# Patient Record
Sex: Female | Born: 1940 | Race: Black or African American | Hispanic: No | Marital: Single | Smoking: Former smoker
Health system: Southern US, Community
[De-identification: ages and names within clinical notes are randomized; demographics above are authoritative.]

## PROBLEM LIST (undated history)

## (undated) DIAGNOSIS — C719 Malignant neoplasm of brain, unspecified: Secondary | ICD-10-CM

## (undated) DIAGNOSIS — S8990XA Unspecified injury of unspecified lower leg, initial encounter: Secondary | ICD-10-CM

## (undated) DIAGNOSIS — E785 Hyperlipidemia, unspecified: Secondary | ICD-10-CM

## (undated) DIAGNOSIS — R569 Unspecified convulsions: Secondary | ICD-10-CM

## (undated) DIAGNOSIS — I10 Essential (primary) hypertension: Secondary | ICD-10-CM

## (undated) DIAGNOSIS — C3490 Malignant neoplasm of unspecified part of unspecified bronchus or lung: Secondary | ICD-10-CM

## (undated) HISTORY — DX: Malignant neoplasm of unspecified part of unspecified bronchus or lung: C34.90

## (undated) HISTORY — DX: Malignant neoplasm of brain, unspecified: C71.9

---

## 1981-05-24 DIAGNOSIS — S8990XA Unspecified injury of unspecified lower leg, initial encounter: Secondary | ICD-10-CM

## 1981-05-24 HISTORY — PX: ABDOMINAL HYSTERECTOMY: SHX81

## 1981-05-24 HISTORY — DX: Unspecified injury of unspecified lower leg, initial encounter: S89.90XA

## 2014-04-18 ENCOUNTER — Emergency Department (HOSPITAL_COMMUNITY): Payer: Medicare Other

## 2014-04-18 ENCOUNTER — Inpatient Hospital Stay (HOSPITAL_COMMUNITY)
Admission: EM | Admit: 2014-04-18 | Discharge: 2014-04-20 | DRG: 989 | Disposition: A | Discharge status: Home / Self Care | Payer: Medicare Other | Attending: Family Medicine | Admitting: Family Medicine

## 2014-04-18 ENCOUNTER — Encounter (HOSPITAL_COMMUNITY): Payer: Self-pay

## 2014-04-18 DIAGNOSIS — Z79899 Other long term (current) drug therapy: Secondary | ICD-10-CM | POA: Diagnosis not present

## 2014-04-18 DIAGNOSIS — R569 Unspecified convulsions: Secondary | ICD-10-CM

## 2014-04-18 DIAGNOSIS — R200 Anesthesia of skin: Secondary | ICD-10-CM

## 2014-04-18 DIAGNOSIS — I10 Essential (primary) hypertension: Secondary | ICD-10-CM | POA: Diagnosis not present

## 2014-04-18 DIAGNOSIS — Z87891 Personal history of nicotine dependence: Secondary | ICD-10-CM | POA: Diagnosis not present

## 2014-04-18 DIAGNOSIS — C801 Malignant (primary) neoplasm, unspecified: Secondary | ICD-10-CM | POA: Diagnosis not present

## 2014-04-18 DIAGNOSIS — E785 Hyperlipidemia, unspecified: Secondary | ICD-10-CM | POA: Diagnosis not present

## 2014-04-18 DIAGNOSIS — Z7982 Long term (current) use of aspirin: Secondary | ICD-10-CM | POA: Diagnosis not present

## 2014-04-18 DIAGNOSIS — C7931 Secondary malignant neoplasm of brain: Principal | ICD-10-CM | POA: Diagnosis present

## 2014-04-18 DIAGNOSIS — C799 Secondary malignant neoplasm of unspecified site: Secondary | ICD-10-CM

## 2014-04-18 DIAGNOSIS — Z9889 Other specified postprocedural states: Secondary | ICD-10-CM

## 2014-04-18 DIAGNOSIS — J984 Other disorders of lung: Secondary | ICD-10-CM

## 2014-04-18 HISTORY — DX: Essential (primary) hypertension: I10

## 2014-04-18 HISTORY — DX: Hyperlipidemia, unspecified: E78.5

## 2014-04-18 LAB — CBC WITH DIFFERENTIAL/PLATELET
BASOS PCT: 0 % (ref 0–1)
Basophils Absolute: 0 10*3/uL (ref 0.0–0.1)
EOS PCT: 4 % (ref 0–5)
Eosinophils Absolute: 0.4 10*3/uL (ref 0.0–0.7)
HEMATOCRIT: 36.1 % (ref 36.0–46.0)
HEMOGLOBIN: 12.5 g/dL (ref 12.0–15.0)
LYMPHS ABS: 2 10*3/uL (ref 0.7–4.0)
Lymphocytes Relative: 22 % (ref 12–46)
MCH: 25.4 pg — ABNORMAL LOW (ref 26.0–34.0)
MCHC: 34.6 g/dL (ref 30.0–36.0)
MCV: 73.4 fL — AB (ref 78.0–100.0)
MONOS PCT: 9 % (ref 3–12)
Monocytes Absolute: 0.8 10*3/uL (ref 0.1–1.0)
NEUTROS ABS: 6.1 10*3/uL (ref 1.7–7.7)
Neutrophils Relative %: 65 % (ref 43–77)
Platelets: 281 10*3/uL (ref 150–400)
RBC: 4.92 MIL/uL (ref 3.87–5.11)
RDW: 13.9 % (ref 11.5–15.5)
WBC: 9.3 10*3/uL (ref 4.0–10.5)

## 2014-04-18 LAB — COMPREHENSIVE METABOLIC PANEL
ALT: 11 U/L (ref 0–35)
AST: 14 U/L (ref 0–37)
Albumin: 3.1 g/dL — ABNORMAL LOW (ref 3.5–5.2)
Alkaline Phosphatase: 73 U/L (ref 39–117)
Anion gap: 13 (ref 5–15)
BILIRUBIN TOTAL: 0.3 mg/dL (ref 0.3–1.2)
BUN: 10 mg/dL (ref 6–23)
CHLORIDE: 100 meq/L (ref 96–112)
CO2: 27 meq/L (ref 19–32)
CREATININE: 0.77 mg/dL (ref 0.50–1.10)
Calcium: 8.5 mg/dL (ref 8.4–10.5)
GFR calc Af Amer: >90 mL/min (ref >90)
GFR, EST NON AFRICAN AMERICAN: 81 mL/min — AB (ref >90)
Glucose, Bld: 110 mg/dL — ABNORMAL HIGH (ref 70–99)
Potassium: 3.2 mEq/L — ABNORMAL LOW (ref 3.7–5.3)
Sodium: 140 mEq/L (ref 137–147)
Total Protein: 7.1 g/dL (ref 6.0–8.3)

## 2014-04-18 LAB — URINALYSIS, ROUTINE W REFLEX MICROSCOPIC
BILIRUBIN URINE: NEGATIVE
GLUCOSE, UA: NEGATIVE mg/dL
Hgb urine dipstick: NEGATIVE
KETONES UR: NEGATIVE mg/dL
Nitrite: NEGATIVE
Protein, ur: NEGATIVE mg/dL
Specific Gravity, Urine: 1.01 (ref 1.005–1.030)
Urobilinogen, UA: 0.2 mg/dL (ref 0.0–1.0)
pH: 6 (ref 5.0–8.0)

## 2014-04-18 LAB — URINE MICROSCOPIC-ADD ON

## 2014-04-18 LAB — TROPONIN I

## 2014-04-18 MED ORDER — LEVETIRACETAM IN NACL 1000 MG/100ML IV SOLN
1000.0000 mg | Freq: Once | INTRAVENOUS | Status: AC
  Administered 2014-04-18: 1000 mg via INTRAVENOUS
  Filled 2014-04-18: qty 100

## 2014-04-18 MED ORDER — ATORVASTATIN CALCIUM 10 MG PO TABS
10.0000 mg | ORAL_TABLET | Freq: Every day | ORAL | Status: DC
  Filled 2014-04-18: qty 1

## 2014-04-18 MED ORDER — HYDROCHLOROTHIAZIDE 25 MG PO TABS
25.0000 mg | ORAL_TABLET | Freq: Every day | ORAL | Status: DC
  Administered 2014-04-19 – 2014-04-20 (×2): 25 mg via ORAL
  Filled 2014-04-18 (×2): qty 1

## 2014-04-18 MED ORDER — LEVETIRACETAM 500 MG PO TABS
1000.0000 mg | ORAL_TABLET | Freq: Two times a day (BID) | ORAL | Status: DC
  Administered 2014-04-19 – 2014-04-20 (×4): 1000 mg via ORAL
  Filled 2014-04-18 (×5): qty 2

## 2014-04-18 MED ORDER — DEXAMETHASONE SODIUM PHOSPHATE 10 MG/ML IJ SOLN
10.0000 mg | Freq: Once | INTRAMUSCULAR | Status: AC
  Administered 2014-04-18: 10 mg via INTRAVENOUS
  Filled 2014-04-18: qty 1

## 2014-04-18 MED ORDER — SENNOSIDES-DOCUSATE SODIUM 8.6-50 MG PO TABS: 1.0000 | ORAL_TABLET | Freq: Every evening | ORAL | Status: DC | PRN

## 2014-04-18 MED ORDER — POTASSIUM CHLORIDE CRYS ER 20 MEQ PO TBCR
20.0000 meq | EXTENDED_RELEASE_TABLET | Freq: Once | ORAL | Status: AC
  Administered 2014-04-19: 20 meq via ORAL
  Filled 2014-04-18: qty 1

## 2014-04-18 MED ORDER — STROKE: EARLY STAGES OF RECOVERY BOOK
Freq: Once | Status: AC
Start: 2014-04-19 — End: 2014-04-19
  Administered 2014-04-19: 01:00:00
  Filled 2014-04-18: qty 1

## 2014-04-18 MED ORDER — VITAMIN D 1000 UNITS PO TABS
1000.0000 [IU] | ORAL_TABLET | Freq: Every day | ORAL | Status: DC
  Administered 2014-04-19 – 2014-04-20 (×2): 1000 [IU] via ORAL
  Filled 2014-04-18 (×2): qty 1

## 2014-04-18 NOTE — ED Notes (Signed)
Pt called for RN. Pt complaining of "feeling like she's about to start shaking." RN to bedside. Pt began to shake on left side. HR tachy in 140's. Pt responsive to commands, maintaining airway. MD to bedside. Once shaking subsided, pt alert/oriented x4. Able to recall event in detail. Pt denies any pain.

## 2014-04-18 NOTE — ED Provider Notes (Signed)
CSN: [id number]     Arrival date & time 04/18/14  1941 History   First MD Initiated Contact with Patient 04/18/14 1945     Chief Complaint  Patient presents with  . Numbness     (Consider location/radiation/quality/duration/timing/severity/associated sxs/prior Treatment) Patient is a 73 y.o. female presenting with neurologic complaint.  Neurologic Problem This is a new problem. The current episode started more than 1 month ago. The problem occurs constantly. The problem has been gradually worsening. Pertinent negatives include no abdominal pain, chest pain, congestion, coughing, fever, headaches or joint swelling.    Past Medical History  Diagnosis Date  . Hypertension   . Hyperlipidemia    History reviewed. No pertinent past surgical history. History reviewed. No pertinent family history. History  Substance Use Topics  . Smoking status: Former Research scientist (life sciences)  . Smokeless tobacco: Current User  . Alcohol Use: Yes     Comment: 2 oz/week   OB History    No data available     Review of Systems  Constitutional: Negative for fever and activity change.  HENT: Negative for congestion and facial swelling.   Eyes: Negative for discharge and redness.  Respiratory: Negative for cough and shortness of breath.   Cardiovascular: Negative for chest pain and palpitations.  Gastrointestinal: Negative for abdominal pain and abdominal distention.  Endocrine: Negative for polydipsia and polyuria.  Genitourinary: Negative for dysuria and menstrual problem.  Musculoskeletal: Negative for back pain and joint swelling.  Skin: Negative for color change and wound.  Neurological: Negative for dizziness, light-headedness and headaches.  All other systems reviewed and are negative.     Allergies  Review of patient's allergies indicates no known allergies.  Home Medications   Prior to Admission medications   Medication Sig Start Date End Date Taking? Authorizing Provider  aspirin 81 MG tablet  Take 81 mg by mouth daily.   Yes Historical Provider, MD  atorvastatin (LIPITOR) 10 MG tablet Take 10 mg by mouth daily. 02/18/14  Yes Historical Provider, MD  cholecalciferol (VITAMIN D) 1000 UNITS tablet Take 1,000 Units by mouth daily.   Yes Historical Provider, MD  hydrochlorothiazide (HYDRODIURIL) 25 MG tablet Take 25 mg by mouth daily.   Yes Historical Provider, MD   BP 105/88 mmHg  Pulse 81  Temp(Src) 98.2 F (36.8 C) (Oral)  Resp 17  SpO2 98% Physical Exam  Constitutional: She is oriented to person, place, and time. She appears well-developed and well-nourished.  HENT:  Head: Normocephalic and atraumatic.  Eyes: Conjunctivae and EOM are normal. Right eye exhibits no discharge. Left eye exhibits no discharge.  Cardiovascular: Normal rate and regular rhythm.   Pulmonary/Chest: Effort normal and breath sounds normal. No respiratory distress.  Abdominal: Soft. She exhibits no distension. There is no tenderness. There is no rebound.  Musculoskeletal: Normal range of motion. She exhibits no edema or tenderness.  Neurological: She is alert and oriented to person, place, and time.  No altered mental status, able to give full seemingly accurate history.  Face is symmetric, EOM's intact, pupils equal and reactive, vision intact, tongue and uvula midline without deviation Upper and Lower extremity motor 5/5, intact pain perception in distal extremities, 2+ reflexes in biceps, patella and achilles tendons. Abnormal sensation to left side of torso front and back. Finger to nose normal, heel to shin normal. Walks without assistance or evident ataxia.   Skin: Skin is warm and dry.  Nursing note and vitals reviewed.   ED Course  Procedures (including critical care time) Labs  Review Labs Reviewed  CBC WITH DIFFERENTIAL - Abnormal; Notable for the following:    MCV 73.4 (*)    MCH 25.4 (*)    All other components within normal limits  COMPREHENSIVE METABOLIC PANEL - Abnormal; Notable for the  following:    Potassium 3.2 (*)    Glucose, Bld 110 (*)    Albumin 3.1 (*)    GFR calc non Af Amer 81 (*)    All other components within normal limits  URINALYSIS, ROUTINE W REFLEX MICROSCOPIC - Abnormal; Notable for the following:    Leukocytes, UA TRACE (*)    All other components within normal limits  TROPONIN I  URINE MICROSCOPIC-ADD ON  HEMOGLOBIN A1C  LIPID PANEL    Imaging Review Dg Chest 2 View  04/18/2014   CLINICAL DATA:  Difficulty breathing  EXAM: CHEST  2 VIEW  COMPARISON:  April 10, 2014  FINDINGS: There is persistent left lower lobe consolidation as well as left base atelectasis. Right lung is clear. The heart size is normal. There is fullness in the hilar regions bilaterally, somewhat more on the left than on the right. There is atherosclerotic change in aorta. No bone lesions.  IMPRESSION: Persistent left lower lobe consolidation. Findings concerning for potential adenopathy. Given the persistence of the opacity in the left lower lobe and concern for potential adenopathy, contrast enhanced chest CT advised to assess for adenopathy as well as a possible endobronchial lesion on the left. Overall, there is no new opacity compared to prior study.   Electronically Signed   By: Lowella Grip M.D.   On: 04/18/2014 21:22   Ct Head Wo Contrast  04/18/2014   CLINICAL DATA:  Left sided tremors for past 5 hr.  EXAM: CT HEAD WITHOUT CONTRAST  TECHNIQUE: Contiguous axial images were obtained from the base of the skull through the vertex without intravenous contrast.  COMPARISON:  None.  FINDINGS: The ventricles are normal in size and configuration. 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 No other hemorrhage is seen. There is no mass, extra-axial fluid, or midline shift. There is a questionable second recent and possibly acute infarct in the posterior right Crohn radiata. There is small vessel  disease in the periventricular white matter more inferiorly, particularly involving both internal capsules in the anterior limb of the right external capsule. Bony calvarium appears intact. The mastoid air cells are clear.  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 Question second acute infarct in the posterior right corona radiata. No hemorrhage is seen in this area. There is patchy small vessel disease in the basal ganglia regions and inferior periventricular white matter bilaterally.  Critical Value/emergent results were called by telephone at the time of interpretation on 04/18/2014 at 9:32 pm to Dr. Merrily Pew , who verbally acknowledged these results.   Electronically Signed   By: Lowella Grip M.D.   On: 04/18/2014 21:33     EKG Interpretation   Date/Time:  Thursday April 18 2014 19:49:06 EST Ventricular Rate:  96 PR Interval:  198 QRS Duration: 92 QT Interval:  369 QTC Calculation: 466 R Axis:   67 Text Interpretation:  Sinus rhythm Atrial premature complex Consider left  atrial enlargement Borderline T abnormalities, lateral leads Baseline  wander in lead(s) I III aVL V2 Confirmed by Hazle Coca [id number]) on  04/18/2014 9:11:04 PM  MDM   Final diagnoses:  Numbness    73 yo F w/ htn and hld here with a couple months of left body change in sensation and two episodes of shaking of left body as well, both resolved with ASA, most recent today. No HA, pain, fevers, CP or other symptoms. Exam void of objective findings, as above. Labs and imaging ordered to r/o brain bleed/stroke/mass, also possibly atypical chest pain.  CT found to have left sided ring enhancing lesion read as acute infarct with hemorrhage, neurology consulted and felt it to be more like mass with surrounding edema. Patient had an episode of what appeared to be focal seizure activity in ED  resolved on it's own and no change in responsiveness. Neurology recommended keppra, decadron and medicine admission, those administered and patient admitted to medicine.      Merrily Pew, MD 04/19/14 [id number]  Quintella Reichert, MD 04/19/14 [phone]

## 2014-04-18 NOTE — ED Notes (Signed)
Per EMS: Beginning Nov 5, pt sudden onset numbness in left arm only, refused care. Today, tremors in L arm. No weakness, pain or tingling.

## 2014-04-18 NOTE — H&P (Signed)
[facility]                                                                                    Patient Demographics  Michelle Cobb, is a 73 y.o. female  CSN: [id number]  MRN: [id number]  DOB - [birth year]  Admit Date - 04/18/2014  Outpatient Primary MD for the patient is Woody Seller, MD   With History of -  Past Medical History  Diagnosis Date  . Hypertension   . Hyperlipidemia       History reviewed. No pertinent past surgical history.  in for   Chief Complaint  Patient presents with  . Numbness     HPI  Michelle Cobb  is a 73 y.o. female, past medical history significant for hypertension and hyperlipidemia presenting with left sided numbness and left arm shaking without any mental status changes, blurring of vision, chest pains or palpitations. No nausea or vomiting. No recent history of headaches or dizziness. CT of the brain showed lesions with vasogenic edema suggestive of brain metastasis, and discussed with neurology also suggested metastatic workup for a primary lesion. Chest x-ray showed left lower lobe abnormality . The episode lasted for around 5 minutes and was witnessed in the emergency room again. The patient recalled a similar episode in November 5 that went away and never recurred until today.    Review of Systems    In addition to the HPI above,  No Fever-chills, No Headache, No changes with Vision or hearing, No problems swallowing food or Liquids, No Chest pain, Cough or Shortness of Breath, No Abdominal pain, No Nausea or Vommitting, Bowel movements are regular, No Blood in stool or Urine, No dysuria, No new skin rashes or bruises, No new joints pains-aches,  No recent weight gain or loss, No polyuria, polydypsia or polyphagia, No significant Mental Stressors.  A full 10 point Review of Systems was done, except as stated above, all other Review of Systems were negative.   Social History History  Substance Use  Topics  . Smoking status: Former Research scientist (life sciences)  . Smokeless tobacco: Current User  . Alcohol Use: Yes     Comment: 2 oz/week     Family History Significant for coronary artery disease and alcoholism in her father  Prior to Admission medications   Medication Sig Start Date End Date Taking? Authorizing Provider  aspirin 81 MG tablet Take 81 mg by mouth daily.   Yes Historical Provider, MD  atorvastatin (LIPITOR) 10 MG tablet Take 10 mg by mouth daily. 02/18/14  Yes Historical Provider, MD  cholecalciferol (VITAMIN D) 1000 UNITS tablet Take 1,000 Units by mouth daily.   Yes Historical Provider, MD  hydrochlorothiazide (HYDRODIURIL) 25 MG tablet Take 25 mg by mouth daily.   Yes Historical Provider, MD    No Known Allergies  Physical Exam  Vitals  Blood pressure 126/74, pulse 74, temperature 98.3 F (36.8 C), temperature source Oral, resp. rate 19, SpO2 94 %.   1. General a very pleasant well developed well nourished female in no acute distress  2. Normal affect and insight, Not Suicidal or Homicidal, Awake Alert, Oriented X 3.  3. No F.N deficits, ALL  C.Nerves Intact, Strength 5/5 all 4 extremities, numbness still present in the left shoulder and arm  4. Ears and Eyes appear Normal, Conjunctivae clear, PERRLA. Moist Oral Mucosa.  5. Supple Neck, No JVD, No cervical lymphadenopathy appriciated, No Carotid Bruits.  6. Symmetrical Chest wall movement, Good air movement bilaterally, CTAB.  7. RRR, No Gallops, Rubs or Murmurs, No Parasternal Heave.  8. Positive Bowel Sounds, Abdomen Soft, Non tender, No organomegaly appriciated,No rebound -guarding or rigidity.  9.  No Cyanosis, Normal Skin Turgor, No Skin Rash or Bruise.  10. Good muscle tone,  joints appear normal , no effusions, Normal ROM.  11. No Palpable Lymph Nodes in Neck or Axillae    Data Review  CBC  Recent Labs Lab 04/18/14 2023  WBC 9.3  HGB 12.5  HCT 36.1  PLT 281  MCV 73.4*  MCH 25.4*  MCHC 34.6  RDW  13.9  LYMPHSABS 2.0  MONOABS 0.8  EOSABS 0.4  BASOSABS 0.0   ------------------------------------------------------------------------------------------------------------------  Chemistries   Recent Labs Lab 04/18/14 2023  NA 140  K 3.2*  CL 100  CO2 27  GLUCOSE 110*  BUN 10  CREATININE 0.77  CALCIUM 8.5  AST 14  ALT 11  ALKPHOS 73  BILITOT 0.3   ------------------------------------------------------------------------------------------------------------------ CrCl cannot be calculated (Unknown ideal weight.). ------------------------------------------------------------------------------------------------------------------ No results for input(s): TSH, T4TOTAL, T3FREE, THYROIDAB in the last 72 hours.  Invalid input(s): FREET3   Coagulation profile No results for input(s): INR, PROTIME in the last 168 hours. ------------------------------------------------------------------------------------------------------------------- No results for input(s): DDIMER in the last 72 hours. -------------------------------------------------------------------------------------------------------------------  Cardiac Enzymes  Recent Labs Lab 04/18/14 2023  TROPONINI <0.30   ------------------------------------------------------------------------------------------------------------------ Invalid input(s): POCBNP   ---------------------------------------------------------------------------------------------------------------  Urinalysis No results found for: COLORURINE, APPEARANCEUR, LABSPEC, PHURINE, GLUCOSEU, HGBUR, BILIRUBINUR, KETONESUR, PROTEINUR, UROBILINOGEN, NITRITE, LEUKOCYTESUR  ----------------------------------------------------------------------------------------------------------------     Imaging results:   Dg Chest 2 View  04/18/2014   CLINICAL DATA:  Difficulty breathing  EXAM: CHEST  2 VIEW  COMPARISON:  April 10, 2014  FINDINGS: There is persistent left  lower lobe consolidation as well as left base atelectasis. Right lung is clear. The heart size is normal. There is fullness in the hilar regions bilaterally, somewhat more on the left than on the right. There is atherosclerotic change in aorta. No bone lesions.  IMPRESSION: Persistent left lower lobe consolidation. Findings concerning for potential adenopathy. Given the persistence of the opacity in the left lower lobe and concern for potential adenopathy, contrast enhanced chest CT advised to assess for adenopathy as well as a possible endobronchial lesion on the left. Overall, there is no new opacity compared to prior study.   Electronically Signed   By: Lowella Grip M.D.   On: 04/18/2014 21:22   Ct Head Wo Contrast  04/18/2014   CLINICAL DATA:  Left sided tremors for past 5 hr.  EXAM: CT HEAD WITHOUT CONTRAST  TECHNIQUE: Contiguous axial images were obtained from the base of the skull through the vertex without intravenous contrast.  COMPARISON:  None.  FINDINGS: The ventricles are normal in size and configuration. 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 No other hemorrhage is seen. There is no mass, extra-axial fluid, or midline shift. There is a questionable second recent and possibly acute infarct in the posterior right Crohn radiata. There is small vessel disease in the periventricular white matter more inferiorly, particularly involving both internal capsules in the anterior limb of  the right external capsule. Bony calvarium appears intact. The mastoid air cells are clear.  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 Question second acute infarct in the posterior right corona radiata. No hemorrhage is seen in this area. There is patchy small vessel disease in the basal ganglia regions and  inferior periventricular white matter bilaterally.  Critical Value/emergent results were called by telephone at the time of interpretation on 04/18/2014 at 9:32 pm to Dr. Merrily Pew , who verbally acknowledged these results.   Electronically Signed   By: Lowella Grip M.D.   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 Left lower lobe lung lesion 3. History of hypertension  Plan  Place in telemetry Discussed with neurology Check MRI of brain, CT of the chest and abdomen and pelvis for primary evaluation Start Keppra 1000 mg by mouth twice a day Neurochecks every 4 hours Consult oncology in a.m.   DVT Prophylaxis SCDs  AM Labs Ordered, also please review Full Orders  Code Status full  Disposition Plan: Home  Time spent in minutes : 36 minutes  Condition GUARDED   @SIGNATURE @

## 2014-04-18 NOTE — Consult Note (Signed)
Reason for Consult: Numbness and left upper extremity twitching, and abnormal brain scan.  HPI:                                                                                                                                          Michelle Cobb is an 73 y.o. female with a history of hypertension and hyperlipidemia presenting with complaint of numbness involving left side of her face and left upper extremity for 4-6 weeks and recurrent episodes of involuntary twitching of her left upper extremity. She said no visual changes and no focal weakness. No mental status changes and been noted. She's also had no change in speech nor change in gait. CT scan of the head showed what appeared to be a mass lesion with surrounding vasogenic edema involving the left parietal temporal region, as well as a second area of probable vasogenic edema involving the right corona radiata. 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 She was also started on Decadron and milligrams IV as genic edema.  Past Medical History  Diagnosis Date  . Hypertension   . Hyperlipidemia     Surgical history: Hysterectomy in 1981.  Family history: Positive for myocardial infarction involving her father as well as malignancy of unknown type involving her mother. Family history is otherwise unremarkable.  Social History:  reports that she has quit smoking. She uses smokeless tobacco. She reports that she drinks alcohol. She reports that she does not use illicit drugs.  No Known Allergies  MEDICATIONS:                                                                                                                     I have reviewed the patient's current medications.   ROS:  History obtained from the patient  General ROS: negative for - chills, fatigue, fever,  night sweats, weight gain or weight loss Psychological ROS: negative for - behavioral disorder, hallucinations, memory difficulties, mood swings or suicidal ideation Ophthalmic ROS: negative for - blurry vision, double vision, eye pain or loss of vision ENT ROS: negative for - epistaxis, nasal discharge, oral lesions, sore throat, tinnitus or vertigo Allergy and Immunology ROS: negative for - hives or itchy/watery eyes Hematological and Lymphatic ROS: negative for - bleeding problems, bruising or swollen lymph nodes Endocrine ROS: negative for - galactorrhea, hair pattern changes, polydipsia/polyuria or temperature intolerance Respiratory ROS: negative for - cough, hemoptysis, shortness of breath or wheezing Cardiovascular ROS: negative for - chest pain, dyspnea on exertion, edema or irregular heartbeat Gastrointestinal ROS: negative for - abdominal pain, diarrhea, hematemesis, nausea/vomiting or stool incontinence Genito-Urinary ROS: negative for - dysuria, hematuria, incontinence or urinary frequency/urgency Musculoskeletal ROS: negative for - joint swelling or muscular weakness Neurological ROS: as noted in HPI Dermatological ROS: negative for rash and skin lesion changes   Blood pressure 126/74, pulse 74, temperature 98.3 F (36.8 C), temperature source Oral, resp. rate 19, SpO2 94 %. Appearance was that of a moderately obese elderly lady who was alert and in no acute distress. HEENT: Normal Neck was supple with full range of motion. Heart: Normal rate and rhythm; no murmur.  Neurologic Examination:                                                                                                      Mental Status: Alert, oriented, thought content appropriate.  Speech fluent without evidence of aphasia. Able to follow commands without difficulty. Cranial Nerves: II-Visual fields were normal. III/IV/VI-Pupils were equal and reacted. Extraocular movements were full and conjugate.     V/VII-no facial numbness and no facial weakness. VIII-normal. X-normal speech and symmetrical palatal movement. Motor: 5/5 bilaterally with normal tone and bulk Sensory: Normal throughout. Deep Tendon Reflexes: 1+ and symmetric. Plantars: Mute bilaterally Cerebellar: Normal finger-to-nose testing.  No results found for: CHOL  Results for orders placed or performed during the hospital encounter of 04/18/14 (from the past 48 hour(s))  CBC with Differential     Status: Abnormal   Collection Time: 04/18/14  8:23 PM  Result Value Ref Range   WBC 9.3 4.0 - 10.5 K/uL   RBC 4.92 3.87 - 5.11 MIL/uL   Hemoglobin 12.5 12.0 - 15.0 g/dL   HCT 36.1 36.0 - 46.0 %   MCV 73.4 (L) 78.0 - 100.0 fL   MCH 25.4 (L) 26.0 - 34.0 pg   MCHC 34.6 30.0 - 36.0 g/dL   RDW 13.9 11.5 - 15.5 %   Platelets 281 150 - 400 K/uL   Neutrophils Relative % 65 43 - 77 %   Lymphocytes Relative 22 12 - 46 %   Monocytes Relative 9 3 - 12 %   Eosinophils Relative 4 0 - 5 %   Basophils Relative 0 0 - 1 %   Neutro Abs 6.1 1.7 - 7.7 K/uL   Lymphs Abs 2.0 0.7 -  4.0 K/uL   Monocytes Absolute 0.8 0.1 - 1.0 K/uL   Eosinophils Absolute 0.4 0.0 - 0.7 K/uL   Basophils Absolute 0.0 0.0 - 0.1 K/uL   RBC Morphology TARGET CELLS   Comprehensive metabolic panel     Status: Abnormal   Collection Time: 04/18/14  8:23 PM  Result Value Ref Range   Sodium 140 137 - 147 mEq/L   Potassium 3.2 (L) 3.7 - 5.3 mEq/L   Chloride 100 96 - 112 mEq/L   CO2 27 19 - 32 mEq/L   Glucose, Bld 110 (H) 70 - 99 mg/dL   BUN 10 6 - 23 mg/dL   Creatinine, Ser 0.77 0.50 - 1.10 mg/dL   Calcium 8.5 8.4 - 10.5 mg/dL   Total Protein 7.1 6.0 - 8.3 g/dL   Albumin 3.1 (L) 3.5 - 5.2 g/dL   AST 14 0 - 37 U/L   ALT 11 0 - 35 U/L   Alkaline Phosphatase 73 39 - 117 U/L   Total Bilirubin 0.3 0.3 - 1.2 mg/dL   GFR calc non Af Amer 81 (L) >90 mL/min   GFR calc Af Amer >90 >90 mL/min    Comment: (NOTE) The eGFR has been calculated using the CKD EPI  equation. This calculation has not been validated in all clinical situations. eGFR's persistently <90 mL/min signify possible Chronic Kidney Disease.    Anion gap 13 5 - 15  Troponin I     Status: None   Collection Time: 04/18/14  8:23 PM  Result Value Ref Range   Troponin I <0.30 <0.30 ng/mL    Comment:        Due to the release kinetics of cTnI, a negative result within the first hours of the onset of symptoms does not rule out myocardial infarction with certainty. If myocardial infarction is still suspected, repeat the test at appropriate intervals.     Dg Chest 2 View  04/18/2014   CLINICAL DATA:  Difficulty breathing  EXAM: CHEST  2 VIEW  COMPARISON:  April 10, 2014  FINDINGS: There is persistent left lower lobe consolidation as well as left base atelectasis. Right lung is clear. The heart size is normal. There is fullness in the hilar regions bilaterally, somewhat more on the left than on the right. There is atherosclerotic change in aorta. No bone lesions.  IMPRESSION: Persistent left lower lobe consolidation. Findings concerning for potential adenopathy. Given the persistence of the opacity in the left lower lobe and concern for potential adenopathy, contrast enhanced chest CT advised to assess for adenopathy as well as a possible endobronchial lesion on the left. Overall, there is no new opacity compared to prior study.   Electronically Signed   By: Lowella Grip M.D.   On: 04/18/2014 21:22   Ct Head Wo Contrast  04/18/2014   CLINICAL DATA:  Left sided tremors for past 5 hr.  EXAM: CT HEAD WITHOUT CONTRAST  TECHNIQUE: Contiguous axial images were obtained from the base of the skull through the vertex without intravenous contrast.  COMPARISON:  None.  FINDINGS: The ventricles are normal in size and configuration. 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 No other hemorrhage is seen.  There is no mass, extra-axial fluid, or midline shift. There is a questionable second recent and possibly acute infarct in the posterior right Crohn radiata. There is small vessel disease in the periventricular white matter more inferiorly, particularly involving both  internal capsules in the anterior limb of the right external capsule. Bony calvarium appears intact. The mastoid air cells are clear.  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 Question second acute infarct in the posterior right corona radiata. No hemorrhage is seen in this area. There is patchy small vessel disease in the basal ganglia regions and inferior periventricular white matter bilaterally.  Critical Value/emergent results were called by telephone at the time of interpretation on 04/18/2014 at 9:32 pm to Dr. Merrily Pew , who verbally acknowledged these results.   Electronically Signed   By: Lowella Grip M.D.   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 Primary malignancy is undetermined at this point. Chest x-ray shows findings concerning for adenopathy. Patient smoked cigarettes for 55 years. CT scan of chest abdomen and pelvis with contrast is recommended, in addition to continuing Keppra 500 mg twice a day and 4 mg every 6 hours.  We will continue to follow this patient with you.  C.R. Nicole Kindred, MD [facility] [phone]  04/18/2014, 11:15 PM

## 2014-04-18 NOTE — ED Notes (Signed)
Pt transported to radiology.

## 2014-04-18 NOTE — ED Notes (Signed)
Pt is aware she needs a urine sample, but she just went to the bathroom

## 2014-04-19 ENCOUNTER — Inpatient Hospital Stay (HOSPITAL_COMMUNITY): Payer: Medicare Other

## 2014-04-19 ENCOUNTER — Encounter (HOSPITAL_COMMUNITY)
Admission: EM | Disposition: A | Discharge status: Home / Self Care | Payer: Self-pay | Source: Home / Self Care | Attending: Family Medicine

## 2014-04-19 DIAGNOSIS — I10 Essential (primary) hypertension: Secondary | ICD-10-CM

## 2014-04-19 DIAGNOSIS — C799 Secondary malignant neoplasm of unspecified site: Secondary | ICD-10-CM

## 2014-04-19 DIAGNOSIS — G939 Disorder of brain, unspecified: Secondary | ICD-10-CM

## 2014-04-19 DIAGNOSIS — R918 Other nonspecific abnormal finding of lung field: Secondary | ICD-10-CM

## 2014-04-19 DIAGNOSIS — R591 Generalized enlarged lymph nodes: Secondary | ICD-10-CM

## 2014-04-19 DIAGNOSIS — E785 Hyperlipidemia, unspecified: Secondary | ICD-10-CM

## 2014-04-19 HISTORY — PX: VIDEO BRONCHOSCOPY: SHX5072

## 2014-04-19 LAB — LIPID PANEL
Cholesterol: 134 mg/dL (ref 0–200)
HDL: 37 mg/dL — ABNORMAL LOW (ref >39)
LDL CALC: 89 mg/dL (ref 0–99)
Total CHOL/HDL Ratio: 3.6 RATIO
Triglycerides: 41 mg/dL (ref <150)
VLDL: 8 mg/dL (ref 0–40)

## 2014-04-19 LAB — HEMOGLOBIN A1C
Hgb A1c MFr Bld: 6.2 % — ABNORMAL HIGH (ref <5.7)
Mean Plasma Glucose: 131 mg/dL — ABNORMAL HIGH (ref <117)

## 2014-04-19 SURGERY — VIDEO BRONCHOSCOPY WITHOUT FLUORO
Anesthesia: Moderate Sedation | Laterality: Bilateral

## 2014-04-19 MED ORDER — FENTANYL CITRATE 0.05 MG/ML IJ SOLN
INTRAMUSCULAR | Status: DC | PRN
  Administered 2014-04-19 (×4): 25 ug via INTRAVENOUS

## 2014-04-19 MED ORDER — BUTAMBEN-TETRACAINE-BENZOCAINE 2-2-14 % EX AERO
1.0000 | INHALATION_SPRAY | Freq: Once | CUTANEOUS | Status: DC
  Filled 2014-04-19: qty 56

## 2014-04-19 MED ORDER — ATORVASTATIN CALCIUM 10 MG PO TABS
10.0000 mg | ORAL_TABLET | Freq: Every day | ORAL | Status: DC
  Administered 2014-04-19: 10 mg via ORAL
  Filled 2014-04-19: qty 1

## 2014-04-19 MED ORDER — PHENYLEPHRINE HCL 0.25 % NA SOLN
NASAL | Status: DC | PRN
  Administered 2014-04-19: 2 via NASAL

## 2014-04-19 MED ORDER — MIDAZOLAM HCL 5 MG/ML IJ SOLN
INTRAMUSCULAR | Status: AC
  Filled 2014-04-19: qty 2

## 2014-04-19 MED ORDER — LIDOCAINE HCL 2 % EX GEL
1.0000 "application " | Freq: Once | CUTANEOUS | Status: DC
  Filled 2014-04-19 (×2): qty 5

## 2014-04-19 MED ORDER — GADOBENATE DIMEGLUMINE 529 MG/ML IV SOLN
20.0000 mL | Freq: Once | INTRAVENOUS | Status: AC | PRN
  Administered 2014-04-19: 20 mL via INTRAVENOUS

## 2014-04-19 MED ORDER — IOHEXOL 300 MG/ML  SOLN: 50.0000 mL | Freq: Once | INTRAMUSCULAR | Status: AC | PRN

## 2014-04-19 MED ORDER — LIDOCAINE HCL (PF) 1 % IJ SOLN
INTRAMUSCULAR | Status: DC | PRN
  Administered 2014-04-19: 6 mL

## 2014-04-19 MED ORDER — PHENYLEPHRINE HCL 0.25 % NA SOLN
1.0000 | Freq: Four times a day (QID) | NASAL | Status: DC | PRN
  Filled 2014-04-19: qty 15

## 2014-04-19 MED ORDER — MIDAZOLAM HCL 10 MG/2ML IJ SOLN
INTRAMUSCULAR | Status: DC | PRN
  Administered 2014-04-19 (×3): 1 mg via INTRAVENOUS

## 2014-04-19 MED ORDER — PNEUMOCOCCAL VAC POLYVALENT 25 MCG/0.5ML IJ INJ
0.5000 mL | INJECTION | INTRAMUSCULAR | Status: AC
  Administered 2014-04-20: 0.5 mL via INTRAMUSCULAR
  Filled 2014-04-19: qty 0.5

## 2014-04-19 MED ORDER — LIDOCAINE HCL 2 % EX GEL
CUTANEOUS | Status: DC | PRN
  Administered 2014-04-19: 1

## 2014-04-19 MED ORDER — FENTANYL CITRATE 0.05 MG/ML IJ SOLN
INTRAMUSCULAR | Status: AC
  Filled 2014-04-19: qty 4

## 2014-04-19 MED ORDER — IOHEXOL 300 MG/ML  SOLN
100.0000 mL | Freq: Once | INTRAMUSCULAR | Status: AC | PRN
  Administered 2014-04-19: 100 mL via INTRAVENOUS

## 2014-04-19 MED ORDER — DEXAMETHASONE SODIUM PHOSPHATE 10 MG/ML IJ SOLN
10.0000 mg | Freq: Four times a day (QID) | INTRAMUSCULAR | Status: DC
  Administered 2014-04-19 – 2014-04-20 (×4): 10 mg via INTRAVENOUS
  Filled 2014-04-19 (×4): qty 1

## 2014-04-19 MED ORDER — SODIUM CHLORIDE 0.9 % IV SOLN
INTRAVENOUS | Status: DC
  Administered 2014-04-19: 16:00:00 via INTRAVENOUS

## 2014-04-19 NOTE — Progress Notes (Signed)
CRITICAL VALUE ALERT  Critical value received:  Left lower lobe mass concerning for primary lung malignancy. There is an additional adjacent satellite nodule within the left lower lobe as well as peripheral ground-glass and consolidative opacities. The opacities may represent postobstructive pneumonitis or additional sites of disease.  Date of notification:  04/19/14  Time of notification:  0922  Critical value read back:Yes.    Nurse who received alert:  Teresa Coombs, RN  MD notified (1st page):  Dr. Verlon Au  Time of first page:  0925  MD notified (2nd page):  Time of second page:  Responding MD:    Time MD responded:

## 2014-04-19 NOTE — Progress Notes (Signed)
Patient arrived to 4N09 from ED. Patient walked to bed from stretcher and reported no dizziness or weakness. Patient assessed and oriented to room and unit. Telemetry placed. Call bell in reach. Will continue to monitor patient. [name], RN

## 2014-04-19 NOTE — Progress Notes (Signed)
   Michelle Cobb YHC:[id number] DOB: 1940/12/10 DOA: 04/18/2014 PCP: Woody Seller, MD  Brief narrative: 73 y/o ?, H/o Htn/Hld admitted 11/26 with L sided numbness and L arm fasciculations w/o other Neuro sympotms.  Ct head suspicious for mass, CXR abnormal Ct chest confirmed lung mass  Past medical history-As per Problem list Chart reviewed as below- reviewed  Consultants:  Pulmonary  Oncology  Neurosurgery  Procedures:  Ct head  Ct abd/chest  MRI head  Antibiotics:  none   Subjective  Shocked at hearing news No c/o pain n/v/ No unilateral wekaness No sob     Objective    Interim History:   Telemetry:    Objective: Filed Vitals:   04/19/14 0004 04/19/14 0232 04/19/14 0431 04/19/14 1009  BP: 105/88 112/62 114/73 126/54  Pulse: 81 71 86 92  Temp: 98.2 F (36.8 C) 97.7 F (36.5 C) 98 F (36.7 C) 97.7 F (36.5 C)  TempSrc: Oral Oral Oral Oral  Resp: 17 16 18 18   SpO2: 98% 96% 97% 99%   No intake or output data in the 24 hours ending 04/19/14 1031  Exam:  General: eomi, ncat Cardiovascular:  s1 s 2no m/r/g Respiratory: clear Abdomen: soft Skin no le edema Neuro intact.  5/5 power Sensory intact Reflexes not tested   Data Reviewed: Basic Metabolic Panel:  Recent Labs Lab 04/18/14 2023  NA 140  K 3.2*  CL 100  CO2 27  GLUCOSE 110*  BUN 10  CREATININE 0.77  CALCIUM 8.5   Liver Function Tests:  Recent Labs Lab 04/18/14 2023  AST 14  ALT 11  ALKPHOS 73  BILITOT 0.3  PROT 7.1  ALBUMIN 3.1*   No results for input(s): LIPASE, AMYLASE in the last 168 hours. No results for input(s): AMMONIA in the last 168 hours. CBC:  Recent Labs Lab 04/18/14 2023  WBC 9.3  NEUTROABS 6.1  HGB 12.5  HCT 36.1  MCV 73.4*  PLT 281   Cardiac Enzymes:  Recent Labs Lab 04/18/14 2023  TROPONINI <0.30   BNP: Invalid input(s): POCBNP CBG: No results for input(s): GLUCAP in the last 168 hours.  No results found for this or  any previous visit (from the past 240 hour(s)).   Studies:              All Imaging reviewed and is as per above notation   Scheduled Meds: . [START ON 04/20/2014] atorvastatin  10 mg Oral q1800  . cholecalciferol  1,000 Units Oral Daily  . hydrochlorothiazide  25 mg Oral Daily  . levETIRAcetam  1,000 mg Oral BID  . [START ON 04/20/2014] pneumococcal 23 valent vaccine  0.5 mL Intramuscular Tomorrow-1000   Continuous Infusions:    Assessment/Plan: 1. Metastatic Ca-Have consulted Pulm for the Biospy, Onco. logy also consulted and await cb from  neurosurgery regarding plan of care and if any further workup of hemorrhagic mass needs to be done. At this stage as she is stable and has no specific deficit, we'll continue Decadron IV 10 mg 4 times a day, Keppra 1 thousand twice a day. Appreciate consultant input 2. Hypertension, continue HCTZ 25 daily 3. Hyperlipidemia continue Lipitor 10 daily   Code Full Family Communiction none at bedside  Dispositiinpatient   Verneita Griffes, MD  [facility] Pager [phone] 04/19/2014, 10:31 AM    LOS: 1 day

## 2014-04-19 NOTE — Consult Note (Signed)
Name: Michelle Cobb MRN: [id number] DOB: 22-Jul-1940    ADMISSION DATE:  04/18/2014 CONSULTATION DATE:  04/19/14  REFERRING MD :  Shellee Milo PCP- Kathryne Eriksson  CHIEF COMPLAINT:  Lung mass noted on CT  HISTORY OF PRESENT ILLNESS:  73 year old ex-smoker presented with sudden onset left-sided numbness and shaking of her left arm without loss of mental status. 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 MRI confirmed hemorrhagic mass lesion in the high right parietal lobe and another enhancing lesion in the left frontal lobe with significant vasogenic edema Chest x-ray suggested left lower lobe consolidation CT chest confirmed left lower lobe mass with lymphadenopathy, scattered pulmonary nodules, lesions in the liver and bone Of note she reports similar episode of shaking of her left arm on 11/5-she was seen by her PCP, and imaging of chest and left arm was reportedly negative She smoked about a pack per day for 50 years before she quit in October 2014 using an electronic cigarette.  STUDIES:  MRI 11/27 Hemorrhagic mass lesion in the high right parietal lobe measures 19 x 14 x 17 mm. Enhancing mass lesion in the posterior left frontal lobe white matter measures 15 x 16 x 13 mm.There is significant surrounding vasogenic edema  11/26 CT chest and abdomen- 6.0 x 5.3 x 4.7 cm left lower lobe mass, additional left lower lobe nodule 1.1 cm, additionalscattered bilateral pulmonary nodules   Left hilar and AP window adenopathy, Multiple hyperdense lesions within the liver  , Lucencies within the right and left ilium as well as the T2 vertebral body, 2 cm subcutaneous mass within the soft tissues overlying the inferior aspect of the sternum    PAST MEDICAL HISTORY :   has a past medical history of Hypertension and Hyperlipidemia.  has no past surgical history on file. Prior to Admission medications   Medication Sig Start Date End Date Taking?  Authorizing Provider  aspirin 81 MG tablet Take 81 mg by mouth daily.   Yes Historical Provider, MD  atorvastatin (LIPITOR) 10 MG tablet Take 10 mg by mouth daily. 02/18/14  Yes Historical Provider, MD  cholecalciferol (VITAMIN D) 1000 UNITS tablet Take 1,000 Units by mouth daily.   Yes Historical Provider, MD  hydrochlorothiazide (HYDRODIURIL) 25 MG tablet Take 25 mg by mouth daily.   Yes Historical Provider, MD   No Known Allergies  FAMILY HISTORY:  family history is not on file. SOCIAL HISTORY:  reports that she has quit smoking. She uses smokeless tobacco. She reports that she drinks alcohol. She reports that she does not use illicit drugs.  REVIEW OF SYSTEMS:   Constitutional: Negative for fever, chills, weight loss, malaise/fatigue and diaphoresis.  HENT: Negative for hearing loss, ear pain, nosebleeds, congestion, sore throat, neck pain, tinnitus and ear discharge.   Eyes: Negative for blurred vision, double vision, photophobia, pain, discharge and redness.  Respiratory: Negative for cough, hemoptysis, sputum production, shortness of breath, wheezing and stridor.   Cardiovascular: Negative for chest pain, palpitations, orthopnea, claudication, leg swelling and PND.  Gastrointestinal: Negative for heartburn, nausea, vomiting, abdominal pain, diarrhea, constipation, blood in stool and melena.  Genitourinary: Negative for dysuria, urgency, frequency, hematuria and flank pain.  Musculoskeletal: Negative for myalgias, back pain, joint pain and falls.  Skin: Negative for itching and rash.  Neurological: Negative for dizziness, tingling, tremors, sensory change, speech change, focal weakness, seizures, loss of consciousness, weakness and headaches.  Endo/Heme/Allergies: Negative for environmental allergies and polydipsia.  Does not bruise/bleed easily.  SUBJECTIVE:   VITAL SIGNS: Temp:  [97.7 F (36.5 C)-98.3 F (36.8 C)] 98.3 F (36.8 C) (11/27 1322) Pulse Rate:  [71-97] 87 (11/27  1322) Resp:  [13-19] 18 (11/27 1322) BP: (101-151)/(54-88) 101/56 mmHg (11/27 1322) SpO2:  [94 %-99 %] 96 % (11/27 1322)  PHYSICAL EXAMINATION: Gen. Pleasant, well-nourished, in no distress, normal affect ENT - no lesions, no post nasal drip Neck: No JVD, no thyromegaly, no carotid bruits Lungs: no use of accessory muscles, no dullness to percussion, clear without rales or rhonchi  Cardiovascular: Rhythm regular, heart sounds  normal, no murmurs, no peripheral edema Abdomen: soft and non-tender, no hepatosplenomegaly, BS normal. Musculoskeletal: No deformities, no cyanosis or clubbing Neuro:  alert, non focal Skin:  Warm, no lesions/ rash    Recent Labs Lab 04/18/14 2023  NA 140  K 3.2*  CL 100  CO2 27  BUN 10  CREATININE 0.77  GLUCOSE 110*    Recent Labs Lab 04/18/14 2023  HGB 12.5  HCT 36.1  WBC 9.3  PLT 281   Dg Chest 2 View  04/18/2014   CLINICAL DATA:  Difficulty breathing  EXAM: CHEST  2 VIEW  COMPARISON:  April 10, 2014  FINDINGS: There is persistent left lower lobe consolidation as well as left base atelectasis. Right lung is clear. The heart size is normal. There is fullness in the hilar regions bilaterally, somewhat more on the left than on the right. There is atherosclerotic change in aorta. No bone lesions.  IMPRESSION: Persistent left lower lobe consolidation. Findings concerning for potential adenopathy. Given the persistence of the opacity in the left lower lobe and concern for potential adenopathy, contrast enhanced chest CT advised to assess for adenopathy as well as a possible endobronchial lesion on the left. Overall, there is no new opacity compared to prior study.   Electronically Signed   By: Lowella Grip M.D.   On: 04/18/2014 21:22   Ct Head Wo Contrast  04/18/2014   CLINICAL DATA:  Left sided tremors for past 5 hr.  EXAM: CT HEAD WITHOUT CONTRAST  TECHNIQUE: Contiguous axial images were obtained from the base of the skull through the vertex  without intravenous contrast.  COMPARISON:  None.  FINDINGS: The ventricles are normal in size and configuration. 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 No other hemorrhage is seen. There is no mass, extra-axial fluid, or midline shift. There is a questionable second recent and possibly acute infarct in the posterior right Crohn radiata. There is small vessel disease in the periventricular white matter more inferiorly, particularly involving both internal capsules in the anterior limb of the right external capsule. Bony calvarium appears intact. The mastoid air cells are clear.  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 Question second acute infarct in the posterior right corona radiata. No hemorrhage is seen in this area. There is patchy small vessel disease in the basal ganglia regions and inferior periventricular white matter bilaterally.  Critical Value/emergent results were called by telephone at the time of interpretation on 04/18/2014 at 9:32 pm to Dr. Merrily Pew , who verbally acknowledged these results.   Electronically Signed   By: Lowella Grip M.D.   On: 04/18/2014 21:33   Ct Chest W Contrast  04/19/2014   CLINICAL DATA:  Patient with new brain lesion, suspected  metastatic disease. Evaluate for primary malignancy. Patient initially presented for partial seizures.  EXAM: CT CHEST, ABDOMEN, AND PELVIS WITH CONTRAST  TECHNIQUE: Multidetector CT imaging of the chest, abdomen and pelvis was performed following the standard protocol during bolus administration of intravenous contrast.  CONTRAST:  132m OMNIPAQUE IOHEXOL 300 MG/ML  SOLN  COMPARISON:  None.  FINDINGS: CT CHEST FINDINGS  Visualized thyroid is unremarkable. No enlarged axillary lymphadenopathy. There is a 1.5 cm AP window lymph  node (image 26; series 2). Left hilar lymphadenopathy measuring up to 1.6 cm (image 34; series 2).  There is a 6.0 x 5.3 x 4.7 cm left lower lobe mass which has broad-based abutment to the left aspect of the mediastinum. This mass causes occlusion of the distal aspect of the left lower lobe bronchi. There is patchy ground-glass opacity within the peripheral aspect of the left lower lobe. There is an additional left lower lobe nodule measuring 1.1 cm (image 44; series 2).  Additional scattered bilateral pulmonary nodules are detailed as follows including in 8 mm right upper lobe nodule (image 27; series 3); a 5 mm right middle lobe nodule (image 49; series 3) ; a 1.1 cm ground-glass nodule within the right lower lobe (image 49; series 3) ; 4 mm left upper lobe pulmonary nodule (image 22; series 3); and a 5 mm right lower lobe nodule (image 41; series 3). Two adjacent subpleural opacities representing either focal nodules or atelectasis within the left lower lobe (image 27; series 2) and (image 32; series 2). Scattered centrilobular emphysematous change. Left lower hemi thorax pleural thickening versus fluid.  CT ABDOMEN AND PELVIS FINDINGS  Hepatobiliary: There is a 1 cm hyperenhancing lesion within the hepatic dome (image 54; series 2 and image 37; series 5). Additionally there is suggestion of a 1.5 cm lesion within the right hepatic lobe (image 70; series 2). Suggestion of an additional hyperdense lesion within the hepatic dome measuring 1.3 cm (image 60; series 2). Multiple gallstones. No gallbladder wall thickening.  Pancreas: Unremarkable  Spleen: Unremarkable  Adrenals/Urinary Tract: 6 cm cyst off the interpolar region of the left kidney. 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 2 mm nonobstructing stone within the interpolar region of the right kidney (image 48; series 5). No ureterolithiasis. No hydronephrosis.   Stomach/Bowel: No abnormal bowel wall thickening or evidence for bowel obstruction. The appendix is normal. No free fluid or free intraperitoneal air.  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 Scattered calcified atherosclerotic plaque involving the abdominal aorta. Focal dilatation of the right common iliac artery measuring 2 cm.  Reproductive: Status post hysterectomy.  Other: There is a 2 cm subcutaneous mass within the soft tissues overlying the inferior aspect of the sternum (image 36; series 2). Fat containing periumbilical hernia.  Musculoskeletal: Small patchy lucencies within the left and right ilium measuring up to 1.1 cm within the left ilium (image 95; series 2). Additionally there is a 10 mm patchy lucent lesion within T2 vertebral body (image 9; series 2).  IMPRESSION: 1. Left lower lobe mass concerning for primary lung malignancy. There is an additional adjacent satellite nodule within the left lower lobe as well as peripheral ground-glass and consolidative opacities. The opacities may represent postobstructive pneumonitis or additional sites of disease. Small left-sided pleural effusion, malignant effusion is not excluded. 2. Left hilar and AP window adenopathy concerning for metastatic disease. 3. Multiple additional bilateral pulmonary nodules as detailed above. In  the setting of primary lung malignancy, metastatic disease is of concern. 4. Multiple hyperdense lesions within the liver as detailed above which may potentially represent hemangiomas or perfusional anomalies however given primary pulmonary malignancy, metastatic disease is not excluded. Attention on followup or dedicated evaluation with MRI in the nonacute setting is recommended. 5. Lucencies within the right and left ilium as well as the T2 vertebral body, nonspecific, however raising the possibility of osseous metastatic disease. 6. Cholelithiasis 7. Nonobstructing stone interpolar region right kidney.  8. Infrarenal abdominal aortic ectasia and focal dilatation of the right common iliac artery. 9. There is a 2 cm subcutaneous mass within the soft tissues overlying the inferior aspect of the sternum (image 36; series 2). This is favored to represent a sebaceous cyst however clinical correlation is recommended. These results will be called to the ordering clinician or representative by the Radiologist Assistant, and communication documented in the PACS or zVision Dashboard.   Electronically Signed   By: Lovey Newcomer M.D.   On: 04/19/2014 09:13   Mr Jeri Cos BJ Contrast  04/19/2014   CLINICAL DATA:  Numbness left-sided face and left upper extremity. Involuntary movements in the left upper extremity. Abnormal CT scan.  EXAM: MRI HEAD WITHOUT AND WITH CONTRAST  TECHNIQUE: Multiplanar, multiecho pulse sequences of the brain and surrounding structures were obtained without and with intravenous contrast.  CONTRAST:  90m MULTIHANCE GADOBENATE DIMEGLUMINE 529 MG/ML IV SOLN  COMPARISON:  CT head without contrast 04/18/2014.  FINDINGS: Enhancing mass lesion is present within the high right parietal lobe, likely involving the cortex. The lesion measures 19 x 14 x 17 mm. There is significant surrounding vasogenic edema. There is some hemorrhage associated with this lesion is well. Diffusion signal is likely related to susceptibility in the hemorrhage.  A lesion in the posterior left frontal lobe white matter measures 15 x 16 x 13 mm with significant surrounding vasogenic edema. There is no hemorrhage associated with this lesion.  Moderate generalized atrophy and periventricular white matter changes are evident bilaterally. No significant midline shift is present. The ventricles are of normal size. No significant extraaxial fluid collection is present.  Flow is present in the major intracranial arteries. The globes and orbits are intact. The paranasal sinuses and mastoid air cells are clear.  IMPRESSION: 1. Hemorrhagic mass  lesion in the high right parietal lobe measures 19 x 14 x 17 mm. 2. Enhancing mass lesion in the posterior left frontal lobe white matter measures 15 x 16 x 13 mm. 3. There is significant surrounding vasogenic edema with both lesions. These are most compatible with metastases. 4. Moderate generalized atrophy and diffuse white matter disease. This likely reflects the sequelae of chronic microvascular ischemia.   Electronically Signed   By: CLawrence SantiagoM.D.   On: 04/19/2014 10:01   Ct Abdomen Pelvis W Contrast  04/19/2014   CLINICAL DATA:  Patient with new brain lesion, suspected metastatic disease. Evaluate for primary malignancy. Patient initially presented for partial seizures.  EXAM: CT CHEST, ABDOMEN, AND PELVIS WITH CONTRAST  TECHNIQUE: Multidetector CT imaging of the chest, abdomen and pelvis was performed following the standard protocol during bolus administration of intravenous contrast.  CONTRAST:  1070mOMNIPAQUE IOHEXOL 300 MG/ML  SOLN  COMPARISON:  None.  FINDINGS: CT CHEST FINDINGS  Visualized thyroid is unremarkable. No enlarged axillary lymphadenopathy. There is a 1.5 cm AP window lymph node (image 26; series 2). Left hilar lymphadenopathy measuring up to 1.6 cm (image 34; series 2).  There  is a 6.0 x 5.3 x 4.7 cm left lower lobe mass which has broad-based abutment to the left aspect of the mediastinum. This mass causes occlusion of the distal aspect of the left lower lobe bronchi. There is patchy ground-glass opacity within the peripheral aspect of the left lower lobe. There is an additional left lower lobe nodule measuring 1.1 cm (image 44; series 2).  Additional scattered bilateral pulmonary nodules are detailed as follows including in 8 mm right upper lobe nodule (image 27; series 3); a 5 mm right middle lobe nodule (image 49; series 3) ; a 1.1 cm ground-glass nodule within the right lower lobe (image 49; series 3) ; 4 mm left upper lobe pulmonary nodule (image 22; series 3); and a 5 mm  right lower lobe nodule (image 41; series 3). Two adjacent subpleural opacities representing either focal nodules or atelectasis within the left lower lobe (image 27; series 2) and (image 32; series 2). Scattered centrilobular emphysematous change. Left lower hemi thorax pleural thickening versus fluid.  CT ABDOMEN AND PELVIS FINDINGS  Hepatobiliary: There is a 1 cm hyperenhancing lesion within the hepatic dome (image 54; series 2 and image 37; series 5). Additionally there is suggestion of a 1.5 cm lesion within the right hepatic lobe (image 70; series 2). Suggestion of an additional hyperdense lesion within the hepatic dome measuring 1.3 cm (image 60; series 2). Multiple gallstones. No gallbladder wall thickening.  Pancreas: Unremarkable  Spleen: Unremarkable  Adrenals/Urinary Tract: 6 cm cyst off the interpolar region of the left kidney. 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 2 mm nonobstructing stone within the interpolar region of the right kidney (image 48; series 5). No ureterolithiasis. No hydronephrosis.  Stomach/Bowel: No abnormal bowel wall thickening or evidence for bowel obstruction. The appendix is normal. No free fluid or free intraperitoneal air.  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 Scattered calcified atherosclerotic plaque involving the abdominal aorta. Focal dilatation of the right common iliac artery measuring 2 cm.  Reproductive: Status post hysterectomy.  Other: There is a 2 cm subcutaneous mass within the soft tissues overlying the inferior aspect of the sternum (image 36; series 2). Fat containing periumbilical hernia.  Musculoskeletal: Small patchy lucencies within the left and right ilium measuring up to 1.1 cm within the left ilium (image 95; series 2). Additionally there is a 10 mm patchy lucent lesion within T2 vertebral body (image 9; series 2).  IMPRESSION: 1.  Left lower lobe mass concerning for primary lung malignancy. There is an additional adjacent satellite nodule within the left lower lobe as well as peripheral ground-glass and consolidative opacities. The opacities may represent postobstructive pneumonitis or additional sites of disease. Small left-sided pleural effusion, malignant effusion is not excluded. 2. Left hilar and AP window adenopathy concerning for metastatic disease. 3. Multiple additional bilateral pulmonary nodules as detailed above. In the setting of primary lung malignancy, metastatic disease is of concern. 4. Multiple hyperdense lesions within the liver as detailed above which may potentially represent hemangiomas or perfusional anomalies however given primary pulmonary malignancy, metastatic disease is not excluded. Attention on followup or dedicated evaluation with MRI in the nonacute setting is recommended. 5. Lucencies within the right and left ilium as well as the T2 vertebral body, nonspecific, however raising the possibility of osseous metastatic disease. 6. Cholelithiasis 7. Nonobstructing stone interpolar region right kidney. 8. Infrarenal abdominal aortic ectasia and focal dilatation of the right common iliac  artery. 9. There is a 2 cm subcutaneous mass within the soft tissues overlying the inferior aspect of the sternum (image 36; series 2). This is favored to represent a sebaceous cyst however clinical correlation is recommended. These results will be called to the ordering clinician or representative by the Radiologist Assistant, and communication documented in the PACS or zVision Dashboard.   Electronically Signed   By: Lovey Newcomer M.D.   On: 04/19/2014 09:13    ASSESSMENT / PLAN:  Left lower lobe lung mass , but likely endobronchial component . Mediastinal and left hilar lymphadenopathy . 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  The various options of biopsy including bronchoscopy, CT guided needle aspiration and surgical biopsy were discussed.The risks of each procedure including coughing, bleeding and the  chances of lung puncture requiring chest tube were discussed in great detail. The benefits & alternatives including serial follow up were also discussed.  I have also asked radiation oncology to see the patient while awaiting biopsy results   Kara Mead MD. [facility]. [facility] Pager [phone] If no response call [phone]    04/19/2014, 1:47 PM

## 2014-04-19 NOTE — Progress Notes (Signed)
Video bronchoscopy performed Intervention bronchial washings Intervention bronchial brushings Intervention bronchial biopsy Pt tolerated well  Wyvonne Carda David RRT  

## 2014-04-19 NOTE — Consult Note (Signed)
[facility] CONSULT NOTE  Referral from: Dr.  Arnette Norris    Patient Care Team: Christain Sacramento, MD as PCP - General (Family Medicine)  CHIEF COMPLAINTS/PURPOSE OF CONSULTATION:  Lung mass and brain lesions   HISTORY OF PRESENTING ILLNESS:  [name] is a 73 year old African-American female with past medical history of hypertension, dyslipidemia, heavy smoking, presented to with left side numbness for over a month, and a possible seizure yesterday, and was admitted yesterday. I was asked by Dr. Dewaine Conger to see her for probable metastatic lung cancer.  She noticed a left arm numbness for the past several weeks, no significant weakness. No significant cough, chest pain, hemoptysis, or dyspnea. Her energy level and appetite has not been changed lately. She reports an episode of shaking of her left arm and shoulder for a few minutes as on 03/28/2014 and again yesterday. No loss of consciousness. She presented to [facility] emergency room yesterday. MRI of brain reviewed to brain lesions at the right parietal and left frontal lobe with significant surrounding edema. CT of the chest reviewed large left lower lobe mass, hilar and AP window adenopathy. She underwent a bronchoscopy by Dr. Mickeal Needy this afternoon, no endobronchial lesion was found, transbronchial needle biopsy of the left lower lobe mass was obtained. Past is still pending at this point.  She was seen by neurology service and started on Keppra. She was also put on dexamethasone. She states her left arm numbness is stable, no more episodes of seizure since her admission. She was seen by radiation oncologist Dr. [name] today.  MEDICAL HISTORY:  Past Medical History  Diagnosis Date  . Hypertension   . Hyperlipidemia     SURGICAL HISTORY: History reviewed. No pertinent past surgical history.  SOCIAL HISTORY: History   Social History  . Marital Status: Single    Spouse Name: N/A    Number of Children: N/A  . Years of  Education: N/A   She has one son who lives in [place] area. She has multiple local friends    Occupational History  . Not on file.   Social History Main Topics  . Smoking status: Former Smokerwith 1 pack daily for 50 years. She quit 1 year ago.   . Smokeless tobacco: Current User  . Alcohol Use: Yes     Comment: 2 oz/week  . Drug Use: No  . Sexual Activity: Not on file   Other Topics Concern  . Not on file   Social History Narrative  . No narrative on file    FAMILY HISTORY: History reviewed. No pertinent family history.  ALLERGIES:  has No Known Allergies.  MEDICATIONS:  Current Facility-Administered Medications  Medication Dose Route Frequency Provider Last Rate Last Dose  . 0.9 %  sodium chloride infusion   Intravenous Continuous Rigoberto Noel, MD 10 mL/hr at 04/19/14 1541    . [START ON 04/20/2014] atorvastatin (LIPITOR) tablet 10 mg  10 mg Oral q1800 Nita Sells, MD      . butamben-tetracaine-benzocaine (CETACAINE) spray 1 spray  1 spray Topical Once Kara Mead V, MD      . cholecalciferol (VITAMIN D) tablet 1,000 Units  1,000 Units Oral Daily Merton Border, MD   1,000 Units at 04/19/14 [phone]  . dexamethasone (DECADRON) injection 10 mg  10 mg Intravenous 4 times per day Nita Sells, MD   10 mg at 04/19/14 1314  . hydrochlorothiazide (HYDRODIURIL) tablet 25 mg  25 mg Oral Daily Merton Border, MD  25 mg at 04/19/14 0943  . iohexol (OMNIPAQUE) 300 MG/ML solution 50 mL  50 mL Oral Once PRN Medication Radiologist, MD      . levETIRAcetam (KEPPRA) tablet 1,000 mg  1,000 mg Oral BID Merton Border, MD   1,000 mg at 04/19/14 0943  . lidocaine (XYLOCAINE) 2 % jelly 1 application  1 application Topical Once Kara Mead V, MD      . phenylephrine (NEO-SYNEPHRINE) 0.25 % nasal spray 1 spray  1 spray Each Nare Q6H PRN Rigoberto Noel, MD      . Derrill Memo ON 04/20/2014] pneumococcal 23 valent vaccine (PNU-IMMUNE) injection 0.5 mL  0.5 mL Intramuscular Tomorrow-1000 Merton Border,  MD      . senna-docusate (Senokot-S) tablet 1 tablet  1 tablet Oral QHS PRN Merton Border, MD        REVIEW OF SYSTEMS:   Constitutional: Denies fevers, chills or abnormal night sweats no weight loss  Eyes: Denies blurriness of vision, double vision or watery eyes Ears, nose, mouth, throat, and face: Denies mucositis or sore throat Respiratory: Denies cough, dyspnea or wheezes Cardiovascular: Denies palpitation, chest discomfort or lower extremity swelling Gastrointestinal:  Denies nausea, heartburn or change in bowel habits Skin: Denies abnormal skin rashes Lymphatics: Denies new lymphadenopathy or easy bruising Neurological: positive for left arm numbness, no weakness.  Behavioral/Psych: Mood is stable, no new changes  All other systems were reviewed with the patient and are negative.   PHYSICAL EXAMINATION: ECOG PERFORMANCE STATUS: 1  Filed Vitals:   04/19/14 1825  BP: 116/58  Pulse: 86  Temp: 98.7 F (37.1 C)  Resp: 18   There were no vitals filed for this visit.  GENERAL:alert, no distress and comfortable SKIN: skin color, texture, turgor are normal, no rashes or significant lesions EYES: normal, conjunctiva are pink and non-injected, sclera clear OROPHARYNX:no exudate, no erythema and lips, buccal mucosa, and tongue normal  NECK: supple, thyroid normal size, non-tender, without nodularity LYMPH:  no palpable lymphadenopathy in the cervical, axillary or inguinal LUNGS: clear to auscultation and percussion with normal breathing effort HEART: regular rate & rhythm and no murmurs and no lower extremity edema ABDOMEN:abdomen soft, non-tender and normal bowel sounds Musculoskeletal:no cyanosis of digits and no clubbing  PSYCH: alert & oriented x 3 with fluent speech NEURO: no focal motor/sensory deficits  LABORATORY DATA:  I have reviewed the data as listed Lab Results  Component Value Date   WBC 9.3 04/18/2014   HGB 12.5 04/18/2014   HCT 36.1 04/18/2014   MCV 73.4*  04/18/2014   PLT 281 04/18/2014    Recent Labs  04/18/14 2023  NA 140  K 3.2*  CL 100  CO2 27  GLUCOSE 110*  BUN 10  CREATININE 0.77  CALCIUM 8.5  GFRNONAA 81*  GFRAA >90  PROT 7.1  ALBUMIN 3.1*  AST 14  ALT 11  ALKPHOS 73  BILITOT 0.3    RADIOGRAPHIC STUDIES: I have personally reviewed the radiological images as listed and agreed with the findings in the report.   Ct Chest abdomen and the pelvis W Contrast  04/19/2014    IMPRESSION: 1. Left lower lobe mass concerning for primary lung malignancy. There is an additional adjacent satellite nodule within the left lower lobe as well as peripheral ground-glass and consolidative opacities. The opacities may represent postobstructive pneumonitis or additional sites of disease. Small left-sided pleural effusion, malignant effusion is not excluded. 2. Left hilar and AP window adenopathy concerning for metastatic disease. 3. Multiple additional bilateral  pulmonary nodules as detailed above. In the setting of primary lung malignancy, metastatic disease is of concern. 4. Multiple hyperdense lesions within the liver as detailed above which may potentially represent hemangiomas or perfusional anomalies however given primary pulmonary malignancy, metastatic disease is not excluded. Attention on followup or dedicated evaluation with MRI in the nonacute setting is recommended. 5. Lucencies within the right and left ilium as well as the T2 vertebral body, nonspecific, however raising the possibility of osseous metastatic disease. 6. Cholelithiasis 7. Nonobstructing stone interpolar region right kidney. 8. Infrarenal abdominal aortic ectasia and focal dilatation of the right common iliac artery. 9. There is a 2 cm subcutaneous mass within the soft tissues overlying the inferior aspect of the sternum (image 36; series 2). This is favored to represent a sebaceous cyst however clinical correlation is recommended. These results will be called to the ordering  clinician or representative by the Radiologist Assistant, and communication documented in the PACS or zVision Dashboard.   Electronically Signed   By: Lovey Newcomer M.D.   On: 04/19/2014 09:13   Mr Jeri Cos PX Contrast  04/19/2014     IMPRESSION: 1. Hemorrhagic mass lesion in the high right parietal lobe measures 19 x 14 x 17 mm. 2. Enhancing mass lesion in the posterior left frontal lobe white matter measures 15 x 16 x 13 mm. 3. There is significant surrounding vasogenic edema with both lesions. These are most compatible with metastases. 4. Moderate generalized atrophy and diffuse white matter disease. This likely reflects the sequelae of chronic microvascular ischemia.   Electronically Signed   By: Lawrence Santiago M.D.   On: 04/19/2014 10:01      ASSESSMENT & PLAN:   A 73 year old African-American female with past medical history of hypertension, dyslipidemia, have a smoking, who presents left-sided numbness and possible seizure episodes. A CT of chest reviewed a large left upper lobe lung mass, adenopathy, and the 2 brain lesions with significant surrounding edema. 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 She has had bronchoscopy and left lower lobe lung mass transbronchial biopsy, results is still pending.  I reviewed her scan findings and likely diagnosis of metastatic lung cancer. I recommend brain radiation therapy first, preferably by stereotactic brain radiation. She was seen by Dr. [name] today and likely will start treatment soon. I agree with dexamethasone and Keppra.  I discussed furthersystemic treatment options after her brain radiation. I would like to obtain a PET scan as an outpatient to ruled out other small distant metastasis. I will like to see her in my clinic when I have the biopsy results back next week. Based on her histology of her lung cancer, I would recommend appropriate further molecular testing and systemic therapy.  I'll set up her  follow-up appointment with me in our [place] next week. I given her my contact information. She is likely going home tomorrow.  All questions were answered. The patient knows to call the clinic with any problems, questions or concerns. I spent about 40 minutes in the consultation, more than 50% time was spent on face-to-face counseling.     Truitt Merle, MD 04/19/2014 6:48 PM

## 2014-04-19 NOTE — Evaluation (Signed)
Physical Therapy Evaluation Patient Details Name: Michelle Cobb MRN: [id number] DOB: [birth year] Today's Date: 04/19/2014   History of Present Illness  Patient is a 73 y/o female with PMH of HTn and HLD presenting with complaints of numbness involving left side of her face and left upper extremity for 4-6 weeks and recurrent episodes of involuntary twitching of her left upper extremity.  CT scan of the head showed what appeared to be a mass lesion with surrounding vasogenic edema involving the left parietal temporal region, as well as a second area of probable vasogenic edema involving the right corona radiata likely metastatic. 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     Clinical Impression  Patient presents with mild balance deficits most notable during higher level balance challenges and deconditioning from hospitalization. Pt very active PTA and needs to be independent with mobility prior to return home as pt lives alone. Pt would benefit from skilled PT to improve balance, gait and overall safe mobility so pt can maximize independence and return to PLOF.    Follow Up Recommendations No PT follow up;Supervision - Intermittent    Equipment Recommendations  None recommended by PT    Recommendations for Other Services       Precautions / Restrictions Precautions Precautions: Fall Restrictions Weight Bearing Restrictions: No      Mobility  Bed Mobility Overal bed mobility: Modified Independent                Transfers Overall transfer level: Needs assistance Equipment used: None Transfers: Sit to/from Stand Sit to Stand: Supervision         General transfer comment: Supervision for safety.  Ambulation/Gait Ambulation/Gait assistance: Supervision Ambulation Distance (Feet): 300 Feet Assistive device: None Gait Pattern/deviations: Step-through pattern;Decreased stride length;Drifts right/left   Gait velocity  interpretation: Below normal speed for age/gender General Gait Details: Pt with mildly unsteady gait initially. Tolerated higher level balance challenges with mild deviations of gait/balance deficits. MIld dyspnea present. Vitals stable.  Stairs Stairs: Yes Stairs assistance: Supervision Stair Management: No rails;Alternating pattern Number of Stairs: 3 (+2 steps x2) General stair comments: Supervision for safety.   Wheelchair Mobility    Modified Rankin (Stroke Patients Only)       Balance Overall balance assessment: Needs assistance Sitting-balance support: Feet supported;No upper extremity supported Sitting balance-Leahy Scale: Good     Standing balance support: During functional activity Standing balance-Leahy Scale: Fair               High level balance activites: Direction changes;Turns;Sudden stops;Head turns High Level Balance Comments: MIld balance deficits noted with changes in direction and head turns but able to regain balance without assist.             Pertinent Vitals/Pain Pain Assessment: No/denies pain    Home Living Family/patient expects to be discharged to:: Private residence Living Arrangements: Alone   Type of Home: House Home Access: Stairs to enter Entrance Stairs-Rails: None Entrance Stairs-Number of Steps: 1 Home Layout: One level Home Equipment: None      Prior Function Level of Independence: Independent         Comments: Very active PTA. Drives, does cooking/cleaning.      Hand Dominance   Dominant Hand: Left    Extremity/Trunk Assessment   Upper Extremity Assessment: LUE deficits/detail;Overall [facility] for tasks assessed           Lower Extremity Assessment: Overall WFL for tasks assessed  Communication   Communication: No difficulties  Cognition Arousal/Alertness: Awake/alert Behavior During Therapy: WFL for tasks assessed/performed Overall Cognitive Status: Within Functional Limits for tasks  assessed                      General Comments      Exercises        Assessment/Plan    PT Assessment Patient needs continued PT services  PT Diagnosis Difficulty walking   PT Problem List Decreased balance;Impaired sensation;Cardiopulmonary status limiting activity  PT Treatment Interventions Balance training;Gait training;Stair training;Functional mobility training;Therapeutic activities;Therapeutic exercise;Patient/family education   PT Goals (Current goals can be found in the Care Plan section) Acute Rehab PT Goals Patient Stated Goal: to get better so I can go home PT Goal Formulation: With patient Time For Goal Achievement: 05/03/14 Potential to Achieve Goals: Good    Frequency Min 3X/week   Barriers to discharge Decreased caregiver support      Co-evaluation               End of Session Equipment Utilized During Treatment: Gait belt Activity Tolerance: Patient tolerated treatment well Patient left: in chair;with call bell/phone within reach Nurse Communication: Mobility status         Time: 1735-6701 PT Time Calculation (min) (ACUTE ONLY): 21 min   Charges:   PT Evaluation $Initial PT Evaluation Tier I: 1 Procedure PT Treatments $Gait Training: 8-22 mins   PT G CodesCandy Sledge A 04/19/2014, 10:10 AM Candy Sledge, PT, DPT [phone]

## 2014-04-19 NOTE — Op Note (Signed)
Indication:LLL mass with endobronchial component & brain lesions in the 73 -year-old heavy ex smoker  Written informed consent was obtained from the patient prior to the procedure. The risks of the procedure including coughing, bleeding and a small chance of lung cancer requiring a chest tube were discussed with the patient in great detail and evidenced understanding.  3 mg of Versed and  134mcg of fentanyl were used in divided doses during the procedure. Bronchoscope was inserted from the right Nare. The upper airway appeared normal. Vocal cord showed normal appearance in motion. The trachea bronchial tree was then examined to the subsegmental level. Minimal  secretions were noted. No endobronchial lesions were noted.  Attention was then turned to the left lower lobe. Bronchoalveolar lavage was obtained from the left  lower lobe with good return. Transbronchial biopsies x3 were obtained from the subsegment of the left lower lobe. The patient tolerated procedure well with minimal bleeding.  A portable chest XR. will be performed to rule out presence of pneumothorax. She was awake and alert in the end of the procedure.  If this biopsy is non diagnostic, then other biopsy options would include EBUS under general anesthesia or liver biopsy under US guidance.  Rigoberto Noel MD

## 2014-04-19 NOTE — Consult Note (Signed)
Radiation Oncology         [phone] ________________________________  Initial inpatient Consultation  Name: Michelle Cobb MRN: [id number]  Date: 04/18/2014  DOB: 15-Jul-1940  ZY:SAYTKZ,SWFU Mallie Mussel, MD  No ref. provider found   REFERRING PHYSICIAN: No ref. 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  HISTORY OF PRESENT ILLNESS::Michelle Cobb is a 73 y.o. female who is presenting with complaint of numbness involving left side of her face and left upper extremity for 4-6 weeks and recurrent episodes of involuntary twitching of her left upper extremity. She said no visual changes and no focal weakness. No mental status changes and been noted. She's also had no change in speech nor change in gait. CT scan of the head showed what appeared to be a mass lesion with surrounding vasogenic edema involving the left parietal temporal region, as well as a second area of probable vasogenic edema involving the right corona radiata.   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 She was also started on Decadron   Chest x-ray and subsequent CT showed left lower lobe lung mass measuring almost 6 cm suggestive of primary lung cancer:       PREVIOUS RADIATION THERAPY: No  PAST MEDICAL HISTORY:  has a past medical history of Hypertension and Hyperlipidemia.    PAST SURGICAL HISTORY:History reviewed. No pertinent past surgical history.  FAMILY HISTORY: family history is not on file.  SOCIAL HISTORY:  reports that she has quit smoking. She uses smokeless tobacco. She reports that she drinks alcohol. She reports that she does not use illicit drugs.  ALLERGIES: Review of patient's allergies indicates no known allergies.  MEDICATIONS:  Current Facility-Administered Medications  Medication Dose Route Frequency Provider Last Rate Last Dose  . [START ON 04/20/2014]  atorvastatin (LIPITOR) tablet 10 mg  10 mg Oral q1800 Nita Sells, MD      . cholecalciferol (VITAMIN D) tablet 1,000 Units  1,000 Units Oral Daily Merton Border, MD   1,000 Units at 04/19/14 [phone]  . dexamethasone (DECADRON) injection 10 mg  10 mg Intravenous 4 times per day Nita Sells, MD   10 mg at 04/19/14 1314  . hydrochlorothiazide (HYDRODIURIL) tablet 25 mg  25 mg Oral Daily Merton Border, MD   25 mg at 04/19/14 0943  . iohexol (OMNIPAQUE) 300 MG/ML solution 50 mL  50 mL Oral Once PRN Medication Radiologist, MD      . levETIRAcetam (KEPPRA) tablet 1,000 mg  1,000 mg Oral BID Merton Border, MD   1,000 mg at 04/19/14 0943  . [START ON 04/20/2014] pneumococcal 23 valent vaccine (PNU-IMMUNE) injection 0.5 mL  0.5 mL Intramuscular Tomorrow-1000 Merton Border, MD      . senna-docusate (Senokot-S) tablet 1 tablet  1 tablet Oral QHS PRN Merton Border, MD        REVIEW OF SYSTEMS:  A 15 point review of systems is documented in the electronic medical record. This was obtained by the nursing staff. However, I reviewed this with the patient to discuss relevant findings and make appropriate changes.  Pertinent items are noted in HPI.   PHYSICAL EXAM:  oral temperature is 98.3 F (36.8 C). Her blood pressure is 101/56 and her pulse is 87. Her respiration is 18 and oxygen saturation is 96%.   Per internal medicine: General a very pleasant well developed well nourished female in no acute distress Normal affect and  insight, Not Suicidal or Homicidal, Awake Alert, Oriented X 3. No F.N deficits, ALL C.Nerves Intact, Strength 5/5 all 4 extremities, numbness still present in the left shoulder and arm Ears and Eyes appear Normal, Conjunctivae clear, PERRLA. Moist Oral Mucosa. Supple Neck, No JVD, No cervical lymphadenopathy appriciated, No Carotid Bruits. Symmetrical Chest wall movement, Good air movement bilaterally, CTAB. RRR, No Gallops, Rubs or Murmurs, No Parasternal Heave. Positive Bowel Sounds,  Abdomen Soft, Non tender, No organomegaly appriciated,No rebound -guarding or rigidity. No Cyanosis, Normal Skin Turgor, No Skin Rash or Bruise. Good muscle tone, joints appear normal , no effusions, Normal ROM. No Palpable Lymph Nodes in Neck or Axillae Per neurology:  Mental Status: Alert, oriented, thought content appropriate. Speech fluent without evidence of aphasia. Able to follow commands without difficulty. Cranial Nerves: II-Visual fields were normal. III/IV/VI-Pupils were equal and reacted. Extraocular movements were full and conjugate.   V/VII-no facial numbness and no facial weakness. VIII-normal. X-normal speech and symmetrical palatal movement. Motor: 5/5 bilaterally with normal tone and bulk Sensory: Normal throughout. Deep Tendon Reflexes: 1+ and symmetric. Plantars: Mute bilaterally Cerebellar: Normal finger-to-nose testing.  KPS = 70  100 - Normal; no complaints; no evidence of disease. 90   - Able to carry on normal activity; minor signs or symptoms of disease. 80   - Normal activity with effort; some signs or symptoms of disease. 43   - Cares for self; unable to carry on normal activity or to do active work. 60   - Requires occasional assistance, but is able to care for most of his personal needs. 50   - Requires considerable assistance and frequent medical care. 72   - Disabled; requires special care and assistance. 76   - Severely disabled; hospital admission is indicated although death not imminent. 54   - Very sick; hospital admission necessary; active supportive treatment necessary. 10   - Moribund; fatal processes progressing rapidly. 0     - Dead  Karnofsky DA, Abelmann [place], Craver LS and [place] J[facility] [phone]) The use of the nitrogen mustards in the palliative treatment of carcinoma: with particular reference to bronchogenic carcinoma Cancer 1 [id number]  LABORATORY DATA:  Lab Results  Component Value Date   WBC 9.3 04/18/2014   HGB 12.5 04/18/2014    HCT 36.1 04/18/2014   MCV 73.4* 04/18/2014   PLT 281 04/18/2014   Lab Results  Component Value Date   NA 140 04/18/2014   K 3.2* 04/18/2014   CL 100 04/18/2014   CO2 27 04/18/2014   Lab Results  Component Value Date   ALT 11 04/18/2014   AST 14 04/18/2014   ALKPHOS 73 04/18/2014   BILITOT 0.3 04/18/2014     RADIOGRAPHY: Dg Chest 2 View  04/18/2014   CLINICAL DATA:  Difficulty breathing  EXAM: CHEST  2 VIEW  COMPARISON:  April 10, 2014  FINDINGS: There is persistent left lower lobe consolidation as well as left base atelectasis. Right lung is clear. The heart size is normal. There is fullness in the hilar regions bilaterally, somewhat more on the left than on the right. There is atherosclerotic change in aorta. No bone lesions.  IMPRESSION: Persistent left lower lobe consolidation. Findings concerning for potential adenopathy. Given the persistence of the opacity in the left lower lobe and concern for potential adenopathy, contrast enhanced chest CT advised to assess for adenopathy as well as a possible endobronchial lesion on the left. Overall, there is no new opacity compared to prior study.  Electronically Signed   By: Lowella Grip M.D.   On: 04/18/2014 21:22   Ct Head Wo Contrast  04/18/2014   CLINICAL DATA:  Left sided tremors for past 5 hr.  EXAM: CT HEAD WITHOUT CONTRAST  TECHNIQUE: Contiguous axial images were obtained from the base of the skull through the vertex without intravenous contrast.  COMPARISON:  None.  FINDINGS: The ventricles are normal in size and configuration. 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 No other hemorrhage is seen. There is no mass, extra-axial fluid, or midline shift. There is a questionable second recent and possibly acute infarct in the posterior right Crohn radiata. There is small vessel disease in the periventricular white matter more inferiorly,  particularly involving both internal capsules in the anterior limb of the right external capsule. Bony calvarium appears intact. The mastoid air cells are clear.  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 Question second acute infarct in the posterior right corona radiata. No hemorrhage is seen in this area. There is patchy small vessel disease in the basal ganglia regions and inferior periventricular white matter bilaterally.  Critical Value/emergent results were called by telephone at the time of interpretation on 04/18/2014 at 9:32 pm to Dr. Merrily Pew , who verbally acknowledged these results.   Electronically Signed   By: Lowella Grip M.D.   On: 04/18/2014 21:33   Ct Chest W Contrast  04/19/2014   CLINICAL DATA:  Patient with new brain lesion, suspected metastatic disease. Evaluate for primary malignancy. Patient initially presented for partial seizures.  EXAM: CT CHEST, ABDOMEN, AND PELVIS WITH CONTRAST  TECHNIQUE: Multidetector CT imaging of the chest, abdomen and pelvis was performed following the standard protocol during bolus administration of intravenous contrast.  CONTRAST:  135mL OMNIPAQUE IOHEXOL 300 MG/ML  SOLN  COMPARISON:  None.  FINDINGS: CT CHEST FINDINGS  Visualized thyroid is unremarkable. No enlarged axillary lymphadenopathy. There is a 1.5 cm AP window lymph node (image 26; series 2). Left hilar lymphadenopathy measuring up to 1.6 cm (image 34; series 2).  There is a 6.0 x 5.3 x 4.7 cm left lower lobe mass which has broad-based abutment to the left aspect of the mediastinum. This mass causes occlusion of the distal aspect of the left lower lobe bronchi. There is patchy ground-glass opacity within the peripheral aspect of the left lower lobe. There is an additional left lower lobe nodule measuring 1.1 cm (image 44; series 2).  Additional scattered bilateral pulmonary  nodules are detailed as follows including in 8 mm right upper lobe nodule (image 27; series 3); a 5 mm right middle lobe nodule (image 49; series 3) ; a 1.1 cm ground-glass nodule within the right lower lobe (image 49; series 3) ; 4 mm left upper lobe pulmonary nodule (image 22; series 3); and a 5 mm right lower lobe nodule (image 41; series 3). Two adjacent subpleural opacities representing either focal nodules or atelectasis within the left lower lobe (image 27; series 2) and (image 32; series 2). Scattered centrilobular emphysematous change. Left lower hemi thorax pleural thickening versus fluid.  CT ABDOMEN AND PELVIS FINDINGS  Hepatobiliary: There is a 1 cm hyperenhancing lesion within the hepatic dome (image 54; series 2 and image 37; series 5). Additionally there is suggestion of a 1.5 cm lesion within the right hepatic lobe (image 70; series 2).  Suggestion of an additional hyperdense lesion within the hepatic dome measuring 1.3 cm (image 60; series 2). Multiple gallstones. No gallbladder wall thickening.  Pancreas: Unremarkable  Spleen: Unremarkable  Adrenals/Urinary Tract: 6 cm cyst off the interpolar region of the left kidney. 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 2 mm nonobstructing stone within the interpolar region of the right kidney (image 48; series 5). No ureterolithiasis. No hydronephrosis.  Stomach/Bowel: No abnormal bowel wall thickening or evidence for bowel obstruction. The appendix is normal. No free fluid or free intraperitoneal air.  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 Scattered calcified atherosclerotic plaque involving the abdominal aorta. Focal dilatation of the right common iliac artery measuring 2 cm.  Reproductive: Status post hysterectomy.  Other: There is a 2 cm subcutaneous mass within the soft tissues overlying the inferior aspect of the sternum (image 36;  series 2). Fat containing periumbilical hernia.  Musculoskeletal: Small patchy lucencies within the left and right ilium measuring up to 1.1 cm within the left ilium (image 95; series 2). Additionally there is a 10 mm patchy lucent lesion within T2 vertebral body (image 9; series 2).  IMPRESSION: 1. Left lower lobe mass concerning for primary lung malignancy. There is an additional adjacent satellite nodule within the left lower lobe as well as peripheral ground-glass and consolidative opacities. The opacities may represent postobstructive pneumonitis or additional sites of disease. Small left-sided pleural effusion, malignant effusion is not excluded. 2. Left hilar and AP window adenopathy concerning for metastatic disease. 3. Multiple additional bilateral pulmonary nodules as detailed above. In the setting of primary lung malignancy, metastatic disease is of concern. 4. Multiple hyperdense lesions within the liver as detailed above which may potentially represent hemangiomas or perfusional anomalies however given primary pulmonary malignancy, metastatic disease is not excluded. Attention on followup or dedicated evaluation with MRI in the nonacute setting is recommended. 5. Lucencies within the right and left ilium as well as the T2 vertebral body, nonspecific, however raising the possibility of osseous metastatic disease. 6. Cholelithiasis 7. Nonobstructing stone interpolar region right kidney. 8. Infrarenal abdominal aortic ectasia and focal dilatation of the right common iliac artery. 9. There is a 2 cm subcutaneous mass within the soft tissues overlying the inferior aspect of the sternum (image 36; series 2). This is favored to represent a sebaceous cyst however clinical correlation is recommended. These results will be called to the ordering clinician or representative by the Radiologist Assistant, and communication documented in the PACS or zVision Dashboard.   Electronically Signed   By: Lovey Newcomer M.D.    On: 04/19/2014 09:13   Mr Jeri Cos KV Contrast  04/19/2014   CLINICAL DATA:  Numbness left-sided face and left upper extremity. Involuntary movements in the left upper extremity. Abnormal CT scan.  EXAM: MRI HEAD WITHOUT AND WITH CONTRAST  TECHNIQUE: Multiplanar, multiecho pulse sequences of the brain and surrounding structures were obtained without and with intravenous contrast.  CONTRAST:  47mL MULTIHANCE GADOBENATE DIMEGLUMINE 529 MG/ML IV SOLN  COMPARISON:  CT head without contrast 04/18/2014.  FINDINGS: Enhancing mass lesion is present within the high right parietal lobe, likely involving the cortex. The lesion measures 19 x 14 x 17 mm. There is significant surrounding vasogenic edema. There is some hemorrhage associated with this lesion is well. Diffusion signal is likely related to susceptibility in the hemorrhage.  A lesion in the posterior left frontal lobe white matter measures 15 x  16 x 13 mm with significant surrounding vasogenic edema. There is no hemorrhage associated with this lesion.  Moderate generalized atrophy and periventricular white matter changes are evident bilaterally. No significant midline shift is present. The ventricles are of normal size. No significant extraaxial fluid collection is present.  Flow is present in the major intracranial arteries. The globes and orbits are intact. The paranasal sinuses and mastoid air cells are clear.  IMPRESSION: 1. Hemorrhagic mass lesion in the high right parietal lobe measures 19 x 14 x 17 mm. 2. Enhancing mass lesion in the posterior left frontal lobe white matter measures 15 x 16 x 13 mm. 3. There is significant surrounding vasogenic edema with both lesions. These are most compatible with metastases. 4. Moderate generalized atrophy and diffuse white matter disease. This likely reflects the sequelae of chronic microvascular ischemia.   Electronically Signed   By: Lawrence Santiago M.D.   On: 04/19/2014 10:01   Ct Abdomen Pelvis W  Contrast  04/19/2014   CLINICAL DATA:  Patient with new brain lesion, suspected metastatic disease. Evaluate for primary malignancy. Patient initially presented for partial seizures.  EXAM: CT CHEST, ABDOMEN, AND PELVIS WITH CONTRAST  TECHNIQUE: Multidetector CT imaging of the chest, abdomen and pelvis was performed following the standard protocol during bolus administration of intravenous contrast.  CONTRAST:  18mL OMNIPAQUE IOHEXOL 300 MG/ML  SOLN  COMPARISON:  None.  FINDINGS: CT CHEST FINDINGS  Visualized thyroid is unremarkable. No enlarged axillary lymphadenopathy. There is a 1.5 cm AP window lymph node (image 26; series 2). Left hilar lymphadenopathy measuring up to 1.6 cm (image 34; series 2).  There is a 6.0 x 5.3 x 4.7 cm left lower lobe mass which has broad-based abutment to the left aspect of the mediastinum. This mass causes occlusion of the distal aspect of the left lower lobe bronchi. There is patchy ground-glass opacity within the peripheral aspect of the left lower lobe. There is an additional left lower lobe nodule measuring 1.1 cm (image 44; series 2).  Additional scattered bilateral pulmonary nodules are detailed as follows including in 8 mm right upper lobe nodule (image 27; series 3); a 5 mm right middle lobe nodule (image 49; series 3) ; a 1.1 cm ground-glass nodule within the right lower lobe (image 49; series 3) ; 4 mm left upper lobe pulmonary nodule (image 22; series 3); and a 5 mm right lower lobe nodule (image 41; series 3). Two adjacent subpleural opacities representing either focal nodules or atelectasis within the left lower lobe (image 27; series 2) and (image 32; series 2). Scattered centrilobular emphysematous change. Left lower hemi thorax pleural thickening versus fluid.  CT ABDOMEN AND PELVIS FINDINGS  Hepatobiliary: There is a 1 cm hyperenhancing lesion within the hepatic dome (image 54; series 2 and image 37; series 5). Additionally there is suggestion of a 1.5 cm lesion  within the right hepatic lobe (image 70; series 2). Suggestion of an additional hyperdense lesion within the hepatic dome measuring 1.3 cm (image 60; series 2). Multiple gallstones. No gallbladder wall thickening.  Pancreas: Unremarkable  Spleen: Unremarkable  Adrenals/Urinary Tract: 6 cm cyst off the interpolar region of the left kidney. 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 2 mm nonobstructing stone within the interpolar region of the right kidney (image 48; series 5). No ureterolithiasis. No hydronephrosis.  Stomach/Bowel: No abnormal bowel wall thickening or evidence for bowel obstruction. The appendix is normal. No free  fluid or free intraperitoneal air.  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 Scattered calcified atherosclerotic plaque involving the abdominal aorta. Focal dilatation of the right common iliac artery measuring 2 cm.  Reproductive: Status post hysterectomy.  Other: There is a 2 cm subcutaneous mass within the soft tissues overlying the inferior aspect of the sternum (image 36; series 2). Fat containing periumbilical hernia.  Musculoskeletal: Small patchy lucencies within the left and right ilium measuring up to 1.1 cm within the left ilium (image 95; series 2). Additionally there is a 10 mm patchy lucent lesion within T2 vertebral body (image 9; series 2).  IMPRESSION: 1. Left lower lobe mass concerning for primary lung malignancy. There is an additional adjacent satellite nodule within the left lower lobe as well as peripheral ground-glass and consolidative opacities. The opacities may represent postobstructive pneumonitis or additional sites of disease. Small left-sided pleural effusion, malignant effusion is not excluded. 2. Left hilar and AP window adenopathy concerning for metastatic disease. 3. Multiple additional bilateral pulmonary nodules as detailed above. In the  setting of primary lung malignancy, metastatic disease is of concern. 4. Multiple hyperdense lesions within the liver as detailed above which may potentially represent hemangiomas or perfusional anomalies however given primary pulmonary malignancy, metastatic disease is not excluded. Attention on followup or dedicated evaluation with MRI in the nonacute setting is recommended. 5. Lucencies within the right and left ilium as well as the T2 vertebral body, nonspecific, however raising the possibility of osseous metastatic disease. 6. Cholelithiasis 7. Nonobstructing stone interpolar region right kidney. 8. Infrarenal abdominal aortic ectasia and focal dilatation of the right common iliac artery. 9. There is a 2 cm subcutaneous mass within the soft tissues overlying the inferior aspect of the sternum (image 36; series 2). This is favored to represent a sebaceous cyst however clinical correlation is recommended. These results will be called to the ordering clinician or representative by the Radiologist Assistant, and communication documented in the PACS or zVision Dashboard.   Electronically Signed   By: Lovey Newcomer M.D.   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  PLAN:Agree with planned bronch.  Agree continue dexamethasone.    Today, I talked to the patient and family about the findings and work-up thus far.  We discussed the natural history of brain mets from lung cancer and general treatment, highlighting the role of radiotherapy in the management.  We discussed the available radiation techniques, and focused on the details of logistics and delivery.  We reviewed the anticipated acute and late sequelae associated with radiation in this setting.   Will await bronch/biopsy result.  After bronch, patient could be discharged to home if stable and follow-up as  outpatient.   I spent 30 minutes minutes face to face with the patient and more than 50% of that time was spent in counseling and/or coordination of care.   ------------------------------------------------  [name]. [name], M.D.

## 2014-04-20 DIAGNOSIS — C7931 Secondary malignant neoplasm of brain: Secondary | ICD-10-CM | POA: Diagnosis not present

## 2014-04-20 DIAGNOSIS — I150 Renovascular hypertension: Secondary | ICD-10-CM

## 2014-04-20 MED ORDER — DEXAMETHASONE 4 MG PO TABS: 4.0000 mg | ORAL_TABLET | Freq: Three times a day (TID) | ORAL | Status: DC

## 2014-04-20 MED ORDER — DEXAMETHASONE 4 MG PO TABS: 10.0000 mg | ORAL_TABLET | Freq: Three times a day (TID) | ORAL | Status: DC

## 2014-04-20 MED ORDER — LEVETIRACETAM 1000 MG PO TABS
1000.0000 mg | ORAL_TABLET | Freq: Two times a day (BID) | ORAL | Status: DC
Start: 2014-04-20 — End: 2014-07-23

## 2014-04-20 MED ORDER — DEXAMETHASONE 4 MG PO TABS
4.0000 mg | ORAL_TABLET | Freq: Four times a day (QID) | ORAL | Status: DC
Start: 2014-04-20 — End: 2014-04-30

## 2014-04-20 NOTE — Discharge Summary (Addendum)
Physician Discharge Summary  Michelle Cobb ZOX:[id number] DOB: 06-Jul-1940 DOA: 04/18/2014  PCP: Woody Seller, MD  Admit date: 04/18/2014 Discharge date: 04/20/2014  Time spent: 35 minutes  Recommendations for Outpatient Follow-up:  1. Needs multidisciplinary follow-up for lung cancer with metastases- 2. Please follow-up bronchoscopy biopsy results 3. Continue Decadron 4 mg qid until seen by radiation oncology or oncology  4. continue Keppra  5. No home health needs identified 6. 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 CT head was suspicious for mass and chest x-ray was abnormal Neurology saw the patient and patient was recommended further workup including CT chest abdomen pelvis as well as MRI brain. CT of the chest confirmed a 6 cm mass in left lower lobe of the lung MRI of the brain confirmed lesions to the right parietal left frontal lobe with significant surrounding edema and question of hemorrhage however patient had no further seizures or other issues Patient was seen by radiation oncology as well at the request of pulmonology and it was recommended in radiation therapy first and patient was continued on dexamethasone 4 mg qid and Keppra. Further follow-up will be performed as an outpatient once patient's pathology returns.   patient has understood the plan of care and now is to follow-up with oncology and has numbers available and I have copied them on discharge. I will copy forwarded to Dr. Burr Medico to coordinate down  titration of dexamethasone and further follow-up  Discharge Exam: Filed Vitals:   04/20/14 0618  BP: 112/61  Pulse: 78  Temp: 98.4 F (36.9 C)  Resp: 18    General: Alert pleasant oriented no apparent distress Cardiovascular: S1-S2 no murmur rub or gallop Respiratory: Clinically clear Power 5/5, reflexes 2/3, no focal neurological abnormality Numbness improved in left upper extremity  Discharge Instructions You were cared for by a hospitalist during your hospital stay. If you have any questions about your discharge medications or the care you received while you were in the hospital after you are discharged, you can call the unit and asked to speak with the hospitalist on call if the hospitalist that took care of you is not available. Once you are discharged, your primary care physician will handle any further medical issues. Please note that NO REFILLS for any discharge medications will be authorized once you are discharged, as it is imperative that you return to your primary care physician (or establish a relationship with a primary care physician if you do not have one) for your aftercare needs so that they can reassess your need for medications and monitor your lab values.  Discharge Instructions    Diet - low sodium heart healthy    Complete by:  As directed      Discharge instructions    Complete by:  As directed   Continue Decadron until you are seen by Oncology or Radiation oncology-you should be on this current dose until they decide to taper the dose off.  Your sugar might go up because of this as well. Please follow-up with Dr. Burr Medico Please continue the  Keppra as prescribed If you do not hear from the oncology dept, please call them by Tuesday as yuor results should be available by then If you have seizures of one sided weakness seek immediate medical attention     Increase activity slowly    Complete by:  As directed           Current Discharge Medication List    START  taking these medications   Details  dexamethasone (DECADRON) 4 MG tablet Take 1 tablet (4 mg total) by mouth 4 (four) times daily. Qty: 45 tablet, Refills: 0    levETIRAcetam (KEPPRA) 1000 MG tablet Take 1 tablet (1,000 mg total) by mouth 2 (two) times daily. Qty: 60 tablet, Refills: 0      CONTINUE these medications which have NOT CHANGED   Details  aspirin 81 MG tablet Take 81 mg by mouth daily.    atorvastatin (LIPITOR) 10 MG tablet Take 10 mg by mouth daily. Refills: 0    cholecalciferol (VITAMIN D) 1000 UNITS tablet Take 1,000 Units by mouth daily.    hydrochlorothiazide (HYDRODIURIL) 25 MG tablet Take 25 mg by mouth daily.       No Known Allergies Follow-up Information    Follow up with Woody Seller, MD.   Specialty:  Family Medicine   Contact information:   [address] [place] [address] [phone]        The results of significant diagnostics from this hospitalization (including imaging, microbiology, ancillary and laboratory) are listed below for reference.    Significant Diagnostic Studies: Dg Chest 2 View  04/18/2014   CLINICAL DATA:  Difficulty breathing  EXAM: CHEST  2 VIEW  COMPARISON:  April 10, 2014  FINDINGS: There is persistent left lower lobe consolidation as well as left base atelectasis. Right lung is clear. The heart size is normal. There is fullness in the hilar regions bilaterally, somewhat more on the left than on the right. There is atherosclerotic change in aorta. No bone lesions.  IMPRESSION: Persistent left lower lobe consolidation. Findings concerning for potential adenopathy. Given the persistence of the opacity in the left lower lobe and concern for potential adenopathy, contrast enhanced chest CT advised to assess for adenopathy as well as a possible endobronchial lesion on the left. Overall, there is no new opacity compared to prior study.   Electronically Signed   By: Lowella Grip M.D.   On: 04/18/2014 21:22   Ct Head  Wo Contrast  04/18/2014   CLINICAL DATA:  Left sided tremors for past 5 hr.  EXAM: CT HEAD WITHOUT CONTRAST  TECHNIQUE: Contiguous axial images were obtained from the base of the skull through the vertex without intravenous contrast.  COMPARISON:  None.  FINDINGS: The ventricles are normal in size and configuration. 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 No other hemorrhage is seen. There is no mass, extra-axial fluid, or midline shift. There is a questionable second recent and possibly acute infarct in the posterior right Crohn radiata. There is small vessel disease in the periventricular white matter more inferiorly, particularly involving both internal capsules in the anterior limb of the right external capsule. Bony calvarium appears intact. The mastoid air cells are clear.  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 Question second acute infarct in the posterior right corona radiata. No hemorrhage is seen  in this area. There is patchy small vessel disease in the basal ganglia regions and inferior periventricular white matter bilaterally.  Critical Value/emergent results were called by telephone at the time of interpretation on 04/18/2014 at 9:32 pm to Dr. Merrily Pew , who verbally acknowledged these results.   Electronically Signed   By: Lowella Grip M.D.   On: 04/18/2014 21:33   Ct Chest W Contrast  04/19/2014   CLINICAL DATA:  Patient with new brain lesion, suspected metastatic disease. Evaluate for primary malignancy. Patient initially presented for partial seizures.  EXAM: CT CHEST, ABDOMEN, AND PELVIS WITH CONTRAST  TECHNIQUE: Multidetector CT imaging of the chest, abdomen and pelvis was performed following the standard protocol during bolus administration of intravenous contrast.   CONTRAST:  135mL OMNIPAQUE IOHEXOL 300 MG/ML  SOLN  COMPARISON:  None.  FINDINGS: CT CHEST FINDINGS  Visualized thyroid is unremarkable. No enlarged axillary lymphadenopathy. There is a 1.5 cm AP window lymph node (image 26; series 2). Left hilar lymphadenopathy measuring up to 1.6 cm (image 34; series 2).  There is a 6.0 x 5.3 x 4.7 cm left lower lobe mass which has broad-based abutment to the left aspect of the mediastinum. This mass causes occlusion of the distal aspect of the left lower lobe bronchi. There is patchy ground-glass opacity within the peripheral aspect of the left lower lobe. There is an additional left lower lobe nodule measuring 1.1 cm (image 44; series 2).  Additional scattered bilateral pulmonary nodules are detailed as follows including in 8 mm right upper lobe nodule (image 27; series 3); a 5 mm right middle lobe nodule (image 49; series 3) ; a 1.1 cm ground-glass nodule within the right lower lobe (image 49; series 3) ; 4 mm left upper lobe pulmonary nodule (image 22; series 3); and a 5 mm right lower lobe nodule (image 41; series 3). Two adjacent subpleural opacities representing either focal nodules or atelectasis within the left lower lobe (image 27; series 2) and (image 32; series 2). Scattered centrilobular emphysematous change. Left lower hemi thorax pleural thickening versus fluid.  CT ABDOMEN AND PELVIS FINDINGS  Hepatobiliary: There is a 1 cm hyperenhancing lesion within the hepatic dome (image 54; series 2 and image 37; series 5). Additionally there is suggestion of a 1.5 cm lesion within the right hepatic lobe (image 70; series 2). Suggestion of an additional hyperdense lesion within the hepatic dome measuring 1.3 cm (image 60; series 2). Multiple gallstones. No gallbladder wall thickening.  Pancreas: Unremarkable  Spleen: Unremarkable  Adrenals/Urinary Tract: 6 cm cyst off the interpolar region of the left kidney. Focal parenchymal atrophy versus small subcapsular angiomyolipoma  measuring 10 mm in the interpolar region of the right kidney. 2 mm nonobstructing stone within the interpolar region of the right kidney (image 48; series 5). No ureterolithiasis. No hydronephrosis.  Stomach/Bowel: No abnormal bowel wall thickening or evidence for bowel obstruction. The appendix is normal. No free fluid or free intraperitoneal air.  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 Scattered calcified atherosclerotic plaque involving the abdominal aorta. Focal dilatation of the right common iliac artery measuring 2 cm.  Reproductive: Status post hysterectomy.  Other: There is a 2 cm subcutaneous mass within the soft tissues overlying the inferior aspect of the sternum (image 36; series 2). Fat containing periumbilical hernia.  Musculoskeletal: Small patchy lucencies within the left and right ilium measuring up to 1.1 cm within the left ilium (image 95; series 2). Additionally there is a 10 mm patchy  lucent lesion within T2 vertebral body (image 9; series 2).  IMPRESSION: 1. Left lower lobe mass concerning for primary lung malignancy. There is an additional adjacent satellite nodule within the left lower lobe as well as peripheral ground-glass and consolidative opacities. The opacities may represent postobstructive pneumonitis or additional sites of disease. Small left-sided pleural effusion, malignant effusion is not excluded. 2. Left hilar and AP window adenopathy concerning for metastatic disease. 3. Multiple additional bilateral pulmonary nodules as detailed above. In the setting of primary lung malignancy, metastatic disease is of concern. 4. Multiple hyperdense lesions within the liver as detailed above which may potentially represent hemangiomas or perfusional anomalies however given primary pulmonary malignancy, metastatic disease is not excluded. Attention on followup or dedicated evaluation with MRI in the nonacute setting is recommended. 5. Lucencies within the  right and left ilium as well as the T2 vertebral body, nonspecific, however raising the possibility of osseous metastatic disease. 6. Cholelithiasis 7. Nonobstructing stone interpolar region right kidney. 8. Infrarenal abdominal aortic ectasia and focal dilatation of the right common iliac artery. 9. There is a 2 cm subcutaneous mass within the soft tissues overlying the inferior aspect of the sternum (image 36; series 2). This is favored to represent a sebaceous cyst however clinical correlation is recommended. These results will be called to the ordering clinician or representative by the Radiologist Assistant, and communication documented in the PACS or zVision Dashboard.   Electronically Signed   By: Lovey Newcomer M.D.   On: 04/19/2014 09:13   Mr Jeri Cos AC Contrast  04/19/2014   CLINICAL DATA:  Numbness left-sided face and left upper extremity. Involuntary movements in the left upper extremity. Abnormal CT scan.  EXAM: MRI HEAD WITHOUT AND WITH CONTRAST  TECHNIQUE: Multiplanar, multiecho pulse sequences of the brain and surrounding structures were obtained without and with intravenous contrast.  CONTRAST:  25mL MULTIHANCE GADOBENATE DIMEGLUMINE 529 MG/ML IV SOLN  COMPARISON:  CT head without contrast 04/18/2014.  FINDINGS: Enhancing mass lesion is present within the high right parietal lobe, likely involving the cortex. The lesion measures 19 x 14 x 17 mm. There is significant surrounding vasogenic edema. There is some hemorrhage associated with this lesion is well. Diffusion signal is likely related to susceptibility in the hemorrhage.  A lesion in the posterior left frontal lobe white matter measures 15 x 16 x 13 mm with significant surrounding vasogenic edema. There is no hemorrhage associated with this lesion.  Moderate generalized atrophy and periventricular white matter changes are evident bilaterally. No significant midline shift is present. The ventricles are of normal size. No significant extraaxial  fluid collection is present.  Flow is present in the major intracranial arteries. The globes and orbits are intact. The paranasal sinuses and mastoid air cells are clear.  IMPRESSION: 1. Hemorrhagic mass lesion in the high right parietal lobe measures 19 x 14 x 17 mm. 2. Enhancing mass lesion in the posterior left frontal lobe white matter measures 15 x 16 x 13 mm. 3. There is significant surrounding vasogenic edema with both lesions. These are most compatible with metastases. 4. Moderate generalized atrophy and diffuse white matter disease. This likely reflects the sequelae of chronic microvascular ischemia.   Electronically Signed   By: Lawrence Santiago M.D.   On: 04/19/2014 10:01   Ct Abdomen Pelvis W Contrast  04/19/2014   CLINICAL DATA:  Patient with new brain lesion, suspected metastatic disease. Evaluate for primary malignancy. Patient initially presented for partial seizures.  EXAM: CT CHEST,  ABDOMEN, AND PELVIS WITH CONTRAST  TECHNIQUE: Multidetector CT imaging of the chest, abdomen and pelvis was performed following the standard protocol during bolus administration of intravenous contrast.  CONTRAST:  16mL OMNIPAQUE IOHEXOL 300 MG/ML  SOLN  COMPARISON:  None.  FINDINGS: CT CHEST FINDINGS  Visualized thyroid is unremarkable. No enlarged axillary lymphadenopathy. There is a 1.5 cm AP window lymph node (image 26; series 2). Left hilar lymphadenopathy measuring up to 1.6 cm (image 34; series 2).  There is a 6.0 x 5.3 x 4.7 cm left lower lobe mass which has broad-based abutment to the left aspect of the mediastinum. This mass causes occlusion of the distal aspect of the left lower lobe bronchi. There is patchy ground-glass opacity within the peripheral aspect of the left lower lobe. There is an additional left lower lobe nodule measuring 1.1 cm (image 44; series 2).  Additional scattered bilateral pulmonary nodules are detailed as follows including in 8 mm right upper lobe nodule (image 27; series 3); a 5 mm  right middle lobe nodule (image 49; series 3) ; a 1.1 cm ground-glass nodule within the right lower lobe (image 49; series 3) ; 4 mm left upper lobe pulmonary nodule (image 22; series 3); and a 5 mm right lower lobe nodule (image 41; series 3). Two adjacent subpleural opacities representing either focal nodules or atelectasis within the left lower lobe (image 27; series 2) and (image 32; series 2). Scattered centrilobular emphysematous change. Left lower hemi thorax pleural thickening versus fluid.  CT ABDOMEN AND PELVIS FINDINGS  Hepatobiliary: There is a 1 cm hyperenhancing lesion within the hepatic dome (image 54; series 2 and image 37; series 5). Additionally there is suggestion of a 1.5 cm lesion within the right hepatic lobe (image 70; series 2). Suggestion of an additional hyperdense lesion within the hepatic dome measuring 1.3 cm (image 60; series 2). Multiple gallstones. No gallbladder wall thickening.  Pancreas: Unremarkable  Spleen: Unremarkable  Adrenals/Urinary Tract: 6 cm cyst off the interpolar region of the left kidney. 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 2 mm nonobstructing stone within the interpolar region of the right kidney (image 48; series 5). No ureterolithiasis. No hydronephrosis.  Stomach/Bowel: No abnormal bowel wall thickening or evidence for bowel obstruction. The appendix is normal. No free fluid or free intraperitoneal air.  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 Scattered calcified atherosclerotic plaque involving the abdominal aorta. Focal dilatation of the right common iliac artery measuring 2 cm.  Reproductive: Status post hysterectomy.  Other: There is a 2 cm subcutaneous mass within the soft tissues overlying the inferior aspect of the sternum (image 36; series 2). Fat containing periumbilical hernia.  Musculoskeletal: Small patchy lucencies within the  left and right ilium measuring up to 1.1 cm within the left ilium (image 95; series 2). Additionally there is a 10 mm patchy lucent lesion within T2 vertebral body (image 9; series 2).  IMPRESSION: 1. Left lower lobe mass concerning for primary lung malignancy. There is an additional adjacent satellite nodule within the left lower lobe as well as peripheral ground-glass and consolidative opacities. The opacities may represent postobstructive pneumonitis or additional sites of disease. Small left-sided pleural effusion, malignant effusion is not excluded. 2. Left hilar and AP window adenopathy concerning for metastatic disease. 3. Multiple additional bilateral pulmonary nodules as detailed above. In the setting of primary lung malignancy, metastatic disease is of concern. 4. Multiple hyperdense lesions within  the liver as detailed above which may potentially represent hemangiomas or perfusional anomalies however given primary pulmonary malignancy, metastatic disease is not excluded. Attention on followup or dedicated evaluation with MRI in the nonacute setting is recommended. 5. Lucencies within the right and left ilium as well as the T2 vertebral body, nonspecific, however raising the possibility of osseous metastatic disease. 6. Cholelithiasis 7. Nonobstructing stone interpolar region right kidney. 8. Infrarenal abdominal aortic ectasia and focal dilatation of the right common iliac artery. 9. There is a 2 cm subcutaneous mass within the soft tissues overlying the inferior aspect of the sternum (image 36; series 2). This is favored to represent a sebaceous cyst however clinical correlation is recommended. These results will be called to the ordering clinician or representative by the Radiologist Assistant, and communication documented in the PACS or zVision Dashboard.   Electronically Signed   By: Lovey Newcomer M.D.   On: 04/19/2014 09:13   Dg Chest Port 1 View  04/19/2014   CLINICAL DATA:  Post bronchoscopy with  biopsy.  EXAM: PORTABLE CHEST - 1 VIEW  COMPARISON:  04/10/2014 as well as CT 04/19/2014  FINDINGS: Lungs are adequately inflated with mild opacification over the left base likely atelectasis. There is evidence of patient's medial left base/ infrahilar mass in the retrocardiac region. Evidence of known left hilar adenopathy. No evidence of pneumothorax. Cardiomediastinal silhouette and remainder of the exam is unchanged.  IMPRESSION: Known medial left basilar lung mass and left hilar adenopathy. No evidence pneumothorax post bronchoscopy/ biopsy. Linear density left base likely atelectasis.   Electronically Signed   By: Marin Olp M.D.   On: 04/19/2014 16:48    Microbiology: No results found for this or any previous visit (from the past 240 hour(s)).   Labs: Basic Metabolic Panel:  Recent Labs Lab 04/18/14 2023  NA 140  K 3.2*  CL 100  CO2 27  GLUCOSE 110*  BUN 10  CREATININE 0.77  CALCIUM 8.5   Liver Function Tests:  Recent Labs Lab 04/18/14 2023  AST 14  ALT 11  ALKPHOS 73  BILITOT 0.3  PROT 7.1  ALBUMIN 3.1*   No results for input(s): LIPASE, AMYLASE in the last 168 hours. No results for input(s): AMMONIA in the last 168 hours. CBC:  Recent Labs Lab 04/18/14 2023  WBC 9.3  NEUTROABS 6.1  HGB 12.5  HCT 36.1  MCV 73.4*  PLT 281   Cardiac Enzymes:  Recent Labs Lab 04/18/14 2023  TROPONINI <0.30   BNP: BNP (last 3 results) No results for input(s): PROBNP in the last 8760 hours. CBG: No results for input(s): GLUCAP in the last 168 hours.     SignedNita Sells  [facility] 04/20/2014, 8:31 AM

## 2014-04-20 NOTE — Evaluation (Signed)
Occupational Therapy Evaluation Patient Details Name: Michelle Cobb MRN: [id number] DOB: [birth year] Today's Date: 04/20/2014    History of Present Illness Patient is a 73 y/o female with PMH of HTn and HLD presenting with complaints of numbness involving left side of her face and left upper extremity for 4-6 weeks and recurrent episodes of involuntary twitching of her left upper extremity.  CT scan of the head showed what appeared to be a mass lesion with surrounding vasogenic edema involving the left parietal temporal region, as well as a second area of probable vasogenic edema involving the right corona radiata likely metastatic. 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    Clinical Impression   Pt appears at or close to baseline with ADL. Educated on safety with ADL including to have supervision for initial showering, standing in tub rather than getting down in tub, sitting to thread on clothing, and pacing self. She has family that can provide initial supervision intermittently and a neighbor that can check on her. No further acute OT needs.    Follow Up Recommendations  No OT follow up;Supervision - Intermittent    Equipment Recommendations  None recommended by OT    Recommendations for Other Services       Precautions / Restrictions Precautions Precautions: None Restrictions Weight Bearing Restrictions: No      Mobility Bed Mobility                  Transfers Overall transfer level: Modified independent                    Balance                                            ADL Overall ADL's : At baseline                                       General ADL Comments: Pt states she was up to the bathroom room earlier without difficulty and did her own washup. She practiced on and off the toilet without needing UE support to rise and descend. Stepped over tub edge holding to wall and  pt advised to do a stand up shower rather than getting down in tub initially. Advised pt to also hold to wall of shower to step in and out and for safety to sit down and thread clothing over LEs also. Pt moving around well with no LOB. Recommended she have initial superivsion for showering or someone nearby and pt states she can have this supervision. She states she has a neighbor that can help and hoping family can stay in town longer.      Vision                     Perception     Praxis      Pertinent Vitals/Pain Pain Assessment: No/denies pain     Hand Dominance Left   Extremity/Trunk Assessment Upper Extremity Assessment Upper Extremity Assessment: LUE deficits/detail LUE Deficits / Details: strength WNL LUE Sensation: decreased light touch           Communication Communication Communication: No difficulties   Cognition Arousal/Alertness: Awake/alert Behavior During Therapy: WFL for tasks assessed/performed Overall Cognitive Status: Within Functional Limits  for tasks assessed                     General Comments       Exercises       Shoulder Instructions      Home Living Family/patient expects to be discharged to:: Private residence Living Arrangements: Alone   Type of Home: House Home Access: Stairs to enter [employer] of Steps: 1 Entrance Stairs-Rails: None Home Layout: One level     Bathroom Shower/Tub: Teacher, early years/pre: Standard     Home Equipment: None          Prior Functioning/Environment Level of Independence: Independent        Comments: Very active PTA. Drives, does cooking/cleaning.     OT Diagnosis: Generalized weakness   OT Problem List:     OT Treatment/Interventions:      OT Goals(Current goals can be found in the care plan section) Acute Rehab OT Goals Patient Stated Goal: home when ready  OT Frequency:     Barriers to D/C:            Co-evaluation               End of Session    Activity Tolerance: Patient tolerated treatment well Patient left: in chair;with call bell/phone within reach   Time: 0912-0925 OT Time Calculation (min): 13 min Charges:  OT General Charges $OT Visit: 1 Procedure OT Evaluation $Initial OT Evaluation Tier I: 1 Procedure OT Treatments $Self Care/Home Management : 8-22 mins G-Codes:    Jules Schick  [phone] 04/20/2014, 9:33 AM

## 2014-04-20 NOTE — Progress Notes (Signed)
Discharge instructions given to pt. Pt verbally acknowledged understanding. Pt's family at side with pt's belongings.  Pt to be transported per private car by family member to home.  Pt to be transported per wheelchair to lobby by guest services personnel.  Will monitor   Michelle Cobb I 04/20/2014 11:21 AM

## 2014-04-20 NOTE — Progress Notes (Signed)
Pt transported our of unit per wheelchair by guest services personnel. No distress noted.   Angeline Slim I 04/20/2014 11:21 AM

## 2014-04-20 NOTE — Plan of Care (Signed)
Problem: Consults Goal: General Medical Patient Education See Patient Education Module for specific education. Outcome: Progressing  Problem: Phase I Progression Outcomes Goal: Pain controlled with appropriate interventions Outcome: Progressing Goal: OOB as tolerated unless otherwise ordered Outcome: Completed/Met Date Met:  04/20/14 Goal: Initial discharge plan identified Outcome: Progressing Goal: Voiding-avoid urinary catheter unless indicated Outcome: Completed/Met Date Met:  04/20/14 Goal: Hemodynamically stable Outcome: Progressing Goal: Other Phase I Outcomes/Goals Outcome: Not Applicable Date Met:  82/08/13

## 2014-04-20 NOTE — Progress Notes (Signed)
NEURO HOSPITALIST PROGRESS NOTE   SUBJECTIVE:                                                                                                                        Stated that the left side still numb but offers no other neurological complains. Underwent bronchoscopy and left lower lobe lung mass transbronchial biopsy, results pending. MRI brain " hemorrhagic mass lesion in the high right parietal lobe measures 19 x 14 x 17 mm.Enhancing mass lesion in the posterior left frontal lobe white matter measures 15 x 16 x 13 mm.There is significant surrounding vasogenic edema with both lesions. These are most compatible with metastases" She is concern " with that medication that is worsening my blood sugar". On keppra 500 mg BID and decadron 10 mg IV every 6 hours.  OBJECTIVE:                                                                                                                           Vital signs in last 24 hours: Temp:  [97.7 F (36.5 C)-98.7 F (37.1 C)] 98.4 F (36.9 C) (11/28 0618) Pulse Rate:  [78-119] 78 (11/28 0618) Resp:  [12-23] 18 (11/28 0618) BP: (78-206)/(30-179) 112/61 mmHg (11/28 0618) SpO2:  [92 %-100 %] 94 % (11/28 0618) Weight:  [98.941 kg (218 lb 2 oz)] 98.941 kg (218 lb 2 oz) (11/28 2683)  Intake/Output from previous day:   Intake/Output this shift:   Nutritional status: Diet Heart Diet - low sodium heart healthy  Past Medical History  Diagnosis Date  . Hypertension   . Hyperlipidemia    Physical exam: pleasant female in no apparent distress. Head: normocephalic. Neck: supple, no bruits, no JVD. Cardiac: no murmurs. Lungs: clear. Abdomen: soft, no tender, no mass. Extremities: no edema. Neurologic Exam:  General: Mental Status: Alert, oriented, thought content appropriate.  Speech fluent without evidence of aphasia.  Able to follow 3 step commands without difficulty. Cranial Nerves: II: Discs flat  bilaterally; Visual fields grossly normal, pupils equal, round, reactive to light and accommodation III,IV, VI: ptosis not present, extra-ocular motions intact bilaterally V,VII: smile symmetric, facial light touch sensation normal bilaterally VIII: hearing normal bilaterally IX,X: gag reflex present XI: bilateral shoulder shrug XII: midline  tongue extension without atrophy or fasciculations Motor: Right : Upper extremity   5/5    Left:     Upper extremity   5/5  Lower extremity   5/5     Lower extremity   5/5 Tone and bulk:normal tone throughout; no atrophy noted Sensory: Pinprick and light touch intact throughout, bilaterally Deep Tendon Reflexes:  1+ upper ext and patella  Plantars: Right: downgoing   Left: downgoing Cerebellar: normal finger-to-nose,  normal heel-to-shin test Gait:  No ataxia. CV: pulses palpable throughout    Lab Results: Lab Results  Component Value Date/Time   CHOL 134 04/19/2014 04:09 AM   Lipid Panel  Recent Labs  04/19/14 0409  CHOL 134  TRIG 41  HDL 37*  CHOLHDL 3.6  VLDL 8  LDLCALC 89    Studies/Results: Dg Chest 2 View  04/18/2014   CLINICAL DATA:  Difficulty breathing  EXAM: CHEST  2 VIEW  COMPARISON:  April 10, 2014  FINDINGS: There is persistent left lower lobe consolidation as well as left base atelectasis. Right lung is clear. The heart size is normal. There is fullness in the hilar regions bilaterally, somewhat more on the left than on the right. There is atherosclerotic change in aorta. No bone lesions.  IMPRESSION: Persistent left lower lobe consolidation. Findings concerning for potential adenopathy. Given the persistence of the opacity in the left lower lobe and concern for potential adenopathy, contrast enhanced chest CT advised to assess for adenopathy as well as a possible endobronchial lesion on the left. Overall, there is no new opacity compared to prior study.   Electronically Signed   By: Lowella Grip M.D.   On:  04/18/2014 21:22   Ct Head Wo Contrast  04/18/2014   CLINICAL DATA:  Left sided tremors for past 5 hr.  EXAM: CT HEAD WITHOUT CONTRAST  TECHNIQUE: Contiguous axial images were obtained from the base of the skull through the vertex without intravenous contrast.  COMPARISON:  None.  FINDINGS: The ventricles are normal in size and configuration. 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 No other hemorrhage is seen. There is no mass, extra-axial fluid, or midline shift. There is a questionable second recent and possibly acute infarct in the posterior right Crohn radiata. There is small vessel disease in the periventricular white matter more inferiorly, particularly involving both internal capsules in the anterior limb of the right external capsule. Bony calvarium appears intact. The mastoid air cells are clear.  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 Question second acute infarct in the posterior right corona radiata. No hemorrhage is seen in this area. There is patchy small vessel disease in the basal ganglia regions and inferior periventricular white matter bilaterally.  Critical Value/emergent results were called by telephone at the time of interpretation on 04/18/2014 at 9:32 pm to Dr. Merrily Pew , who verbally acknowledged these results.   Electronically Signed   By: Lowella Grip M.D.   On: 04/18/2014 21:33   Ct Chest W Contrast  04/19/2014   CLINICAL DATA:  Patient with new brain lesion, suspected metastatic disease. Evaluate for primary malignancy. Patient initially presented for partial seizures.  EXAM: CT CHEST, ABDOMEN, AND PELVIS WITH CONTRAST  TECHNIQUE: Multidetector CT imaging of the chest, abdomen and pelvis was performed following the standard protocol during bolus administration  of intravenous contrast.  CONTRAST:  17mL OMNIPAQUE IOHEXOL 300 MG/ML  SOLN  COMPARISON:  None.  FINDINGS: CT CHEST FINDINGS  Visualized thyroid is unremarkable. No enlarged axillary lymphadenopathy. There is a 1.5 cm AP window lymph node (image 26; series 2). Left hilar lymphadenopathy measuring up to 1.6 cm (image 34; series 2).  There is a 6.0 x 5.3 x 4.7 cm left lower lobe mass which has broad-based abutment to the left aspect of the mediastinum. This mass causes occlusion of the distal aspect of the left lower lobe bronchi. There is patchy ground-glass opacity within the peripheral aspect of the left lower lobe. There is an additional left lower lobe nodule measuring 1.1 cm (image 44; series 2).  Additional scattered bilateral pulmonary nodules are detailed as follows including in 8 mm right upper lobe nodule (image 27; series 3); a 5 mm right middle lobe nodule (image 49; series 3) ; a 1.1 cm ground-glass nodule within the right lower lobe (image 49; series 3) ; 4 mm left upper lobe pulmonary nodule (image 22; series 3); and a 5 mm right lower lobe nodule (image 41; series 3). Two adjacent subpleural opacities representing either focal nodules or atelectasis within the left lower lobe (image 27; series 2) and (image 32; series 2). Scattered centrilobular emphysematous change. Left lower hemi thorax pleural thickening versus fluid.  CT ABDOMEN AND PELVIS FINDINGS  Hepatobiliary: There is a 1 cm hyperenhancing lesion within the hepatic dome (image 54; series 2 and image 37; series 5). Additionally there is suggestion of a 1.5 cm lesion within the right hepatic lobe (image 70; series 2). Suggestion of an additional hyperdense lesion within the hepatic dome measuring 1.3 cm (image 60; series 2). Multiple gallstones. No gallbladder wall thickening.  Pancreas: Unremarkable  Spleen: Unremarkable  Adrenals/Urinary Tract: 6 cm cyst off the interpolar region of the left kidney. 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 2 mm nonobstructing stone within the interpolar region of the right kidney (image 48; series 5). No ureterolithiasis. No hydronephrosis.  Stomach/Bowel: No abnormal bowel wall thickening or evidence for bowel obstruction. The appendix is normal. No free fluid or free intraperitoneal air.  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 Scattered calcified atherosclerotic plaque involving the abdominal aorta. Focal dilatation of the right common iliac artery measuring 2 cm.  Reproductive: Status post hysterectomy.  Other: There is a 2 cm subcutaneous mass within the soft tissues overlying the inferior aspect of the sternum (image 36; series 2). Fat containing periumbilical hernia.  Musculoskeletal: Small patchy lucencies within the left and right ilium measuring up to 1.1 cm within the left ilium (image 95; series 2). Additionally there is a 10 mm patchy lucent lesion within T2 vertebral body (image 9; series 2).  IMPRESSION: 1. Left lower lobe mass concerning for primary lung malignancy. There is an additional adjacent satellite nodule within the left lower lobe as well as peripheral ground-glass and consolidative opacities. The opacities may represent postobstructive pneumonitis or additional sites of disease. Small left-sided pleural effusion, malignant effusion is not excluded. 2. Left hilar and AP window adenopathy concerning for metastatic disease. 3. Multiple additional bilateral pulmonary nodules as detailed above. In the setting of primary lung malignancy, metastatic disease is of concern. 4. Multiple hyperdense lesions within the liver as detailed above which may potentially represent hemangiomas or perfusional anomalies however given primary pulmonary malignancy, metastatic disease is not excluded. Attention on followup or dedicated evaluation with  MRI in the nonacute setting is recommended.  5. Lucencies within the right and left ilium as well as the T2 vertebral body, nonspecific, however raising the possibility of osseous metastatic disease. 6. Cholelithiasis 7. Nonobstructing stone interpolar region right kidney. 8. Infrarenal abdominal aortic ectasia and focal dilatation of the right common iliac artery. 9. There is a 2 cm subcutaneous mass within the soft tissues overlying the inferior aspect of the sternum (image 36; series 2). This is favored to represent a sebaceous cyst however clinical correlation is recommended. These results will be called to the ordering clinician or representative by the Radiologist Assistant, and communication documented in the PACS or zVision Dashboard.   Electronically Signed   By: Lovey Newcomer M.D.   On: 04/19/2014 09:13   Mr Jeri Cos DG Contrast  04/19/2014   CLINICAL DATA:  Numbness left-sided face and left upper extremity. Involuntary movements in the left upper extremity. Abnormal CT scan.  EXAM: MRI HEAD WITHOUT AND WITH CONTRAST  TECHNIQUE: Multiplanar, multiecho pulse sequences of the brain and surrounding structures were obtained without and with intravenous contrast.  CONTRAST:  25mL MULTIHANCE GADOBENATE DIMEGLUMINE 529 MG/ML IV SOLN  COMPARISON:  CT head without contrast 04/18/2014.  FINDINGS: Enhancing mass lesion is present within the high right parietal lobe, likely involving the cortex. The lesion measures 19 x 14 x 17 mm. There is significant surrounding vasogenic edema. There is some hemorrhage associated with this lesion is well. Diffusion signal is likely related to susceptibility in the hemorrhage.  A lesion in the posterior left frontal lobe white matter measures 15 x 16 x 13 mm with significant surrounding vasogenic edema. There is no hemorrhage associated with this lesion.  Moderate generalized atrophy and periventricular white matter changes are evident bilaterally. No significant midline shift is present. The ventricles are of normal size. No  significant extraaxial fluid collection is present.  Flow is present in the major intracranial arteries. The globes and orbits are intact. The paranasal sinuses and mastoid air cells are clear.  IMPRESSION: 1. Hemorrhagic mass lesion in the high right parietal lobe measures 19 x 14 x 17 mm. 2. Enhancing mass lesion in the posterior left frontal lobe white matter measures 15 x 16 x 13 mm. 3. There is significant surrounding vasogenic edema with both lesions. These are most compatible with metastases. 4. Moderate generalized atrophy and diffuse white matter disease. This likely reflects the sequelae of chronic microvascular ischemia.   Electronically Signed   By: Lawrence Santiago M.D.   On: 04/19/2014 10:01   Ct Abdomen Pelvis W Contrast  04/19/2014   CLINICAL DATA:  Patient with new brain lesion, suspected metastatic disease. Evaluate for primary malignancy. Patient initially presented for partial seizures.  EXAM: CT CHEST, ABDOMEN, AND PELVIS WITH CONTRAST  TECHNIQUE: Multidetector CT imaging of the chest, abdomen and pelvis was performed following the standard protocol during bolus administration of intravenous contrast.  CONTRAST:  175mL OMNIPAQUE IOHEXOL 300 MG/ML  SOLN  COMPARISON:  None.  FINDINGS: CT CHEST FINDINGS  Visualized thyroid is unremarkable. No enlarged axillary lymphadenopathy. There is a 1.5 cm AP window lymph node (image 26; series 2). Left hilar lymphadenopathy measuring up to 1.6 cm (image 34; series 2).  There is a 6.0 x 5.3 x 4.7 cm left lower lobe mass which has broad-based abutment to the left aspect of the mediastinum. This mass causes occlusion of the distal aspect of the left lower lobe bronchi. There is patchy ground-glass opacity within the peripheral aspect  of the left lower lobe. There is an additional left lower lobe nodule measuring 1.1 cm (image 44; series 2).  Additional scattered bilateral pulmonary nodules are detailed as follows including in 8 mm right upper lobe nodule  (image 27; series 3); a 5 mm right middle lobe nodule (image 49; series 3) ; a 1.1 cm ground-glass nodule within the right lower lobe (image 49; series 3) ; 4 mm left upper lobe pulmonary nodule (image 22; series 3); and a 5 mm right lower lobe nodule (image 41; series 3). Two adjacent subpleural opacities representing either focal nodules or atelectasis within the left lower lobe (image 27; series 2) and (image 32; series 2). Scattered centrilobular emphysematous change. Left lower hemi thorax pleural thickening versus fluid.  CT ABDOMEN AND PELVIS FINDINGS  Hepatobiliary: There is a 1 cm hyperenhancing lesion within the hepatic dome (image 54; series 2 and image 37; series 5). Additionally there is suggestion of a 1.5 cm lesion within the right hepatic lobe (image 70; series 2). Suggestion of an additional hyperdense lesion within the hepatic dome measuring 1.3 cm (image 60; series 2). Multiple gallstones. No gallbladder wall thickening.  Pancreas: Unremarkable  Spleen: Unremarkable  Adrenals/Urinary Tract: 6 cm cyst off the interpolar region of the left kidney. 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 2 mm nonobstructing stone within the interpolar region of the right kidney (image 48; series 5). No ureterolithiasis. No hydronephrosis.  Stomach/Bowel: No abnormal bowel wall thickening or evidence for bowel obstruction. The appendix is normal. No free fluid or free intraperitoneal air.  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 Scattered calcified atherosclerotic plaque involving the abdominal aorta. Focal dilatation of the right common iliac artery measuring 2 cm.  Reproductive: Status post hysterectomy.  Other: There is a 2 cm subcutaneous mass within the soft tissues overlying the inferior aspect of the sternum (image 36; series 2). Fat containing periumbilical hernia.  Musculoskeletal: Small  patchy lucencies within the left and right ilium measuring up to 1.1 cm within the left ilium (image 95; series 2). Additionally there is a 10 mm patchy lucent lesion within T2 vertebral body (image 9; series 2).  IMPRESSION: 1. Left lower lobe mass concerning for primary lung malignancy. There is an additional adjacent satellite nodule within the left lower lobe as well as peripheral ground-glass and consolidative opacities. The opacities may represent postobstructive pneumonitis or additional sites of disease. Small left-sided pleural effusion, malignant effusion is not excluded. 2. Left hilar and AP window adenopathy concerning for metastatic disease. 3. Multiple additional bilateral pulmonary nodules as detailed above. In the setting of primary lung malignancy, metastatic disease is of concern. 4. Multiple hyperdense lesions within the liver as detailed above which may potentially represent hemangiomas or perfusional anomalies however given primary pulmonary malignancy, metastatic disease is not excluded. Attention on followup or dedicated evaluation with MRI in the nonacute setting is recommended. 5. Lucencies within the right and left ilium as well as the T2 vertebral body, nonspecific, however raising the possibility of osseous metastatic disease. 6. Cholelithiasis 7. Nonobstructing stone interpolar region right kidney. 8. Infrarenal abdominal aortic ectasia and focal dilatation of the right common iliac artery. 9. There is a 2 cm subcutaneous mass within the soft tissues overlying the inferior aspect of the sternum (image 36; series 2). This is favored to represent a sebaceous cyst however clinical correlation is recommended. These results will be called to the ordering clinician  or representative by the Radiologist Assistant, and communication documented in the PACS or zVision Dashboard.   Electronically Signed   By: Lovey Newcomer M.D.   On: 04/19/2014 09:13   Dg Chest Port 1 View  04/19/2014   CLINICAL  DATA:  Post bronchoscopy with biopsy.  EXAM: PORTABLE CHEST - 1 VIEW  COMPARISON:  04/10/2014 as well as CT 04/19/2014  FINDINGS: Lungs are adequately inflated with mild opacification over the left base likely atelectasis. There is evidence of patient's medial left base/ infrahilar mass in the retrocardiac region. Evidence of known left hilar adenopathy. No evidence of pneumothorax. Cardiomediastinal silhouette and remainder of the exam is unchanged.  IMPRESSION: Known medial left basilar lung mass and left hilar adenopathy. No evidence pneumothorax post bronchoscopy/ biopsy. Linear density left base likely atelectasis.   Electronically Signed   By: Marin Olp M.D.   On: 04/19/2014 16:48    MEDICATIONS                                                                                                                       I have reviewed the patient's current medications.  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 Consider decreasing decadron to 4 mg PO every 6 hours. Continue keppra. Neurology will sign off.  Dorian Pod, MD [facility] [phone]  04/20/2014, 9:40 AM

## 2014-04-20 NOTE — Plan of Care (Signed)
Problem: Acute Rehab PT Goals(only PT should resolve) Goal: Patient Will Transfer Sit To/From Stand Outcome: Completed/Met Date Met:  04/20/14 Goal: Pt Will Ambulate Outcome: Completed/Met Date Met:  04/20/14 Goal: Pt Will Go Up/Down Stairs Outcome: Completed/Met Date Met:  04/20/14

## 2014-04-20 NOTE — Progress Notes (Signed)
Physical Therapy Treatment Patient Details Name: Michelle Cobb MRN: [id number] DOB: 1940/07/14 Today's Date: 04/20/2014    History of Present Illness Patient is a 73 y/o female with PMH of HTn and HLD presenting with complaints of numbness involving left side of her face and left upper extremity for 4-6 weeks and recurrent episodes of involuntary twitching of her left upper extremity.  CT scan of the head showed what appeared to be a mass lesion with surrounding vasogenic edema involving the left parietal temporal region, as well as a second area of probable vasogenic edema involving the right corona radiata likely metastatic. 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     PT Comments    Patient is independent with all mobility and gait.  Good balance with high level balance activities.  Patient has achieved all PT goals.  No further acute PT needs identified - PT will sign off.    Follow Up Recommendations  No PT follow up;Supervision - Intermittent     Equipment Recommendations  None recommended by PT    Recommendations for Other Services       Precautions / Restrictions Precautions Precautions: None Restrictions Weight Bearing Restrictions: No    Mobility  Bed Mobility                  Transfers Overall transfer level: Independent Equipment used: None Transfers: Sit to/from Stand Sit to Stand: Independent         General transfer comment: No assist required.  Ambulation/Gait Ambulation/Gait assistance: Independent Ambulation Distance (Feet): 200 Feet Assistive device: None Gait Pattern/deviations: WFL(Within Functional Limits)   Gait velocity interpretation: at or above normal speed for age/gender General Gait Details: Patient demonstrates good gait pattern, speed, and balance.    Stairs            Wheelchair Mobility    Modified Rankin (Stroke Patients Only)       Balance                              High level balance activites: Direction changes;Turns;Sudden stops;Head turns (Stepping over obstacles; walking around obstacles) High Level Balance Comments: No loss of balance with any high level balance activity    Cognition Arousal/Alertness: Awake/alert Behavior During Therapy: WFL for tasks assessed/performed Overall Cognitive Status: Within Functional Limits for tasks assessed                      Exercises      General Comments        Pertinent Vitals/Pain Pain Assessment: No/denies pain    Home Living Family/patient expects to be discharged to:: Private residence Living Arrangements: Alone   Type of Home: House Home Access: Stairs to enter Entrance Stairs-Rails: None Home Layout: One level Home Equipment: None      Prior Function Level of Independence: Independent      Comments: Very active PTA. Drives, does cooking/cleaning.    PT Goals (current goals can now be found in the care plan section) Acute Rehab PT Goals Patient Stated Goal: home when ready Progress towards PT goals: Goals met/education completed, patient discharged from PT    Frequency  Min 3X/week    PT Plan Current plan remains appropriate    Co-evaluation             End of Session   Activity Tolerance: Patient tolerated treatment well Patient left: in chair;with call  bell/phone within reach     Time: 0939-0949 PT Time Calculation (min) (ACUTE ONLY): 10 min  Charges:  $Gait Training: 8-22 mins                    G Codes:      Michelle Cobb May 08, 2014, 10:38 AM Michelle Cobb. Michelle Cobb, Michelle Cobb Pager [phone]

## 2014-04-22 ENCOUNTER — Ambulatory Visit: Payer: Medicare Other | Attending: Radiation Oncology | Admitting: Radiation Oncology

## 2014-04-22 ENCOUNTER — Encounter (HOSPITAL_COMMUNITY): Payer: Self-pay | Admitting: Pulmonary Disease

## 2014-04-22 ENCOUNTER — Other Ambulatory Visit: Payer: Self-pay | Admitting: Radiation Oncology

## 2014-04-22 DIAGNOSIS — R918 Other nonspecific abnormal finding of lung field: Secondary | ICD-10-CM

## 2014-04-22 DIAGNOSIS — C7931 Secondary malignant neoplasm of brain: Secondary | ICD-10-CM

## 2014-04-22 DIAGNOSIS — R911 Solitary pulmonary nodule: Secondary | ICD-10-CM

## 2014-04-22 LAB — CULTURE, BAL-QUANTITATIVE W GRAM STAIN
Colony Count: NO GROWTH
Culture: NO GROWTH

## 2014-04-23 ENCOUNTER — Telehealth: Payer: Self-pay | Admitting: *Deleted

## 2014-04-23 ENCOUNTER — Encounter: Payer: Self-pay | Admitting: *Deleted

## 2014-04-23 NOTE — [facility] Oncology Navigator Note (Unsigned)
Called IR to see when Ct Biopsy is scheduled.  I left vm message for Michelle Cobb to call me.

## 2014-04-23 NOTE — Telephone Encounter (Signed)
Called patient to see if she is aware of appt for biopsy.  She stated yes.  She stated she has transportation her godson is coming in town to help.  I asked that she call the cancer center if she is unable to get a ride.  She stated she would.

## 2014-04-23 NOTE — Telephone Encounter (Signed)
CALLED PATIENT TO INFORM OF CT BIOPSY ON 04-25-14 AND HER PET ON 04-30-14, SPOKE WITH PATIENT AND SHE IS AWARE OF THESE TESTS.

## 2014-04-24 ENCOUNTER — Other Ambulatory Visit: Payer: Self-pay | Admitting: Radiology

## 2014-04-25 ENCOUNTER — Encounter (HOSPITAL_COMMUNITY): Payer: Self-pay

## 2014-04-25 ENCOUNTER — Ambulatory Visit (HOSPITAL_COMMUNITY)
Admission: RE | Admit: 2014-04-25 | Discharge: 2014-04-25 | Disposition: A | Discharge status: Home / Self Care | Payer: Medicare Other | Source: Ambulatory Visit | Attending: Diagnostic Radiology | Admitting: Diagnostic Radiology

## 2014-04-25 ENCOUNTER — Ambulatory Visit (HOSPITAL_COMMUNITY)
Admission: RE | Admit: 2014-04-25 | Discharge: 2014-04-25 | Disposition: A | Discharge status: Home / Self Care | Payer: Medicare Other | Source: Ambulatory Visit | Attending: Radiation Oncology | Admitting: Radiation Oncology

## 2014-04-25 DIAGNOSIS — Z7982 Long term (current) use of aspirin: Secondary | ICD-10-CM | POA: Diagnosis not present

## 2014-04-25 DIAGNOSIS — Z87891 Personal history of nicotine dependence: Secondary | ICD-10-CM | POA: Insufficient documentation

## 2014-04-25 DIAGNOSIS — E785 Hyperlipidemia, unspecified: Secondary | ICD-10-CM | POA: Diagnosis not present

## 2014-04-25 DIAGNOSIS — Z79899 Other long term (current) drug therapy: Secondary | ICD-10-CM | POA: Insufficient documentation

## 2014-04-25 DIAGNOSIS — R918 Other nonspecific abnormal finding of lung field: Secondary | ICD-10-CM | POA: Diagnosis present

## 2014-04-25 DIAGNOSIS — C3432 Malignant neoplasm of lower lobe, left bronchus or lung: Secondary | ICD-10-CM | POA: Diagnosis not present

## 2014-04-25 DIAGNOSIS — I10 Essential (primary) hypertension: Secondary | ICD-10-CM | POA: Insufficient documentation

## 2014-04-25 DIAGNOSIS — Z9889 Other specified postprocedural states: Secondary | ICD-10-CM

## 2014-04-25 DIAGNOSIS — C7931 Secondary malignant neoplasm of brain: Secondary | ICD-10-CM

## 2014-04-25 HISTORY — DX: Unspecified injury of unspecified lower leg, initial encounter: S89.90XA

## 2014-04-25 LAB — CBC
HCT: 43.1 % (ref 36.0–46.0)
HEMOGLOBIN: 15.2 g/dL — AB (ref 12.0–15.0)
MCH: 25 pg — ABNORMAL LOW (ref 26.0–34.0)
MCHC: 35.3 g/dL (ref 30.0–36.0)
MCV: 70.8 fL — AB (ref 78.0–100.0)
PLATELETS: 342 10*3/uL (ref 150–400)
RBC: 6.09 MIL/uL — AB (ref 3.87–5.11)
RDW: 14 % (ref 11.5–15.5)
WBC: 19.2 10*3/uL — ABNORMAL HIGH (ref 4.0–10.5)

## 2014-04-25 LAB — APTT: aPTT: 26 seconds (ref 24–37)

## 2014-04-25 LAB — PROTIME-INR
INR: 1.06 (ref 0.00–1.49)
PROTHROMBIN TIME: 13.9 s (ref 11.6–15.2)

## 2014-04-25 MED ORDER — LEVETIRACETAM 500 MG PO TABS
1000.0000 mg | ORAL_TABLET | Freq: Once | ORAL | Status: AC
  Administered 2014-04-25: 1000 mg via ORAL
  Filled 2014-04-25: qty 2

## 2014-04-25 MED ORDER — FENTANYL CITRATE 0.05 MG/ML IJ SOLN
INTRAMUSCULAR | Status: AC
  Filled 2014-04-25: qty 4

## 2014-04-25 MED ORDER — FENTANYL CITRATE 0.05 MG/ML IJ SOLN
INTRAMUSCULAR | Status: AC | PRN
Start: 2014-04-25 — End: 2014-04-25
  Administered 2014-04-25 (×2): 25 ug via INTRAVENOUS

## 2014-04-25 MED ORDER — MIDAZOLAM HCL 2 MG/2ML IJ SOLN
INTRAMUSCULAR | Status: AC
  Filled 2014-04-25: qty 6

## 2014-04-25 MED ORDER — SODIUM CHLORIDE 0.9 % IV SOLN
INTRAVENOUS | Status: DC
  Administered 2014-04-25: 20 mL/h via INTRAVENOUS

## 2014-04-25 MED ORDER — MIDAZOLAM HCL 2 MG/2ML IJ SOLN
INTRAMUSCULAR | Status: AC | PRN
  Administered 2014-04-25: 1 mg via INTRAVENOUS
  Administered 2014-04-25: 0.5 mg via INTRAVENOUS

## 2014-04-25 MED ORDER — HYDROCODONE-ACETAMINOPHEN 5-325 MG PO TABS
1.0000 | ORAL_TABLET | ORAL | Status: DC | PRN
  Filled 2014-04-25: qty 2

## 2014-04-25 NOTE — Procedures (Signed)
CT guided core biopsies of left lower lobe mass.  2 cores obtained.  No immediate complication.

## 2014-04-25 NOTE — H&P (Signed)
Chief Complaint: "I am here for a biopsy."  Referring Physician(s): Manning,Matthew A  History of Present Illness: Michelle Cobb is a 73 y.o. female who was previous tobacco user quit 1 year ago. She presented originally to hospital on 04/18/14 with left sided numbness and possible seizures. 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 She is now on decadron and keppra for edema and seizures, she denies any further seizures since hospital. She underwent a bronchoscopy on 04/19/14 pathology was benign. She is scheduled for CT guided LLL lung mass biopsy with moderate sedation today. She denies any chest pain, shortness of breath or palpitations. She denies any active signs of bleeding or excessive bruising. She denies any recent fever or chills. The patient denies any history of sleep apnea or chronic oxygen use. She has previously tolerated sedation without complications.    Past Medical History  Diagnosis Date  . Hypertension   . Hyperlipidemia   . Knee injury 1983    left    Past Surgical History  Procedure Laterality Date  . Video bronchoscopy Bilateral 04/19/2014    Procedure: VIDEO BRONCHOSCOPY WITHOUT FLUORO;  Surgeon: Rigoberto Noel, MD;  Location: [facility];  Service: Cardiopulmonary;  Laterality: Bilateral;  . Abdominal hysterectomy  1983    Allergies: Review of patient's allergies indicates no known allergies.  Medications: Prior to Admission medications   Medication Sig Start Date End Date Taking? Authorizing Provider  aspirin 81 MG tablet Take 81 mg by mouth daily.   Yes Historical Provider, MD  atorvastatin (LIPITOR) 10 MG tablet Take 10 mg by mouth daily. 02/18/14  Yes Historical Provider, MD  cholecalciferol (VITAMIN D) 1000 UNITS tablet Take 1,000 Units by mouth daily.   Yes Historical Provider, MD  dexamethasone (DECADRON) 4 MG tablet Take 10.5 mg by mouth 3 (three) times daily.   Yes Historical Provider, MD  hydrochlorothiazide (HYDRODIURIL) 25  MG tablet Take 25 mg by mouth every morning.    Yes Historical Provider, MD  levETIRAcetam (KEPPRA) 1000 MG tablet Take 1 tablet (1,000 mg total) by mouth 2 (two) times daily. 04/20/14  Yes Nita Sells, MD  dexamethasone (DECADRON) 4 MG tablet Take 1 tablet (4 mg total) by mouth 4 (four) times daily. Patient not taking: Reported on 04/23/2014 04/20/14   Nita Sells, MD    History reviewed. No pertinent family history.  History   Social History  . Marital Status: Single    Spouse Name: N/A    Number of Children: N/A  . Years of Education: N/A   Social History Main Topics  . Smoking status: Former Research scientist (life sciences)  . Smokeless tobacco: Current User  . Alcohol Use: Yes     Comment: 2 oz/week  . Drug Use: No  . Sexual Activity: None   Other Topics Concern  . None   Social History Narrative    Review of Systems: A 12 point ROS discussed and pertinent positives are indicated in the HPI above.  All other systems are negative.  Review of Systems  Vital Signs: BP 127/72 mmHg  Pulse 64  Temp(Src) 98.3 F (36.8 C) (Oral)  Resp 19  Physical Exam  Constitutional: She is oriented to person, place, and time. No distress.  HENT:  Head: Normocephalic and atraumatic.  Neck: No tracheal deviation present.  Cardiovascular: Normal rate and regular rhythm.  Exam reveals no gallop and no friction rub.   No murmur heard. Pulmonary/Chest: Effort normal and breath sounds normal. No respiratory distress. She has  no wheezes. She has no rales.  Abdominal: Soft. Bowel sounds are normal. She exhibits no distension. There is no tenderness.  Neurological: She is alert and oriented to person, place, and time.  Skin: She is not diaphoretic.  Psychiatric: She has a normal mood and affect. Her behavior is normal. Thought content normal.    Imaging: See chart review, imaging section.   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 Anselm Pancoast has spoke with Dr. Burr Medico who wishes to proceed with biopsy prior to PET to establish diagnosis and get started with radiation treatment Patient ate at 0545, will wait until this afternoon and perform lung biopsy, labs reviewed, no blood thinners taken Risks and Benefits discussed with the patient. All of the patient's questions were answered, patient is agreeable to proceed. Consent signed and in chart.   SignedHedy Jacob 04/25/2014, 9:48 AM

## 2014-04-25 NOTE — Discharge Instructions (Signed)
Needle Biopsy of Lung, Care After Refer to this sheet in the next few weeks. These instructions provide you with information on caring for yourself after your procedure. Your health care provider may also give you more specific instructions. Your treatment has been planned according to current medical practices, but problems sometimes occur. Call your health care provider if you have any problems or questions after your procedure. WHAT TO EXPECT AFTER THE PROCEDURE  A bandage will be applied over the area where the needle was inserted. You may be asked to apply pressure to the bandage for several minutes to ensure there is minimal bleeding.  In most cases, you can leave when your needle biopsy procedure is completed. Do not drive yourself home. Someone else should take you home.  If you received an IV sedative or general anesthetic, you will be taken to a comfortable place to relax while the medicine wears off.  If you have upcoming travel scheduled, talk to your health care provider about when it is safe to travel by air after the procedure. HOME CARE INSTRUCTIONS  Expect to take it easy for the rest of the day.  Protect the area where you received the needle biopsy by keeping the bandage in place for as long as instructed.  You may feel some mild pain or discomfort in the area, but this should stop in a day or two.  Take medicines only as directed by your health care provider. SEEK MEDICAL CARE IF:   You have pain at the biopsy site that worsens or is not helped by medicine.  You have swelling or drainage at the needle biopsy site.  You have a fever. SEEK IMMEDIATE MEDICAL CARE IF:   You have new or worsening shortness of breath.  You have chest pain.  You are coughing up blood.  You have bleeding that does not stop with pressure or a bandage.  You develop light-headedness or fainting. Document Released: 03/07/2007 Document Revised: 09/24/2013 Document Reviewed:  10/02/2012 [facility] Patient Information 2015 [place], [place]. This information is not intended to replace advice given to you by your health care provider. Make sure you discuss any questions you have with your health care provider. Conscious Sedation Sedation is the use of medicines to promote relaxation and relieve discomfort and anxiety. Conscious sedation is a type of sedation. Under conscious sedation you are less alert than normal but are still able to respond to instructions or stimulation. Conscious sedation is used during short medical and dental procedures. It is milder than deep sedation or general anesthesia and allows you to return to your regular activities sooner.  LET [facility] CARE PROVIDER KNOW ABOUT:   Any allergies you have.  All medicines you are taking, including vitamins, herbs, eye drops, creams, and over-the-counter medicines.  Use of steroids (by mouth or creams).  Previous problems you or members of your family have had with the use of anesthetics.  Any blood disorders you have.  Previous surgeries you have had.  Medical conditions you have.  Possibility of pregnancy, if this applies.  Use of cigarettes, alcohol, or illegal drugs. RISKS AND COMPLICATIONS Generally, this is a safe procedure. However, as with any procedure, problems can occur. Possible problems include:  Oversedation.  Trouble breathing on your own. You may need to have a breathing tube until you are awake and breathing on your own.  Allergic reaction to any of the medicines used for the procedure. BEFORE THE PROCEDURE  You may have blood tests done.  These tests can help show how well your kidneys and liver are working. They can also show how well your blood clots.  A physical exam will be done.  Only take medicines as directed by your health care provider. You may need to stop taking medicines (such as blood thinners, aspirin, or nonsteroidal anti-inflammatory drugs) before the  procedure.   Do not eat or drink at least 6 hours before the procedure or as directed by your health care provider.  Arrange for a responsible adult, family member, or friend to take you home after the procedure. He or she should stay with you for at least 24 hours after the procedure, until the medicine has worn off. PROCEDURE   An intravenous (IV) catheter will be inserted into one of your veins. Medicine will be able to flow directly into your body through this catheter. You may be given medicine through this tube to help prevent pain and help you relax.  The medical or dental procedure will be done. AFTER THE PROCEDURE  You will stay in a recovery area until the medicine has worn off. Your blood pressure and pulse will be checked.   Depending on the procedure you had, you may be allowed to go home when you can tolerate liquids and your pain is under control. Document Released: 02/02/2001 Document Revised: 05/15/2013 Document Reviewed: 01/15/2013 [facility] Patient Information 2015 [place], [place]. This information is not intended to replace advice given to you by your health care provider. Make sure you discuss any questions you have with your health care provider. Conscious Sedation, Adult, Care After Refer to this sheet in the next few weeks. These instructions provide you with information on caring for yourself after your procedure. Your health care provider may also give you more specific instructions. Your treatment has been planned according to current medical practices, but problems sometimes occur. Call your health care provider if you have any problems or questions after your procedure. WHAT TO EXPECT AFTER THE PROCEDURE  After your procedure:  You may feel sleepy, clumsy, and have poor balance for several hours.  Vomiting may occur if you eat too soon after the procedure. HOME CARE INSTRUCTIONS  Do not participate in any activities where you could become injured for at least  24 hours. Do not:  Drive.  Swim.  Ride a bicycle.  Operate heavy machinery.  Cook.  Use power tools.  Climb ladders.  Work from a high place.  Do not make important decisions or sign legal documents until you are improved.  If you vomit, drink water, juice, or soup when you can drink without vomiting. Make sure you have little or no nausea before eating solid foods.  Only take over-the-counter or prescription medicines for pain, discomfort, or fever as directed by your health care provider.  Make sure you and your family fully understand everything about the medicines given to you, including what side effects may occur.  You should not drink alcohol, take sleeping pills, or take medicines that cause drowsiness for at least 24 hours.  If you smoke, do not smoke without supervision.  If you are feeling better, you may resume normal activities 24 hours after you were sedated.  Keep all appointments with your health care provider. SEEK MEDICAL CARE IF:  Your skin is pale or bluish in color.  You continue to feel nauseous or vomit.  Your pain is getting worse and is not helped by medicine.  You have bleeding or swelling.  You are still  sleepy or feeling clumsy after 24 hours. SEEK IMMEDIATE MEDICAL CARE IF:  You develop a rash.  You have difficulty breathing.  You develop any type of allergic problem.  You have a fever. MAKE SURE YOU:  Understand these instructions.  Will watch your condition.  Will get help right away if you are not doing well or get worse. Document Released: 02/28/2013 Document Reviewed: 02/28/2013 [facility] Patient Information 2015 [place], [place]. This information is not intended to replace advice given to you by your health care provider. Make sure you discuss any questions you have with your health care provider.

## 2014-04-29 ENCOUNTER — Other Ambulatory Visit: Payer: Self-pay | Admitting: Radiation Therapy

## 2014-04-29 DIAGNOSIS — C7931 Secondary malignant neoplasm of brain: Secondary | ICD-10-CM

## 2014-04-29 DIAGNOSIS — C7949 Secondary malignant neoplasm of other parts of nervous system: Principal | ICD-10-CM

## 2014-04-30 ENCOUNTER — Ambulatory Visit: Payer: Medicare Other

## 2014-04-30 ENCOUNTER — Telehealth: Payer: Self-pay | Admitting: Hematology

## 2014-04-30 ENCOUNTER — Encounter: Payer: Self-pay | Admitting: Hematology

## 2014-04-30 ENCOUNTER — Ambulatory Visit (HOSPITAL_COMMUNITY)
Admission: RE | Admit: 2014-04-30 | Discharge: 2014-04-30 | Disposition: A | Discharge status: Home / Self Care | Payer: Medicare Other | Source: Ambulatory Visit | Attending: Radiation Oncology | Admitting: Radiation Oncology

## 2014-04-30 ENCOUNTER — Ambulatory Visit (HOSPITAL_BASED_OUTPATIENT_CLINIC_OR_DEPARTMENT_OTHER): Payer: Medicare Other | Admitting: Hematology

## 2014-04-30 VITALS — BP 118/70 | HR 88 | Temp 98.1°F | Resp 18 | Ht 69.0 in | Wt 199.5 lb

## 2014-04-30 DIAGNOSIS — C3490 Malignant neoplasm of unspecified part of unspecified bronchus or lung: Secondary | ICD-10-CM | POA: Insufficient documentation

## 2014-04-30 DIAGNOSIS — I719 Aortic aneurysm of unspecified site, without rupture: Secondary | ICD-10-CM | POA: Diagnosis not present

## 2014-04-30 DIAGNOSIS — K8020 Calculus of gallbladder without cholecystitis without obstruction: Secondary | ICD-10-CM | POA: Diagnosis not present

## 2014-04-30 DIAGNOSIS — C782 Secondary malignant neoplasm of pleura: Secondary | ICD-10-CM | POA: Insufficient documentation

## 2014-04-30 DIAGNOSIS — R918 Other nonspecific abnormal finding of lung field: Secondary | ICD-10-CM | POA: Diagnosis not present

## 2014-04-30 DIAGNOSIS — C7800 Secondary malignant neoplasm of unspecified lung: Secondary | ICD-10-CM

## 2014-04-30 DIAGNOSIS — C787 Secondary malignant neoplasm of liver and intrahepatic bile duct: Secondary | ICD-10-CM

## 2014-04-30 DIAGNOSIS — I700 Atherosclerosis of aorta: Secondary | ICD-10-CM | POA: Insufficient documentation

## 2014-04-30 DIAGNOSIS — C3432 Malignant neoplasm of lower lobe, left bronchus or lung: Secondary | ICD-10-CM

## 2014-04-30 DIAGNOSIS — C7931 Secondary malignant neoplasm of brain: Secondary | ICD-10-CM

## 2014-04-30 LAB — GLUCOSE, CAPILLARY: GLUCOSE-CAPILLARY: 82 mg/dL (ref 70–99)

## 2014-04-30 MED ORDER — FLUDEOXYGLUCOSE F - 18 (FDG) INJECTION
11.0000 | Freq: Once | INTRAVENOUS | Status: AC | PRN
Start: 2014-04-30 — End: 2014-04-30
  Administered 2014-04-30: 11 via INTRAVENOUS

## 2014-04-30 MED ORDER — DEXAMETHASONE 4 MG PO TABS: 4.0000 mg | ORAL_TABLET | Freq: Two times a day (BID) | ORAL | Status: DC

## 2014-04-30 NOTE — Progress Notes (Signed)
[place]  Telephone:([phone]) [phone] Fax:([phone]) [place] Note   Patient Care Team: Christain Sacramento, MD as PCP - General (Family Medicine) 04/30/2014  CHIEF COMPLAINTS/PURPOSE OF CONSULTATION:  Newly diagnosed lung cancer   HISTORY OF PRESENTING ILLNESS:  Michelle Cobb 73 y.o. African American female with past medical history of hypertension, dyslipidemia, heavy smoking, is here because of newly diagnosed lung cancer. I initially saw her when she was admitted to [facility], and she is here today for follow-up.  She presented to [facility] on 04/18/2014 with left side numbness for over a month, and a possible seizure. She noticed a left arm numbness, and intermittent headches for the past several weeks, no significant weakness. No significant cough, chest pain, hemoptysis, or dyspnea. Her energy level and appetite has not been changed lately. She reports an episode of shaking of her left arm and shoulder for a few minutes as on 03/28/2014 and again on the day ofadmission. No loss of consciousness.MRI of brain reviewed to brain lesions at the right parietal and left frontal lobe with significant surrounding edema. CT of the chest reviewed large left lower lobe mass, hilar and AP window adenopathy. She underwent a bronchoscopy by Dr. Mickeal Needy in the hospital, no endobronchial lesion was found, transbronchial needle biopsy of the left lower lobe mass was obtained, but no malignant cell was found.  She was discharged home after bronchoscopy, and underwent outpatient CT-guided needle biopsy on 12/3 2015, which revealed non-small cell carcinoma.  She was seen by neurology service during her hospital stay and started on Keppra. She was also put on dexamethasone, and was discharged to home on 10.5 mg 3 times a day. She however ran out dexamethasone 3 days ago.   She states her left arm numbness is stable, no more episodes of seizure. Her headaches has  improved. She was seen by radiation oncologist Dr. [name] and is planned to have stereotactic brain radiation.   MEDICAL HISTORY:  Past Medical History  Diagnosis Date  . Hypertension   . Hyperlipidemia   . Knee injury 1983    left    SURGICAL HISTORY: Past Surgical History  Procedure Laterality Date  . Video bronchoscopy Bilateral 04/19/2014    Procedure: VIDEO BRONCHOSCOPY WITHOUT FLUORO;  Surgeon: Rigoberto Noel, MD;  [facility];  Service: Cardiopulmonary;  Laterality: Bilateral;  . Abdominal hysterectomy  1983    SOCIAL HISTORY: History   Social History  . Marital Status: Single    Spouse Name: N/A    Number of Children: N/A  . Years of Education: N/A   Occupational History  . Not on file.   Social History Main Topics  . Smoking status: Former Research scientist (life sciences)  . Smokeless tobacco: Current User  . Alcohol Use: Yes     Comment: 2 oz/week  . Drug Use: No  . Sexual Activity: Not on file   Other Topics Concern  . Not on file   Social History Narrative    FAMILY HISTORY: No family history on file.  ALLERGIES:  has No Known Allergies.  MEDICATIONS:  Current Outpatient Prescriptions  Medication Sig Dispense Refill  . aspirin 81 MG tablet Take 81 mg by mouth daily.    Marland Kitchen atorvastatin (LIPITOR) 10 MG tablet Take 10 mg by mouth daily.  0  . cholecalciferol (VITAMIN D) 1000 UNITS tablet Take 1,000 Units by mouth daily.    Marland Kitchen dexamethasone (DECADRON) 4 MG tablet Take 1 tablet (4 mg  total) by mouth 4 (four) times daily. (Patient not taking: Reported on 04/23/2014) 45 tablet 0  . dexamethasone (DECADRON) 4 MG tablet Take 10.5 mg by mouth 3 (three) times daily.    . hydrochlorothiazide (HYDRODIURIL) 25 MG tablet Take 25 mg by mouth every morning.     . levETIRAcetam (KEPPRA) 1000 MG tablet Take 1 tablet (1,000 mg total) by mouth 2 (two) times daily. 60 tablet 0   No current facility-administered medications for this visit.    REVIEW OF SYSTEMS:   Constitutional:  Denies fevers, chills or abnormal night sweats Eyes: Denies blurriness of vision, double vision or watery eyes Ears, nose, mouth, throat, and face: Denies mucositis or sore throat Respiratory: Denies cough, dyspnea or wheezes Cardiovascular: Denies palpitation, chest discomfort or lower extremity swelling Gastrointestinal:  Denies nausea, heartburn or change in bowel habits Skin: Denies abnormal skin rashes Lymphatics: Denies new lymphadenopathy or easy bruising Neurological: see HPI  Behavioral/Psych: Mood is stable, no new changes  All other systems were reviewed with the patient and are negative.  PHYSICAL EXAMINATION: ECOG PERFORMANCE STATUS: 1 - Symptomatic but completely ambulatory  There were no vitals filed for this visit. There were no vitals filed for this visit.  GENERAL:alert, no distress and comfortable SKIN: skin color, texture, turgor are normal, no rashes or significant lesions EYES: normal, conjunctiva are pink and non-injected, sclera clear OROPHARYNX:no exudate, no erythema and lips, buccal mucosa, and tongue normal  NECK: supple, thyroid normal size, non-tender, without nodularity LYMPH:  no palpable lymphadenopathy in the cervical, axillary or inguinal LUNGS: clear to auscultation and percussion with normal breathing effort HEART: regular rate & rhythm and no murmurs and no lower extremity edema ABDOMEN:abdomen soft, non-tender and normal bowel sounds Musculoskeletal:no cyanosis of digits and no clubbing  PSYCH: alert & oriented x 3 with fluent speech NEURO: no focal motor/sensory deficits  LABORATORY DATA:  I have reviewed the data as listed CBC Latest Ref Rng 04/25/2014 04/18/2014  WBC 4.0 - 10.5 K/uL 19.2(H) 9.3  Hemoglobin 12.0 - 15.0 g/dL 15.2(H) 12.5  Hematocrit 36.0 - 46.0 % 43.1 36.1  Platelets 150 - 400 K/uL 342 281    CMP Latest Ref Rng 04/18/2014  Glucose 70 - 99 mg/dL 110(H)  BUN 6 - 23 mg/dL 10  Creatinine 0.50 - 1.10 mg/dL 0.77  Sodium 137  - 147 mEq/L 140  Potassium 3.7 - 5.3 mEq/L 3.2(L)  Chloride 96 - 112 mEq/L 100  CO2 19 - 32 mEq/L 27  Calcium 8.4 - 10.5 mg/dL 8.5  Total Protein 6.0 - 8.3 g/dL 7.1  Total Bilirubin 0.3 - 1.2 mg/dL 0.3  Alkaline Phos 39 - 117 U/L 73  AST 0 - 37 U/L 14  ALT 0 - 35 U/L 11   PATHOLOGY REPORT 04/25/2014 Lung, needle/core biopsy(ies), LLL - NON SMALL CELL CARCINOMA. Microscopic Comment Immunohistochemistry will be performed and reported as an addendum. (JDP:gt, 04/26/14) ADDENDUM: Immunohistochemistry is performed and the tumor is positive with thyroid transcription factor-1 (TTF-1), and shows focal positivity with cytokeratin 5/6. The immunophenotype is consistent with primary lung adenocarcinoma. (JDP:gt, 04/29/14)    RADIOGRAPHIC STUDIES: I have personally reviewed the radiological images as listed and agreed with the findings in the report.  Mr Jeri Cos Wo Contrast 04/19/2014      IMPRESSION:  1. Hemorrhagic mass lesion in the high right parietal lobe measures 19 x 14 x 17 mm.  2. Enhancing mass lesion in the posterior left frontal lobe white matter measures 15 x 16 x  13 mm.  3. There is significant surrounding vasogenic edema with both lesions. These are most compatible with metastases.  4. Moderate generalized atrophy and diffuse white matter disease. This likely reflects the sequelae of chronic microvascular ischemia.      Ct Chest Abdomen Pelvis W Contrast 04/19/2014  FINDINGS: CT CHEST FINDINGS  Visualized thyroid is unremarkable. No enlarged axillary lymphadenopathy. There is a 1.5 cm AP window lymph node (image 26; series 2). Left hilar lymphadenopathy measuring up to 1.6 cm (image 34; series 2).  There is a 6.0 x 5.3 x 4.7 cm left lower lobe mass which has broad-based abutment to the left aspect of the mediastinum. This mass causes occlusion of the distal aspect of the left lower lobe bronchi. There is patchy ground-glass opacity within the peripheral aspect of the left lower  lobe. There is an additional left lower lobe nodule measuring 1.1 cm (image 44; series 2).  Additional scattered bilateral pulmonary nodules are detailed as follows including in 8 mm right upper lobe nodule (image 27; series 3); a 5 mm right middle lobe nodule (image 49; series 3) ; a 1.1 cm ground-glass nodule within the right lower lobe (image 49; series 3) ; 4 mm left upper lobe pulmonary nodule (image 22; series 3); and a 5 mm right lower lobe nodule (image 41; series 3). Two adjacent subpleural opacities representing either focal nodules or atelectasis within the left lower lobe (image 27; series 2) and (image 32; series 2). Scattered centrilobular emphysematous change. Left lower hemi thorax pleural thickening versus fluid.  CT ABDOMEN AND PELVIS FINDINGS  Hepatobiliary: There is a 1 cm hyperenhancing lesion within the hepatic dome (image 54; series 2 and image 37; series 5). Additionally there is suggestion of a 1.5 cm lesion within the right hepatic lobe (image 70; series 2). Suggestion of an additional hyperdense lesion within the hepatic dome measuring 1.3 cm (image 60; series 2). Multiple gallstones. No gallbladder wall thickening.  Pancreas: Unremarkable  Spleen: Unremarkable  Adrenals/Urinary Tract: 6 cm cyst off the interpolar region of the left kidney. 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 2 mm nonobstructing stone within the interpolar region of the right kidney (image 48; series 5). No ureterolithiasis. No hydronephrosis.  Stomach/Bowel: No abnormal bowel wall thickening or evidence for bowel obstruction. The appendix is normal. No free fluid or free intraperitoneal air.  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 Scattered calcified atherosclerotic plaque involving the abdominal aorta. Focal dilatation of the right common iliac artery measuring 2 cm.  Reproductive: Status post  hysterectomy.  Other: There is a 2 cm subcutaneous mass within the soft tissues overlying the inferior aspect of the sternum (image 36; series 2). Fat containing periumbilical hernia.  Musculoskeletal: Small patchy lucencies within the left and right ilium measuring up to 1.1 cm within the left ilium (image 95; series 2). Additionally there is a 10 mm patchy lucent lesion within T2 vertebral body (image 9; series 2).  IMPRESSION:  1. Left lower lobe mass concerning for primary lung malignancy. There is an additional adjacent satellite nodule within the left lower lobe as well as peripheral ground-glass and consolidative opacities. The opacities may represent postobstructive pneumonitis or additional sites of disease. Small left-sided pleural effusion, malignant effusion is not excluded.  2. Left hilar and AP window adenopathy concerning for metastatic disease.  3. Multiple additional bilateral pulmonary nodules as detailed above. In the setting of primary lung  malignancy, metastatic disease is of concern.  4. Multiple hyperdense lesions within the liver as detailed above which may potentially represent hemangiomas or perfusional anomalies however given primary pulmonary malignancy, metastatic disease is not excluded. Attention on followup or dedicated evaluation with MRI in the nonacute setting is recommended.  5. Lucencies within the right and left ilium as well as the T2 vertebral body, nonspecific, however raising the possibility of osseous metastatic disease.  6. Cholelithiasis  7. Nonobstructing stone interpolar region right kidney.  8. Infrarenal abdominal aortic ectasia and focal dilatation of the right common iliac artery.  9. There is a 2 cm subcutaneous mass within the soft tissues overlying the inferior aspect of the sternum (image 36; series 2). This is favored to represent a sebaceous cyst however clinical correlation is recommended. These results will be called to the ordering clinician or  representative by the Radiologist Assistant, and communication documented in the PACS or zVision Dashboard.      Ct Biopsy 04/25/2014     IMPRESSION: CT-guided core biopsies of the left lower lobe mass.   Electronically Signed   By: Markus Daft M.D.   On: 04/25/2014 17:20     ASSESSMENT & PLAN:  A 72 year old African-American female with past medical history of hypertension, dyslipidemia, have a smoking, who presents left-sided numbness and possible seizure episodes. A CT of chest reviewed a large left upper lobe lung mass, hilar and mediastinal adenopathy, and 2 brain lesions with significant surrounding edema. 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 CT-guided lung mass biopsy showed TTF-1 positive non-small cell carcinoma, consistent with primary lung adenocarcinoma.  1. 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 -I reviewed her lung mass biopsy pathology and her CT scan findings with her in details. She is scheduled to have a PET scan later today, which will give [place] more details about her disease burden.  -Unfortunately she has incurable metastatic lung cancer, overall prognosis is very poor. I discussed this is not curable, and treatment goal is palliation to prolong her life and preserve her quality of life. -I agree with brain radiation first, followed by systemic therapy. -Giving her at internal carcinoma histology, I'll obtain molecular testing including EGFR, ALK, ROS-1, etc. I discussed with pathologist. Her biopsy sample is very limited, I recommend Foundation one molecular testing. The turnaround time of the test is about 3 weeks. -Based on her Foundation one results, I'll recommend targeted therapy or chemotherapy. -She lives alone, is due to hesitate to take systemic chemotherapy due to her limited social support. I encouraged her to talk to our Education officer, museum.  Plan #1 week before PET CT scan results. #2  she will proceed stereotactic brain radiation therapy first. #3 I'll send out her tumor for Foundation one test  #4 return to clinic in 3-4 weeks to discuss systemic therapy. #5 I discussed her dexamethasone dose with Dr. [name], and called in a prescription of 4 mg twice a day   All questions were answered. The patient knows to call the clinic with any problems, questions or concerns.  I spent 40 minutes counseling the patient face to face. The total time spent in the appointment was 60 minutes and more than 50% was on counseling.     Truitt Merle, MD 04/30/2014 8:34 AM

## 2014-04-30 NOTE — Telephone Encounter (Signed)
LM to confirm appt d/t for 05/23/14.Mailed cal.

## 2014-04-30 NOTE — Progress Notes (Signed)
Checked in new pt with no financial concerns at this time.  Pt has Raquel's card for any questions or concerns.

## 2014-05-01 ENCOUNTER — Encounter: Payer: Self-pay | Admitting: Radiation Oncology

## 2014-05-01 ENCOUNTER — Ambulatory Visit
Admission: RE | Admit: 2014-05-01 | Discharge: 2014-05-01 | Disposition: A | Discharge status: Home / Self Care | Payer: Medicare Other | Source: Ambulatory Visit | Attending: Radiation Oncology | Admitting: Radiation Oncology

## 2014-05-01 DIAGNOSIS — C7949 Secondary malignant neoplasm of other parts of nervous system: Principal | ICD-10-CM

## 2014-05-01 DIAGNOSIS — C7931 Secondary malignant neoplasm of brain: Secondary | ICD-10-CM

## 2014-05-01 MED ORDER — GADOBENATE DIMEGLUMINE 529 MG/ML IV SOLN
20.0000 mL | Freq: Once | INTRAVENOUS | Status: AC | PRN
  Administered 2014-05-01: 20 mL via INTRAVENOUS

## 2014-05-01 NOTE — Progress Notes (Signed)
Location/Histology of Brain Tumor: Brain lesions at the right parietal and left frontal lobe  Patient presented with symptoms of:  Numbness involving left side of her face and left upper extremity for 4-6 weeks and recurrent episode of involuntary twitching of her left upper extremity pluspossible seizure  Past or anticipated interventions, if any, per neurosurgery: no  Past or anticipated interventions, if any, per medical oncology: yes, consulted by Dr. Antonieta Loveless on 04/30/2014  Dose of Decadron, if applicable: decadron 4 mg qid  Recent neurologic symptoms, if any:   Seizures: yes, possibly  Headaches: no  Nausea: no  Dizziness/ataxia: no  Difficulty with hand coordination: no  Focal numbness/weakness: yes  Visual deficits/changes: no  Confusion/Memory deficits: no  Painful bone metastases at present, if any: no  SAFETY ISSUES:  Prior radiation? no  Pacemaker/ICD? no  Possible current pregnancy? no  Is the patient on methotrexate? no  Additional Complaints / other details: 73 year old african [place] female. Single. Here to discuss SRS treatment of two brain lesions. Additionally, CT of the chest revealed large left lower lobe mass, hilar and AP window adenopathy.

## 2014-05-02 ENCOUNTER — Ambulatory Visit
Admission: RE | Admit: 2014-05-02 | Discharge: 2014-05-02 | Disposition: A | Discharge status: Home / Self Care | Payer: Medicare Other | Source: Ambulatory Visit | Attending: Radiation Oncology | Admitting: Radiation Oncology

## 2014-05-02 ENCOUNTER — Encounter: Payer: Self-pay | Admitting: Radiation Oncology

## 2014-05-02 VITALS — BP 128/75 | HR 71 | Temp 98.1°F | Resp 12 | Wt 198.7 lb

## 2014-05-02 DIAGNOSIS — C7931 Secondary malignant neoplasm of brain: Secondary | ICD-10-CM | POA: Diagnosis not present

## 2014-05-02 DIAGNOSIS — Z510 Encounter for antineoplastic radiation therapy: Secondary | ICD-10-CM | POA: Diagnosis present

## 2014-05-02 MED ORDER — SODIUM CHLORIDE 0.9 % IJ SOLN
10.0000 mL | INTRAMUSCULAR | Status: DC | PRN
  Administered 2014-05-02: 10 mL via INTRAVENOUS

## 2014-05-02 NOTE — Progress Notes (Addendum)
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 Brain metastasis 198.3 C79.31      Narrative:  The patient returns today for routine follow-up.  I saw her in consultation as an inpatient. At that time, her final diagnosis and staging workup were incomplete. She was due to undergo bronchoscopy on November 27. Ultimately, that procedure failed to yield a cancer diagnosis. Accordingly, she underwent CT-guided lung biopsy in December 3 which showed adenocarcinoma the lung. She has since undergone PET/CT for staging which showed a solitary hepatic metastasis and she underwent high-resolution brain MRI in planning for stereotactic radiosurgery which again revealed to definite brain metastases as well as some other nonspecific findings which will require ongoing follow-up. She presents today to review the results of her staging workup and coordinate treatment of her brain metastases.                              ALLERGIES:  has No Known Allergies.  Meds: Current Outpatient Prescriptions  Medication Sig Dispense Refill  . aspirin 81 MG tablet Take 81 mg by mouth daily.    Marland Kitchen atorvastatin (LIPITOR) 10 MG tablet Take 10 mg by mouth daily.  0  . cholecalciferol (VITAMIN D) 1000 UNITS tablet Take 1,000 Units by mouth daily.    Marland Kitchen dexamethasone (DECADRON) 4 MG tablet Take 1 tablet (4 mg total) by mouth 2 (two) times daily. 30 tablet 0  . hydrochlorothiazide (HYDRODIURIL) 25 MG tablet Take 25 mg by mouth every morning.     . levETIRAcetam (KEPPRA) 1000 MG tablet Take 1 tablet (1,000 mg total) by mouth 2 (two) times daily. 60 tablet 0   No current facility-administered medications for  this encounter.    Physical Findings: The patient is in no acute distress. Patient is alert and oriented.  weight is 198 lb 11.2 oz (90.13 kg). Her oral temperature is 98.1 F (36.7 C). Her blood pressure is 128/75 and her pulse is 71. Her respiration is 12 and oxygen saturation is 98%. .  No significant changes.  Lab Findings: Lab Results  Component Value Date   WBC 19.2* 04/25/2014   HGB 15.2* 04/25/2014   HCT 43.1 04/25/2014   MCV 70.8* 04/25/2014   PLT 342 04/25/2014    @LASTCHEM @  Radiographic Findings: Dg Chest 1 View  04/25/2014   CLINICAL DATA:  Left lower lobe mass  EXAM: CHEST - 1 VIEW  COMPARISON:  04/25/2014  FINDINGS: Left lower lobe mass with adjacent atelectasis noted behind the heart. Negative for effusion or pneumothorax. Stable hyperinflation and cardiomegaly. Trachea is midline. Atherosclerosis of the aorta.  IMPRESSION: Negative for pneumothorax or effusion following left lower lobe retrocardiac mass biopsy.   Electronically Signed   By: Daryll Brod M.D.   On: 04/25/2014 14:03   Dg Chest 2 View  04/18/2014   CLINICAL DATA:  Difficulty breathing  EXAM: CHEST  2 VIEW  COMPARISON:  April 10, 2014  FINDINGS: There is persistent left lower lobe consolidation as well as left base atelectasis. Right lung is clear. The heart size is normal. There is fullness in the  hilar regions bilaterally, somewhat more on the left than on the right. There is atherosclerotic change in aorta. No bone lesions.  IMPRESSION: Persistent left lower lobe consolidation. Findings concerning for potential adenopathy. Given the persistence of the opacity in the left lower lobe and concern for potential adenopathy, contrast enhanced chest CT advised to assess for adenopathy as well as a possible endobronchial lesion on the left. Overall, there is no new opacity compared to prior study.   Electronically Signed   By: Lowella Grip M.D.   On: 04/18/2014 21:22   Ct Head Wo Contrast  04/18/2014    CLINICAL DATA:  Left sided tremors for past 5 hr.  EXAM: CT HEAD WITHOUT CONTRAST  TECHNIQUE: Contiguous axial images were obtained from the base of the skull through the vertex without intravenous contrast.  COMPARISON:  None.  FINDINGS: The ventricles are normal in size and configuration. 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 No other hemorrhage is seen. There is no mass, extra-axial fluid, or midline shift. There is a questionable second recent and possibly acute infarct in the posterior right Crohn radiata. There is small vessel disease in the periventricular white matter more inferiorly, particularly involving both internal capsules in the anterior limb of the right external capsule. Bony calvarium appears intact. The mastoid air cells are clear.  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 Question second acute infarct in the posterior right corona radiata. No hemorrhage is seen in this area. There is patchy small vessel disease in the basal ganglia regions and inferior periventricular white matter bilaterally.  Critical Value/emergent results were called by telephone at the time of interpretation on 04/18/2014 at 9:32 pm to Dr. Merrily Pew , who verbally acknowledged these results.   Electronically Signed   By: Lowella Grip M.D.   On: 04/18/2014 21:33   Ct Chest W Contrast  04/19/2014   CLINICAL DATA:  Patient with new brain lesion, suspected metastatic disease. Evaluate for primary malignancy. Patient initially presented for partial seizures.  EXAM: CT CHEST, ABDOMEN, AND PELVIS WITH CONTRAST  TECHNIQUE: Multidetector CT imaging of the chest, abdomen and pelvis was performed following the standard protocol during bolus administration of intravenous contrast.  CONTRAST:  161mL OMNIPAQUE  IOHEXOL 300 MG/ML  SOLN  COMPARISON:  None.  FINDINGS: CT CHEST FINDINGS  Visualized thyroid is unremarkable. No enlarged axillary lymphadenopathy. There is a 1.5 cm AP window lymph node (image 26; series 2). Left hilar lymphadenopathy measuring up to 1.6 cm (image 34; series 2).  There is a 6.0 x 5.3 x 4.7 cm left lower lobe mass which has broad-based abutment to the left aspect of the mediastinum. This mass causes occlusion of the distal aspect of the left lower lobe bronchi. There is patchy ground-glass opacity within the peripheral aspect of the left lower lobe. There is an additional left lower lobe nodule measuring 1.1 cm (image 44; series 2).  Additional scattered bilateral pulmonary nodules are detailed as follows including in 8 mm right upper lobe nodule (image 27; series 3); a 5 mm right middle lobe nodule (image 49; series 3) ; a 1.1 cm ground-glass nodule within the right lower lobe (image 49; series 3) ; 4 mm left upper lobe pulmonary nodule (image 22; series 3); and a 5 mm right lower lobe nodule (image 41; series 3). Two adjacent  subpleural opacities representing either focal nodules or atelectasis within the left lower lobe (image 27; series 2) and (image 32; series 2). Scattered centrilobular emphysematous change. Left lower hemi thorax pleural thickening versus fluid.  CT ABDOMEN AND PELVIS FINDINGS  Hepatobiliary: There is a 1 cm hyperenhancing lesion within the hepatic dome (image 54; series 2 and image 37; series 5). Additionally there is suggestion of a 1.5 cm lesion within the right hepatic lobe (image 70; series 2). Suggestion of an additional hyperdense lesion within the hepatic dome measuring 1.3 cm (image 60; series 2). Multiple gallstones. No gallbladder wall thickening.  Pancreas: Unremarkable  Spleen: Unremarkable  Adrenals/Urinary Tract: 6 cm cyst off the interpolar region of the left kidney. Focal parenchymal atrophy versus small subcapsular angiomyolipoma measuring 10 mm in the  interpolar region of the right kidney. 2 mm nonobstructing stone within the interpolar region of the right kidney (image 48; series 5). No ureterolithiasis. No hydronephrosis.  Stomach/Bowel: No abnormal bowel wall thickening or evidence for bowel obstruction. The appendix is normal. No free fluid or free intraperitoneal air.  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 Scattered calcified atherosclerotic plaque involving the abdominal aorta. Focal dilatation of the right common iliac artery measuring 2 cm.  Reproductive: Status post hysterectomy.  Other: There is a 2 cm subcutaneous mass within the soft tissues overlying the inferior aspect of the sternum (image 36; series 2). Fat containing periumbilical hernia.  Musculoskeletal: Small patchy lucencies within the left and right ilium measuring up to 1.1 cm within the left ilium (image 95; series 2). Additionally there is a 10 mm patchy lucent lesion within T2 vertebral body (image 9; series 2).  IMPRESSION: 1. Left lower lobe mass concerning for primary lung malignancy. There is an additional adjacent satellite nodule within the left lower lobe as well as peripheral ground-glass and consolidative opacities. The opacities may represent postobstructive pneumonitis or additional sites of disease. Small left-sided pleural effusion, malignant effusion is not excluded. 2. Left hilar and AP window adenopathy concerning for metastatic disease. 3. Multiple additional bilateral pulmonary nodules as detailed above. In the setting of primary lung malignancy, metastatic disease is of concern. 4. Multiple hyperdense lesions within the liver as detailed above which may potentially represent hemangiomas or perfusional anomalies however given primary pulmonary malignancy, metastatic disease is not excluded. Attention on followup or dedicated evaluation with MRI in the nonacute setting is recommended. 5. Lucencies within the right and left ilium as  well as the T2 vertebral body, nonspecific, however raising the possibility of osseous metastatic disease. 6. Cholelithiasis 7. Nonobstructing stone interpolar region right kidney. 8. Infrarenal abdominal aortic ectasia and focal dilatation of the right common iliac artery. 9. There is a 2 cm subcutaneous mass within the soft tissues overlying the inferior aspect of the sternum (image 36; series 2). This is favored to represent a sebaceous cyst however clinical correlation is recommended. These results will be called to the ordering clinician or representative by the Radiologist Assistant, and communication documented in the PACS or zVision Dashboard.   Electronically Signed   By: Lovey Newcomer M.D.   On: 04/19/2014 09:13   Mr Jeri Cos WU Contrast  05/01/2014   CLINICAL DATA:  Metastatic lung cancer. Stereotactic radiosurgery targeting examination.  EXAM: MRI HEAD WITHOUT AND WITH CONTRAST  TECHNIQUE: Multiplanar, multiecho pulse sequences of the brain and surrounding structures were obtained without and with intravenous contrast.  CONTRAST:  12mL MULTIHANCE GADOBENATE DIMEGLUMINE 529 MG/ML IV SOLN  COMPARISON:  MRI  head 04/19/2014  FINDINGS: SRS protocol at 3 Tesla.  High right parietal enhancing lesion measures 10 x 12 mm and is slightly smaller. There is less edema. There is mild hemorrhage in this lesion.  Left parietal white matter lesion measures 12 x 13 mm and is slightly smaller with less edema. This lesion also shows evidence of hemorrhage.  Small areas of hemorrhage without enhancement in the right occipital lobe best seen on gradient echo imaging. These were marked with an arrow. These are unchanged and likely are areas of hemorrhagic metastatic disease.  2 mm enhancing lesion in the right superior cerebellum consistent with metastatic disease. This is difficult to see on the prior study however is probably unchanged.  1 mm punctate enhancement in the central pons best seen on axial and sagittal images.  This is not seen previously and could represent metastatic disease versus a small vessel.  Ventricle size is normal. No shift of the midline structures. Negative for acute infarct. Chronic microvascular ischemic changes in the white matter.  IMPRESSION: Hemorrhagic lesions in the parietal lobes bilaterally are slightly smaller and have less edema related to interval treatment.  Small areas of hemorrhage in the right occipital lobe are suspicious for nonenhancing tumor and are unchanged  2 mm enhancing lesion right superior cerebellum consistent with metastatic disease and probably unchanged. Questionable 1 mm enhancing lesion in the central pons.   Electronically Signed   By: Franchot Gallo M.D.   On: 05/01/2014 16:28   Mr Jeri Cos BJ Contrast  04/19/2014   CLINICAL DATA:  Numbness left-sided face and left upper extremity. Involuntary movements in the left upper extremity. Abnormal CT scan.  EXAM: MRI HEAD WITHOUT AND WITH CONTRAST  TECHNIQUE: Multiplanar, multiecho pulse sequences of the brain and surrounding structures were obtained without and with intravenous contrast.  CONTRAST:  29mL MULTIHANCE GADOBENATE DIMEGLUMINE 529 MG/ML IV SOLN  COMPARISON:  CT head without contrast 04/18/2014.  FINDINGS: Enhancing mass lesion is present within the high right parietal lobe, likely involving the cortex. The lesion measures 19 x 14 x 17 mm. There is significant surrounding vasogenic edema. There is some hemorrhage associated with this lesion is well. Diffusion signal is likely related to susceptibility in the hemorrhage.  A lesion in the posterior left frontal lobe white matter measures 15 x 16 x 13 mm with significant surrounding vasogenic edema. There is no hemorrhage associated with this lesion.  Moderate generalized atrophy and periventricular white matter changes are evident bilaterally. No significant midline shift is present. The ventricles are of normal size. No significant extraaxial fluid collection is  present.  Flow is present in the major intracranial arteries. The globes and orbits are intact. The paranasal sinuses and mastoid air cells are clear.  IMPRESSION: 1. Hemorrhagic mass lesion in the high right parietal lobe measures 19 x 14 x 17 mm. 2. Enhancing mass lesion in the posterior left frontal lobe white matter measures 15 x 16 x 13 mm. 3. There is significant surrounding vasogenic edema with both lesions. These are most compatible with metastases. 4. Moderate generalized atrophy and diffuse white matter disease. This likely reflects the sequelae of chronic microvascular ischemia.   Electronically Signed   By: Lawrence Santiago M.D.   On: 04/19/2014 10:01   Ct Abdomen Pelvis W Contrast  04/19/2014   CLINICAL DATA:  Patient with new brain lesion, suspected metastatic disease. Evaluate for primary malignancy. Patient initially presented for partial seizures.  EXAM: CT CHEST, ABDOMEN, AND PELVIS WITH CONTRAST  TECHNIQUE: Multidetector CT imaging of the chest, abdomen and pelvis was performed following the standard protocol during bolus administration of intravenous contrast.  CONTRAST:  151mL OMNIPAQUE IOHEXOL 300 MG/ML  SOLN  COMPARISON:  None.  FINDINGS: CT CHEST FINDINGS  Visualized thyroid is unremarkable. No enlarged axillary lymphadenopathy. There is a 1.5 cm AP window lymph node (image 26; series 2). Left hilar lymphadenopathy measuring up to 1.6 cm (image 34; series 2).  There is a 6.0 x 5.3 x 4.7 cm left lower lobe mass which has broad-based abutment to the left aspect of the mediastinum. This mass causes occlusion of the distal aspect of the left lower lobe bronchi. There is patchy ground-glass opacity within the peripheral aspect of the left lower lobe. There is an additional left lower lobe nodule measuring 1.1 cm (image 44; series 2).  Additional scattered bilateral pulmonary nodules are detailed as follows including in 8 mm right upper lobe nodule (image 27; series 3); a 5 mm right middle lobe  nodule (image 49; series 3) ; a 1.1 cm ground-glass nodule within the right lower lobe (image 49; series 3) ; 4 mm left upper lobe pulmonary nodule (image 22; series 3); and a 5 mm right lower lobe nodule (image 41; series 3). Two adjacent subpleural opacities representing either focal nodules or atelectasis within the left lower lobe (image 27; series 2) and (image 32; series 2). Scattered centrilobular emphysematous change. Left lower hemi thorax pleural thickening versus fluid.  CT ABDOMEN AND PELVIS FINDINGS  Hepatobiliary: There is a 1 cm hyperenhancing lesion within the hepatic dome (image 54; series 2 and image 37; series 5). Additionally there is suggestion of a 1.5 cm lesion within the right hepatic lobe (image 70; series 2). Suggestion of an additional hyperdense lesion within the hepatic dome measuring 1.3 cm (image 60; series 2). Multiple gallstones. No gallbladder wall thickening.  Pancreas: Unremarkable  Spleen: Unremarkable  Adrenals/Urinary Tract: 6 cm cyst off the interpolar region of the left kidney. 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 2 mm nonobstructing stone within the interpolar region of the right kidney (image 48; series 5). No ureterolithiasis. No hydronephrosis.  Stomach/Bowel: No abnormal bowel wall thickening or evidence for bowel obstruction. The appendix is normal. No free fluid or free intraperitoneal air.  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 Scattered calcified atherosclerotic plaque involving the abdominal aorta. Focal dilatation of the right common iliac artery measuring 2 cm.  Reproductive: Status post hysterectomy.  Other: There is a 2 cm subcutaneous mass within the soft tissues overlying the inferior aspect of the sternum (image 36; series 2). Fat containing periumbilical hernia.  Musculoskeletal: Small patchy lucencies within the left and right ilium  measuring up to 1.1 cm within the left ilium (image 95; series 2). Additionally there is a 10 mm patchy lucent lesion within T2 vertebral body (image 9; series 2).  IMPRESSION: 1. Left lower lobe mass concerning for primary lung malignancy. There is an additional adjacent satellite nodule within the left lower lobe as well as peripheral ground-glass and consolidative opacities. The opacities may represent postobstructive pneumonitis or additional sites of disease. Small left-sided pleural effusion, malignant effusion is not excluded. 2. Left hilar and AP window adenopathy concerning for metastatic disease. 3. Multiple additional bilateral pulmonary nodules as detailed above. In the setting of primary lung malignancy, metastatic disease is of concern. 4. Multiple hyperdense lesions within the liver as detailed above which  may potentially represent hemangiomas or perfusional anomalies however given primary pulmonary malignancy, metastatic disease is not excluded. Attention on followup or dedicated evaluation with MRI in the nonacute setting is recommended. 5. Lucencies within the right and left ilium as well as the T2 vertebral body, nonspecific, however raising the possibility of osseous metastatic disease. 6. Cholelithiasis 7. Nonobstructing stone interpolar region right kidney. 8. Infrarenal abdominal aortic ectasia and focal dilatation of the right common iliac artery. 9. There is a 2 cm subcutaneous mass within the soft tissues overlying the inferior aspect of the sternum (image 36; series 2). This is favored to represent a sebaceous cyst however clinical correlation is recommended. These results will be called to the ordering clinician or representative by the Radiologist Assistant, and communication documented in the PACS or zVision Dashboard.   Electronically Signed   By: Lovey Newcomer M.D.   On: 04/19/2014 09:13   Nm Pet Image Initial (pi) Skull Base To Thigh  04/30/2014   CLINICAL DATA:  Initial treatment  strategy for lung cancer.  EXAM: NUCLEAR MEDICINE PET SKULL BASE TO THIGH  TECHNIQUE: 11.0 mCi F-18 FDG was injected intravenously. Full-ring PET imaging was performed from the skull base to thigh after the radiotracer. CT data was obtained and used for attenuation correction and anatomic localization.  FASTING BLOOD GLUCOSE:  Value: 82 mg/dl  COMPARISON:  Chest CT 04/19/2014.  FINDINGS: NECK  No hypermetabolic lymph nodes in the neck.  CHEST  Again noted is a large pleural based mass in the medial aspect of the left lower lobe which measures up to 8.0 x 4.7 cm on today's examination and is diffusely hypermetabolic (SUVmax = 35.3). There are multiple areas of pleural nodularity and hypermetabolism throughout the left hemithorax, particularly in the left costophrenic sulcus, compatible with widespread pleural metastatic disease. Enlarged left hilar lymph nodes estimated to measure approximately 2.6 x 1.8 cm are hypermetabolic (SUVmax = 61.4). Enlarged 12 mm short axis AP window lymph node is hypermetabolic (SUVmax = 43.1). There is also a small focus of hypermetabolism in the superior mediastinum immediately lateral to the proximal left subclavian artery (SUVmax = he 5.1), which may represent a nonenlarged but hypermetabolic metastatic lymph node. There is some mild postobstructive changes in the lung parenchyma in the left lower lobe adjacent to the large mass, most compatible with postobstructive pneumonitis. Satellite nodule in in the left lower lobe measuring 9 mm (image 55 of series 6) is hypermetabolic (SUVmax = 5.0). 4 mm subpleural nodule in the medial aspect of the left upper lobe (image 32 of series 6) is unchanged and nonspecific. A few other scattered small pulmonary nodules are also noted and unchanged compared to the prior examination. There is a background of diffuse bronchial wall thickening with moderate centrilobular emphysema. No pleural effusions.  ABDOMEN/PELVIS  In the central aspect of the left  lobe of the liver and there is a focus of hypermetabolism (SUVmax = 14.5), with no definite CT correlate on today's non contrast CT examination. However, in retrospect, on prior study 04/19/2014, there may be a very subtle hypovascular lesion in the central aspect of segment 3 (image 60 of series 2 of that study), which likely accounts for the finding on today's CT examination. No abnormal hypermetabolic activity within the pancreas, adrenal glands, or spleen. No hypermetabolic lymph nodes in the abdomen or pelvis. Multiple partially calcified gallstones layer dependently in the gallbladder. No current findings to suggest acute cholecystitis at this time. 5.8 cm low-attenuation exophytic lesion in the  lateral aspect of the interpolar region of the left kidney demonstrates no hypermetabolism and was previously characterized as a simple cyst. Atherosclerosis, including fusiform ectasia (2.6 cm in diameter) of the infrarenal abdominal aorta, and mild aneurysmal dilatation of the right common iliac artery which measures up to 2.1 cm in diameter.  SKELETON  Previously noted subcutaneous lesion anterior to the sternum demonstrates no hypermetabolism, likely a small sebaceous cyst. No focal hypermetabolic activity to suggest skeletal metastasis. Specifically, no activity within T2 vertebral body, or the lesions in the right are left ilium is identified to suggest metastatic disease to the bones on today's examination.  IMPRESSION: 1. 8.0 x 4.7 cm pleural-based left lower lobe mass with satellite nodule in the left lower lobe, left hilar and mediastinal adenopathy (extending into the superior mediastinum), widespread disease in the left pleural space, and solitary hepatic metastasis. Findings are compatible with stage IV lung cancer. 2. Multiple other smaller pulmonary nodules are nonspecific, but unchanged compared to the prior examination. Metastatic disease to the lungs is not excluded, and continued attention on future  followup studies is recommended. 3. 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 4. Additional incidental findings, as above.   Electronically Signed   By: Vinnie Langton M.D.   On: 04/30/2014 17:49   Ct Biopsy  04/25/2014   CLINICAL DATA:  73 year old with left lung mass and brain lesions. Tissue diagnosis is needed.  EXAM: CT-GUIDED LEFT LUNG MASS BIOPSY  Physician: Stephan Minister. Henn, MD  MEDICATIONS: 1.5 mg Versed, 50 mcg fentanyl. A radiology nurse monitored the patient for moderate sedation.  ANESTHESIA/SEDATION: Sedation time: 10 min  PROCEDURE: The procedure was explained to the patient. The risks and benefits of the procedure were discussed and the patient's questions were addressed. Specifically, the risk of pneumothorax, bleeding and death were discussed with the patient. Informed consent was obtained from the patient. The patient was placed prone on CT scan. Images through the lower chest were obtained. The mass in the left lower lobe was identified. The left side of the back was prepped and draped in sterile fashion. 1% lidocaine was used for local anesthetic. 22 gauge needle was directed into the posterior aspect of the mass. Needle position was confirmed within the lesion. Two core biopsies were obtained with an 18 gauge device. Specimens were placed in formalin. 17 gauge needle was removed without complication. Bandage placed at the puncture site.  FINDINGS: There is central left lower lobe mass measuring up to 5 cm. Satellite nodule adjacent to the lesion measures 0.8 cm. Needle position confirmed within the lesion. No evidence for pneumothorax following core biopsies.  COMPLICATIONS: None  IMPRESSION: CT-guided core biopsies of the left lower lobe mass.   Electronically Signed   By: Markus Daft M.D.   On: 04/25/2014 17:20   Dg Chest Port 1  View  04/19/2014   CLINICAL DATA:  Post bronchoscopy with biopsy.  EXAM: PORTABLE CHEST - 1 VIEW  COMPARISON:  04/10/2014 as well as CT 04/19/2014  FINDINGS: Lungs are adequately inflated with mild opacification over the left base likely atelectasis. There is evidence of patient's medial left base/ infrahilar mass in the retrocardiac region. Evidence of known left hilar adenopathy. No evidence of pneumothorax. Cardiomediastinal silhouette and remainder of the exam is unchanged.  IMPRESSION: Known medial left basilar lung mass and left hilar adenopathy. No evidence pneumothorax post bronchoscopy/  biopsy. Linear density left base likely atelectasis.   Electronically Signed   By: Marin Olp M.D.   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 At this point, the patient would potentially benefit from radiotherapy. The options include whole brain irradiation versus stereotactic radiosurgery. There are pros and cons associated with each of these potential treatment options. Whole brain radiotherapy would treat the known metastatic deposits and help provide some reduction of risk for future brain metastases. However, whole brain radiotherapy carries potential risks including hair loss, subacute somnolence, and neurocognitive changes including a possible reduction in short-term memory. Whole brain radiotherapy also may carry a lower likelihood of tumor control at the treatment sites because of the low-dose used. Stereotactic radiosurgery carries a higher likelihood for local tumor control at the targeted sites with lower associated risk for neurocognitive changes such as memory loss. However, the use of stereotactic radiosurgery in this setting may leave the patient at increased risk for new brain metastases elsewhere in the brain as high as 50-60%. Accordingly, patients who receive stereotactic radiosurgery in this setting should undergo ongoing  surveillance imaging with brain MRI more frequently in order to identify and treat new small brain metastases before they become symptomatic. Stereotactic radiosurgery does carry some different risks, including a risk of radionecrosis.  PLAN: Today, I reviewed the findings and workup thus far with the patient. We discussed the dilemma regarding whole brain radiotherapy versus stereotactic radiosurgery. We discussed the pros and cons of each. We also discussed the logistics and delivery of each. We reviewed the results associated with each of the treatments described above. The patient seems to understand the treatment options and would like to proceed with stereotactic radiosurgery.  At this point, I would recommend stereotactic radiosurgery for her 13 mm left parietal metastasis, her 12 mm right parietal metastasis, and her 2 mm right cerebellar metastasis. The 1 mm abnormality noted within the central pons is not felt to clearly represent a metastatic deposit versus small vessel. Accordingly, given the risky location for radiosurgery, I would favor a short interval follow-up MRI in 6 weeks to evaluate the pontine finding.  I spent 30 minutes minutes face to face with the patient and more than 50% of that time was spent in counseling and/or coordination of care.   _____________________________________  [name]. [name], M.D.

## 2014-05-02 NOTE — Addendum Note (Signed)
Encounter addended by: Jenene Slicker, RN on: 05/02/2014 11:36 AM<BR>     Documentation filed: Inpatient Document Flowsheet

## 2014-05-02 NOTE — Progress Notes (Signed)
  Radiation Oncology         [phone] ________________________________  Name: Michelle Cobb MRN: [id number]  Date: 05/02/2014  DOB: 12-25-1940  SIMULATION AND TREATMENT PLANNING NOTE    ICD-9-CM ICD-10-CM   1. 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  Identity was confirmed.  All relevant records and images related to the planned course of therapy were reviewed.  The patient freely provided informed written consent to proceed with treatment after reviewing the details related to the planned course of therapy. The consent form was witnessed and verified by the simulation staff. Intravenous access was established for contrast administration. Then, the patient was set-up in a stable reproducible supine position for radiation therapy.  A relocatable thermoplastic stereotactic head frame was fabricated for precise immobilization.  CT images were obtained.  Surface markings were placed.  The CT images were loaded into the planning software and fused with the patient's targeting MRI scan.  Then the target and avoidance structures were contoured.  Treatment planning then occurred.  The radiation prescription was entered and confirmed.  I have requested 3D planning  I have requested a DVH of the following structures: Brain stem, brain, left eye, right eye, lenses, optic chiasm, target volumes, uninvolved brain, and normal tissue.    PLAN:  The patient will receive 20 Gy in 1 fraction to her bilateral parietal metastases and right cerebellar metastasis..  ________________________________  [name] [name], M.D.

## 2014-05-02 NOTE — Addendum Note (Signed)
Encounter addended by: Jenene Slicker, RN on: 05/02/2014 11:31 AM<BR>     Documentation filed: Dx Association, Orders

## 2014-05-02 NOTE — Addendum Note (Signed)
Encounter addended by: Jenene Slicker, RN on: 05/02/2014 11:40 AM<BR>     Documentation filed: Inpatient Document Flowsheet, Lines/Drains/Airways Properties Editor

## 2014-05-02 NOTE — Addendum Note (Signed)
Encounter addended by: Lora Paula, MD on: 05/02/2014 11:18 AM<BR>     Documentation filed: Notes Section

## 2014-05-02 NOTE — Addendum Note (Signed)
Encounter addended by: Jenene Slicker, RN on: 05/02/2014 11:41 AM<BR>     Documentation filed: Inpatient MAR

## 2014-05-02 NOTE — Progress Notes (Signed)
Location/Histology of Brain Tumor: Brain lesions at the right parietal and left frontal lobe  Patient presented with symptoms of:  Numbness involving left side of her face and left upper extremity for 4-6 weeks and recurrent episode of involuntary twitching of her left upper extremity pluspossible seizure  Past or anticipated interventions, if any, per neurosurgery: no  Past or anticipated interventions, if any, per medical oncology: yes, consulted by Dr. Antonieta Loveless on 04/30/2014  Dose of Decadron, if applicable: decadron 4 mg bid  Recent neurologic symptoms, if any:   Seizures: yes, possibly  Headaches: no  Nausea: no  Dizziness/ataxia: no  Difficulty with hand coordination: no  Focal numbness/weakness: yes- left side of neck down to left elbow and left rib cage.  Visual deficits/changes: no  Confusion/Memory deficits: no  Painful bone metastases at present, if any: no  SAFETY ISSUES:  Prior radiation? no  Pacemaker/ICD? no  Possible current pregnancy? no  Is the patient on methotrexate? no  Additional Complaints / other details: 73 year old african [place] female. Single. Here to discuss SRS treatment of two brain lesions. Additionally, CT of the chest revealed large left lower lobe mass, hilar and AP window adenopathy. PERRLA, upper and lower extremities equally strong.  Oral exam reveal, dry pink tongue.

## 2014-05-02 NOTE — Progress Notes (Signed)
Removed IV intact from left AC.  Bleeding noted.  Pressure applied for 1 minute.  Bleeding stopped.  Applied clean gauze and band aid.

## 2014-05-03 DIAGNOSIS — Z510 Encounter for antineoplastic radiation therapy: Secondary | ICD-10-CM | POA: Diagnosis not present

## 2014-05-05 ENCOUNTER — Encounter: Payer: Self-pay | Admitting: Hematology

## 2014-05-07 DIAGNOSIS — Z510 Encounter for antineoplastic radiation therapy: Secondary | ICD-10-CM | POA: Diagnosis not present

## 2014-05-09 ENCOUNTER — Encounter: Payer: Self-pay | Admitting: Radiation Oncology

## 2014-05-09 ENCOUNTER — Ambulatory Visit
Admission: RE | Admit: 2014-05-09 | Discharge: 2014-05-09 | Disposition: A | Discharge status: Home / Self Care | Payer: Medicare Other | Source: Ambulatory Visit | Attending: Radiation Oncology | Admitting: Radiation Oncology

## 2014-05-09 VITALS — BP 120/64 | HR 54 | Temp 99.0°F | Resp 12

## 2014-05-09 DIAGNOSIS — Z510 Encounter for antineoplastic radiation therapy: Secondary | ICD-10-CM | POA: Diagnosis not present

## 2014-05-09 DIAGNOSIS — C7931 Secondary malignant neoplasm of brain: Secondary | ICD-10-CM

## 2014-05-09 MED ORDER — DEXAMETHASONE 4 MG PO TABS: 4.0000 mg | ORAL_TABLET | Freq: Two times a day (BID) | ORAL | Status: DC

## 2014-05-09 NOTE — Progress Notes (Signed)
  Radiation Oncology         [phone] ________________________________  Stereotactic Treatment Procedure Note  Name: Michelle Cobb MRN: [id number]  Date: 05/09/2014  DOB: Apr 18, 1941     SPECIAL TREATMENT PROCEDURE    ICD-9-CM ICD-10-CM   1. Brain metastasis 198.3 C79.31 dexamethasone (DECADRON) 4 MG tablet    3D TREATMENT PLANNING AND DOSIMETRY:  The patient's radiation plan was reviewed and approved by neurosurgery and radiation oncology prior to treatment.  It showed 3-dimensional radiation distributions overlaid onto the planning CT/MRI image set.  The [facility] for the target structures as well as the organs at risk were reviewed. The documentation of the 3D plan and dosimetry are filed in the radiation oncology EMR.  NARRATIVE:  Michelle Cobb was brought to the TrueBeam stereotactic radiation treatment machine and placed supine on the CT couch. The head frame was applied, and the patient was set up for stereotactic radiosurgery.  Neurosurgery was present for the set-up and delivery  SIMULATION VERIFICATION:  In the couch zero-angle position, the patient underwent Exactrac imaging using the Brainlab system with orthogonal KV images.  These were carefully aligned and repeated to confirm treatment position for each of the isocenters.  The Exactrac snap film verification was repeated at each couch angle.  SPECIAL TREATMENT PROCEDURE: Michelle Cobb received stereotactic radiosurgery to the following targets: Left parietal 13 mm target was treated using 3 Dynamic Conformal Arcs to a prescription dose of 20 Gy. ExacTrac registration was performed for each couch angle. The 81.3% isodose line was prescribed. Right parietal 12 mm target was treated using 3 Dynamic Conformal Arcs to a prescription dose of 20 Gy. ExacTrac registration was performed for each couch angle. The 81.3% isodose line was prescribed. Right cerebellar 2 mm target was treated using 2 Circular Arcs to a  prescription dose of 20 Gy. ExacTrac registration was performed for each couch angle. The 72.5% isodose line was prescribed. Pontine (brainstem) 2 mm target was treated using 2 Circular Arcs to a prescription dose of 18 Gy. ExacTrac registration was performed for each couch angle. The 89.3% isodose line was prescribed. This is a complex treatment, being within the brainstem. Right occipital 2 mm target was treated using 3 Circular Arcs to a prescription dose of 20 Gy. ExacTrac registration was performed for each couch angle. The 85.5% isodose line was prescribed  STEREOTACTIC TREATMENT MANAGEMENT:  Following delivery, the patient was transported to nursing in stable condition and monitored for possible acute effects.  Vital signs were recorded BP 124/107 mmHg  Pulse 58  Temp(Src) 99 F (37.2 C) (Oral)  Resp 12  SpO2 98%. The patient tolerated treatment without significant acute effects, and was discharged to home in stable condition.    PLAN: Follow-up in one month.  ________________________________  [name]. [name], M.D.

## 2014-05-09 NOTE — Progress Notes (Signed)
  Radiation Oncology         [phone] ________________________________  Name: JAQUELINE UBER MRN: [id number]  Date: 05/09/2014  DOB: 1941/02/15  End of Treatment Note    ICD-9-CM ICD-10-CM   1. 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 ExacTrac registration was performed for each couch angle. The 81.3% isodose line was prescribed. Right parietal 12 mm target was treated using 3 Dynamic Conformal Arcs to a prescription dose of 20 Gy. ExacTrac registration was performed for each couch angle. The 81.3% isodose line was prescribed. Right cerebellar 2 mm target was treated using 2 Circular Arcs to a prescription dose of 20 Gy. ExacTrac registration was performed for each couch angle. The 72.5% isodose line was prescribed. Pontine (brainstem) 2 mm target was treated using 2 Circular Arcs to a prescription dose of 18 Gy. ExacTrac registration was performed for each couch angle. The 89.3% isodose line was prescribed. This is a complex treatment, being within the brainstem. Right occipital 2 mm target was treated using 3 Circular Arcs to a prescription dose of 20 Gy. ExacTrac registration was performed for each couch angle. The 85.5% isodose line was prescribed  Narrative: The patient tolerated radiation treatment relatively well.   No acute complications occurred.   Plan: The patient has completed radiation treatment. The patient will return to radiation oncology clinic for routine followup in one month. I advised them to call or return sooner if they have any questions or concerns related to their recovery or  treatment. ________________________________  [name]. [name], M.D.

## 2014-05-09 NOTE — Progress Notes (Signed)
Provided patient with decadron taper instructions. Clarified Decadron quantity with Clarise Cruz, pharmacist at [facility], RN provided patient with calendar illustrating decadron taper.

## 2014-05-09 NOTE — Progress Notes (Signed)
Pt here post [facility] treatment.  Pt to be here for 45 minVital signs assessed upon arrival to nursing station and immediately prior to discharge from nursing station.  Pt denies nausea, vomiting, headache, visual or auditory abnormalities at anytime.  Pt is presenting a steady gait.  Pt given a calendar with scheduled Decadron taper instructions.

## 2014-05-09 NOTE — Op Note (Signed)
Stereotactic Radiosurgery Operative Note  Name: Michelle Cobb MRN: [id number]  Date: 05/09/2014  DOB: 01-Feb-1941  Op Note  Pre Operative Diagnosis:  Metastatic lung cancer with multiple brain metastases (including brainstem metastasis in pons)  Post Operative Diagnois:  Metastatic lung cancer with multiple brain metastases (including brainstem metastasis in pons)  3D TREATMENT PLANNING AND DOSIMETRY:  The patient's radiation plan was reviewed and approved by myself (neurosurgery) and Dr. Tyler Pita (radiation oncology) prior to treatment.  It showed 3-dimensional radiation distributions overlaid onto the planning CT/MRI image set.  The [facility] for the target structures as well as the organs at risk were reviewed. The documentation of the 3D plan and dosimetry are filed in the radiation oncology EMR.  NARRATIVE:  Michelle Cobb was brought to the TrueBeam stereotactic radiation treatment machine and placed supine on the CT couch. The head frame was applied, and the patient was set up for stereotactic radiosurgery.  I was present for the set-up and delivery.  SIMULATION VERIFICATION:  In the couch zero-angle position, the patient underwent Exactrac imaging using the Brainlab system with orthogonal KV images.  These were carefully aligned and repeated to confirm treatment position for each of the isocenters.  The Exactrac snap film verification was repeated at each couch angle.  SPECIAL TREATMENT PROCEDURE: Michelle Cobb received stereotactic radiosurgery to the following targets: Left parietal 13 mmtarget was treated using 3 Dynamic Conformal Arcs to a prescription dose of 20 Gy.  ExacTrac registration was performed for each couch angle.  The 81.3% isodose line was prescribed. Right parietal 12 mmtarget was treated using 3 Dynamic Conformal Arcs to a prescription dose of 20 Gy.  ExacTrac registration was performed for each couch angle.  The 81.3% isodose line was prescribed. Right  cerebellar 51mm target was treated using 2 Circular Arcs to a prescription dose of 20 Gy.  ExacTrac registration was performed for each couch angle.  The 72.5% isodose line was prescribed. Pontine (brainstem) 53mm target was treated using 3 Circular Arcs to a prescription dose of 18 Gy. ExacTrac registration was performed for each couch angle.  The 89.3% isodose line was prescribed.  This is a complex treatment, being within the brainstem. Right occipital 31mm target was treated using 3 Circular Arcs to a prescription dose of 20 Gy.  ExacTrac registration was performed for each couch angle.  The 85.5% isodose line was prescribed.   STEREOTACTIC TREATMENT MANAGEMENT:  Following delivery, the patient was transported to nursing in stable condition and monitored for possible acute effects.  Vital signs were recorded BP 120/64 mmHg  Pulse 54  Temp(Src) 99 F (37.2 C) (Oral)  Resp 12  SpO2 97%. The patient tolerated treatment without significant acute effects, and was discharged to home in stable condition.    PLAN: Follow-up in one month.

## 2014-05-14 ENCOUNTER — Encounter (HOSPITAL_COMMUNITY): Payer: Self-pay

## 2014-05-17 LAB — FUNGUS CULTURE W SMEAR: FUNGAL SMEAR: NONE SEEN

## 2014-05-23 ENCOUNTER — Ambulatory Visit (HOSPITAL_BASED_OUTPATIENT_CLINIC_OR_DEPARTMENT_OTHER): Payer: Medicare Other

## 2014-05-23 ENCOUNTER — Encounter: Payer: Self-pay | Admitting: Hematology

## 2014-05-23 ENCOUNTER — Ambulatory Visit (HOSPITAL_BASED_OUTPATIENT_CLINIC_OR_DEPARTMENT_OTHER): Payer: Medicare Other | Admitting: Hematology

## 2014-05-23 VITALS — BP 144/72 | HR 70 | Temp 98.3°F | Resp 19 | Ht 69.0 in | Wt 197.3 lb

## 2014-05-23 DIAGNOSIS — C3432 Malignant neoplasm of lower lobe, left bronchus or lung: Secondary | ICD-10-CM

## 2014-05-23 DIAGNOSIS — C7931 Secondary malignant neoplasm of brain: Secondary | ICD-10-CM

## 2014-05-23 DIAGNOSIS — I10 Essential (primary) hypertension: Secondary | ICD-10-CM

## 2014-05-23 DIAGNOSIS — C787 Secondary malignant neoplasm of liver and intrahepatic bile duct: Secondary | ICD-10-CM

## 2014-05-23 DIAGNOSIS — C3490 Malignant neoplasm of unspecified part of unspecified bronchus or lung: Secondary | ICD-10-CM | POA: Insufficient documentation

## 2014-05-23 LAB — COMPREHENSIVE METABOLIC PANEL (CC13)
ALT: 20 U/L (ref 0–55)
AST: 11 U/L (ref 5–34)
Albumin: 3.5 g/dL (ref 3.5–5.0)
Alkaline Phosphatase: 50 U/L (ref 40–150)
Anion Gap: 9 mEq/L (ref 3–11)
BUN: 17.4 mg/dL (ref 7.0–26.0)
CALCIUM: 9.4 mg/dL (ref 8.4–10.4)
CHLORIDE: 102 meq/L (ref 98–109)
CO2: 31 mEq/L — ABNORMAL HIGH (ref 22–29)
Creatinine: 0.8 mg/dL (ref 0.6–1.1)
EGFR: 86 mL/min/{1.73_m2} — ABNORMAL LOW (ref >90)
Glucose: 87 mg/dl (ref 70–140)
POTASSIUM: 3.9 meq/L (ref 3.5–5.1)
SODIUM: 142 meq/L (ref 136–145)
TOTAL PROTEIN: 6.6 g/dL (ref 6.4–8.3)
Total Bilirubin: 1.02 mg/dL (ref 0.20–1.20)

## 2014-05-23 LAB — CBC WITH DIFFERENTIAL/PLATELET
BASO%: 0.3 % (ref 0.0–2.0)
Basophils Absolute: 0 10*3/uL (ref 0.0–0.1)
EOS%: 0.1 % (ref 0.0–7.0)
Eosinophils Absolute: 0 10*3/uL (ref 0.0–0.5)
HCT: 43.6 % (ref 34.8–46.6)
HGB: 13.9 g/dL (ref 11.6–15.9)
LYMPH#: 1 10*3/uL (ref 0.9–3.3)
LYMPH%: 5.9 % — ABNORMAL LOW (ref 14.0–49.7)
MCH: 24.6 pg — AB (ref 25.1–34.0)
MCHC: 31.9 g/dL (ref 31.5–36.0)
MCV: 77.2 fL — ABNORMAL LOW (ref 79.5–101.0)
MONO#: 0.6 10*3/uL (ref 0.1–0.9)
MONO%: 3.5 % (ref 0.0–14.0)
NEUT#: 15.5 10*3/uL — ABNORMAL HIGH (ref 1.5–6.5)
NEUT%: 90.2 % — ABNORMAL HIGH (ref 38.4–76.8)
Platelets: 210 10*3/uL (ref 145–400)
RBC: 5.65 10*6/uL — ABNORMAL HIGH (ref 3.70–5.45)
RDW: 16.2 % — AB (ref 11.2–14.5)
WBC: 17.2 10*3/uL — ABNORMAL HIGH (ref 3.9–10.3)

## 2014-05-23 MED ORDER — ONDANSETRON HCL 8 MG PO TABS: 8.0000 mg | ORAL_TABLET | Freq: Three times a day (TID) | ORAL | Status: DC | PRN

## 2014-05-23 MED ORDER — FOLIC ACID 1 MG PO TABS: 1.0000 mg | ORAL_TABLET | Freq: Every day | ORAL | Status: DC

## 2014-05-23 NOTE — Progress Notes (Signed)
Michelle Cobb  [phone] Fax:([phone]) [place] Note   Patient Care Team: Christain Sacramento, MD as PCP - General (Family Medicine) 05/23/2014  CHIEF COMPLAINTS Follow up  Oncology History   Lung cancer   Staging form: Lung, AJCC 7th Edition     Clinical: Stage IV (T3, N2, M1b) - Signed by Truitt Merle, MD on 05/23/2014       Lung cancer   04/19/2014 Imaging brain MRI showed a hemorrhagic mass 1.9cm in r oarital loibe and a 1.6cm lesion in left front lobe, with significant surrounding edema.    04/25/2014 Initial Diagnosis LLL lung adenocarcinoma, diagnosed through CT guided biopsy, TTF-1(+), CK5/6 (+), with metastases to b/l lungs, brain and liver. She presented wiht left side numbness and possible seizure.    04/30/2014 Imaging PET scan showed a 8cm hypermetabolic LLL lung mass, mediastinal adenopathy, multiple small lung nodules (non hypermetabolic) and a single hypermetabolic liver lesion in segment 3.    05/09/2014 -  Radiation Therapy SBRT to brain lesion    MOLECULAR TESTING ([facility]) KRAS G12C ATRXK 1169 [id number] RICTOR amplification  TP53 A83P Negative for EGFR, ALK, MET, HER2, ROS-1, BRAF etc.   INTERIM HISTORY She returns for follow up. She received SBRT to brian mets on 12/17. She tolerated it very well. She is still on dexa tapering dose. She does not like the dexa, due to the insomnia, and weight gain.  She feels well overwise. She has mild dry cough, no chest pain, dyspnea or hemoptysis.     MEDICAL HISTORY:  Past Medical History  Diagnosis Date  . Hypertension   . Hyperlipidemia   . Knee injury 1983    left  . Lung cancer   . Brain cancer     SURGICAL HISTORY: Past Surgical History  Procedure Laterality Date  . Video bronchoscopy Bilateral 04/19/2014    Procedure: VIDEO BRONCHOSCOPY WITHOUT FLUORO;  Surgeon: Rigoberto Noel, MD;  Location: [facility];  Service: Cardiopulmonary;  Laterality: Bilateral;  .  Abdominal hysterectomy  1983    SOCIAL HISTORY: History   Social History  . Marital Status: Single    Spouse Name: N/A    Number of Children: N/A  . Years of Education: N/A   Occupational History  . Not on file.   Social History Main Topics  . Smoking status: Former Research scientist (life sciences)  . Smokeless tobacco: Current User  . Alcohol Use: Yes     Comment: 2 oz/week  . Drug Use: No  . Sexual Activity: Not Currently   Other Topics Concern  . Not on file   Social History Narrative    FAMILY HISTORY: Family History  Problem Relation Age of Onset  . Cancer Sister 26    unknown detail     ALLERGIES:  has No Known Allergies.  MEDICATIONS:  Current Outpatient Prescriptions  Medication Sig Dispense Refill  . aspirin 81 MG tablet Take 81 mg by mouth daily.    Marland Kitchen atorvastatin (LIPITOR) 10 MG tablet Take 10 mg by mouth daily.  0  . cholecalciferol (VITAMIN D) 1000 UNITS tablet Take 1,000 Units by mouth daily.    Marland Kitchen dexamethasone (DECADRON) 4 MG tablet Take 1 tablet (4 mg total) by mouth 2 (two) times daily. 30 tablet 0  . hydrochlorothiazide (HYDRODIURIL) 25 MG tablet Take 25 mg by mouth every morning.     . levETIRAcetam (KEPPRA) 1000 MG tablet Take 1 tablet (1,000 mg total) by mouth 2 (two) times daily. [place]  tablet 0  . ondansetron (ZOFRAN) 8 MG tablet Take 1 tablet (8 mg total) by mouth every 8 (eight) hours as needed for nausea or vomiting. 20 tablet 0   No current facility-administered medications for this visit.    REVIEW OF SYSTEMS:   Constitutional: Denies fevers, chills or abnormal night sweats Eyes: Denies blurriness of vision, double vision or watery eyes Ears, nose, mouth, throat, and face: Denies mucositis or sore throat Respiratory: Denies cough, dyspnea or wheezes Cardiovascular: Denies palpitation, chest discomfort or lower extremity swelling Gastrointestinal:  Denies nausea, heartburn or change in bowel habits Skin: Denies abnormal skin rashes Lymphatics: Denies new  lymphadenopathy or easy bruising Neurological: see HPI  Behavioral/Psych: Mood is stable, no new changes  All other systems were reviewed with the patient and are negative.  PHYSICAL EXAMINATION: ECOG PERFORMANCE STATUS: 1 - Symptomatic but completely ambulatory  Filed Vitals:   05/23/14 1043  BP: 144/72  Pulse: 70  Temp: 98.3 F (36.8 C)  Resp: 19   Filed Weights   05/23/14 1043  Weight: 197 lb 4.8 oz (89.495 kg)    GENERAL:alert, no distress and comfortable SKIN: skin color, texture, turgor are normal, no rashes or significant lesions EYES: normal, conjunctiva are pink and non-injected, sclera clear OROPHARYNX:no exudate, no erythema and lips, buccal mucosa, and tongue normal  NECK: supple, thyroid normal size, non-tender, without nodularity LYMPH:  no palpable lymphadenopathy in the cervical, axillary or inguinal LUNGS: clear to auscultation and percussion with normal breathing effort HEART: regular rate & rhythm and no murmurs and no lower extremity edema ABDOMEN:abdomen soft, non-tender and normal bowel sounds Musculoskeletal:no cyanosis of digits and no clubbing  PSYCH: alert & oriented x 3 with fluent speech NEURO: no focal motor/sensory deficits  LABORATORY DATA:  I have reviewed the data as listed CBC Latest Ref Rng 05/23/2014 04/25/2014 04/18/2014  WBC 3.9 - 10.3 10e3/uL 17.2(H) 19.2(H) 9.3  Hemoglobin 11.6 - 15.9 g/dL 13.9 15.2(H) 12.5  Hematocrit 34.8 - 46.6 % 43.6 43.1 36.1  Platelets 145 - 400 10e3/uL 210 342 281    CMP Latest Ref Rng 05/23/2014 04/18/2014  Glucose 70 - 140 mg/dl 87 110(H)  BUN 7.0 - 26.0 mg/dL 17.4 10  Creatinine 0.6 - 1.1 mg/dL 0.8 0.77  Sodium 136 - 145 mEq/L 142 140  Potassium 3.5 - 5.1 mEq/L 3.9 3.2(L)  Chloride 96 - 112 mEq/L - 100  CO2 22 - 29 mEq/L 31(H) 27  Calcium 8.4 - 10.4 mg/dL 9.4 8.5  Total Protein 6.4 - 8.3 g/dL 6.6 7.1  Total Bilirubin 0.20 - 1.20 mg/dL 1.02 0.3  Alkaline Phos 40 - 150 U/L 50 73  AST 5 - 34 U/L 11  14  ALT 0 - 55 U/L 20 11   PATHOLOGY REPORT 04/25/2014 Lung, needle/core biopsy(ies), LLL - NON SMALL CELL CARCINOMA. Microscopic Comment Immunohistochemistry will be performed and reported as an addendum. (JDP:gt, 04/26/14) ADDENDUM: Immunohistochemistry is performed and the tumor is positive with thyroid transcription factor-1 (TTF-1), and shows focal positivity with cytokeratin 5/6. The immunophenotype is consistent with primary lung adenocarcinoma. (JDP:gt, 04/29/14)    RADIOGRAPHIC STUDIES: I have personally reviewed the radiological images as listed and agreed with the findings in the report.  Mr Jeri Cos Wo Contrast 04/19/2014      IMPRESSION:  1. Hemorrhagic mass lesion in the high right parietal lobe measures 19 x 14 x 17 mm.  2. Enhancing mass lesion in the posterior left frontal lobe white matter measures 15 x 16 x 13  mm.  3. There is significant surrounding vasogenic edema with both lesions. These are most compatible with metastases.  4. Moderate generalized atrophy and diffuse white matter disease. This likely reflects the sequelae of chronic microvascular ischemia.     PET 04/30/2014 1. 8.0 x 4.7 cm pleural-based left lower lobe mass with satellite nodule in the left lower lobe, left hilar and mediastinal adenopathy (extending into the superior mediastinum), widespread disease in the left pleural space, and solitary hepatic metastasis. Findings are compatible with stage IV lung cancer. 2. Multiple other smaller pulmonary nodules are nonspecific, but unchanged compared to the prior examination. Metastatic disease to the lungs is not excluded, and continued attention on future followup studies is recommended. 3. 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 4. Additional incidental findings, as above.   ASSESSMENT & PLAN:  A  73 year old African-American female with past medical history of hypertension, dyslipidemia, have a smoking, who presents left-sided numbness and possible seizure episodes. A CT of chest reviewed a large left upper lobe lung mass, hilar and mediastinal adenopathy, and 2 brain lesions with significant surrounding edema. 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 CT-guided lung mass biopsy showed TTF-1 positive non-small cell carcinoma, consistent with primary lung adenocarcinoma.  1. 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 the goal of treatment is palliation to prolong her life and preserve her quality of life. -I discussed the [facility] result with her, unfortunately, there is no actionable mutations, and her tumor is KRAS (+)  -She has good performance status and preserved organ function, I recommend her to start chemo next week, based on her adeno histology, I recommend carboplatin and pemetrexed, every 3 weeks for total of 4-6 cycles. I also discussed the benefit of Avastin, due to her hemorrhagic brain lesion, I would not use at this point. If her brain metastasis response to radiation very well, with minimal residual disease, I may consider adding Avastin in the future. -Chemotherapy side effects, including but not limited to, fatigue, nausea, vomiting, risk of infection, need for blood transfusion, hearing loss, peripheral neuropathy, anorexia, etc. were discussed with patient in details. Consent was obtained during the visit. She agrees to proceed.  2. 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 [name], repeat brain MRI in 3 months.  3. HTN -Continue medication next  4. Social support, -She lives alone, has one son in [place]. She does have some support from her neighbors. I offered her to talk to  our social worker. - Plan -finish dexa tapering over the next 2 weeks per Dr. [name]  -B12 injection and folic acid  -Chemotherapy class next week, schedule her first round of chemotherapy later next week. -I called in Zofran to her pharmacy -Return to my clinic in 2 weeks for toxicity check up.  All questions were answered. The patient knows to call the clinic with any problems, questions or concerns.  I spent 25 minutes counseling the patient face to face. The total time spent in the appointment was 30 minutes and more than 50% was on counseling.     Truitt Merle, MD 05/23/2014 12:18 PM

## 2014-05-27 ENCOUNTER — Other Ambulatory Visit: Payer: Self-pay | Admitting: *Deleted

## 2014-05-27 ENCOUNTER — Other Ambulatory Visit: Payer: Medicare Other

## 2014-05-28 ENCOUNTER — Ambulatory Visit (HOSPITAL_BASED_OUTPATIENT_CLINIC_OR_DEPARTMENT_OTHER): Payer: Medicare Other

## 2014-05-28 ENCOUNTER — Telehealth: Payer: Self-pay | Admitting: Hematology

## 2014-05-28 DIAGNOSIS — C3432 Malignant neoplasm of lower lobe, left bronchus or lung: Secondary | ICD-10-CM

## 2014-05-28 MED ORDER — CYANOCOBALAMIN 1000 MCG/ML IJ SOLN
1000.0000 ug | Freq: Once | INTRAMUSCULAR | Status: AC
  Administered 2014-05-28: 1000 ug via INTRAMUSCULAR

## 2014-05-28 NOTE — Patient Instructions (Signed)

## 2014-05-28 NOTE — Telephone Encounter (Signed)
gv and printed appt sched/avs and letter to pt...sed added tx.

## 2014-05-30 ENCOUNTER — Ambulatory Visit: Payer: Medicare Other

## 2014-06-01 LAB — AFB CULTURE WITH SMEAR (NOT AT ARMC): ACID FAST SMEAR: NONE SEEN

## 2014-06-03 ENCOUNTER — Ambulatory Visit (HOSPITAL_BASED_OUTPATIENT_CLINIC_OR_DEPARTMENT_OTHER): Payer: Medicare Other

## 2014-06-03 DIAGNOSIS — Z5111 Encounter for antineoplastic chemotherapy: Secondary | ICD-10-CM

## 2014-06-03 DIAGNOSIS — C3432 Malignant neoplasm of lower lobe, left bronchus or lung: Secondary | ICD-10-CM

## 2014-06-03 MED ORDER — SODIUM CHLORIDE 0.9 % IV SOLN
475.0000 mg | Freq: Once | INTRAVENOUS | Status: AC
  Administered 2014-06-03: 480 mg via INTRAVENOUS
  Filled 2014-06-03: qty 48

## 2014-06-03 MED ORDER — ONDANSETRON 16 MG/50ML IVPB (CHCC)
16.0000 mg | Freq: Once | INTRAVENOUS | Status: AC
  Administered 2014-06-03: 16 mg via INTRAVENOUS

## 2014-06-03 MED ORDER — DEXAMETHASONE SODIUM PHOSPHATE 20 MG/5ML IJ SOLN
20.0000 mg | Freq: Once | INTRAMUSCULAR | Status: AC
  Administered 2014-06-03: 20 mg via INTRAVENOUS

## 2014-06-03 MED ORDER — SODIUM CHLORIDE 0.9 % IV SOLN
500.0000 mg/m2 | Freq: Once | INTRAVENOUS | Status: AC
  Administered 2014-06-03: 1050 mg via INTRAVENOUS
  Filled 2014-06-03: qty 42

## 2014-06-03 MED ORDER — SODIUM CHLORIDE 0.9 % IV SOLN
Freq: Once | INTRAVENOUS | Status: AC
  Administered 2014-06-03: 09:00:00 via INTRAVENOUS

## 2014-06-03 MED ORDER — DEXAMETHASONE SODIUM PHOSPHATE 20 MG/5ML IJ SOLN
INTRAMUSCULAR | Status: AC
  Filled 2014-06-03: qty 5

## 2014-06-03 MED ORDER — ONDANSETRON 16 MG/50ML IVPB (CHCC)
INTRAVENOUS | Status: AC
  Filled 2014-06-03: qty 16

## 2014-06-03 NOTE — Patient Instructions (Addendum)
[facility] Discharge Instructions for Patients Receiving Chemotherapy  Today you received the following chemotherapy agents Alimta/Carboplatin  To help prevent nausea and vomiting after your treatment, we encourage you to take your nausea medication     If you develop nausea and vomiting that is not controlled by your nausea medication, call the clinic.   BELOW ARE SYMPTOMS THAT SHOULD BE REPORTED IMMEDIATELY:  *FEVER GREATER THAN 100.5 F  *CHILLS WITH OR WITHOUT FEVER  NAUSEA AND VOMITING THAT IS NOT CONTROLLED WITH YOUR NAUSEA MEDICATION  *UNUSUAL SHORTNESS OF BREATH  *UNUSUAL BRUISING OR BLEEDING  TENDERNESS IN MOUTH AND THROAT WITH OR WITHOUT PRESENCE OF ULCERS  *URINARY PROBLEMS  *BOWEL PROBLEMS  UNUSUAL RASH Items with * indicate a potential emergency and should be followed up as soon as possible.  Feel free to call the clinic you have any questions or concerns. The clinic phone number is [phone].   Carboplatin injection What is this medicine? CARBOPLATIN (KAR boe pla tin) is a chemotherapy drug. It targets fast dividing cells, like cancer cells, and causes these cells to die. This medicine is used to treat ovarian cancer and many other cancers. This medicine may be used for other purposes; ask your health care provider or pharmacist if you have questions. COMMON BRAND NAME(S): Paraplatin What should I tell my health care provider before I take this medicine? They need to know if you have any of these conditions: -blood disorders -hearing problems -kidney disease -recent or ongoing radiation therapy -an unusual or allergic reaction to carboplatin, cisplatin, other chemotherapy, other medicines, foods, dyes, or preservatives -pregnant or trying to get pregnant -breast-feeding How should I use this medicine? This drug is usually given as an infusion into a vein. It is administered in a hospital or clinic by a specially trained health care  professional. Talk to your pediatrician regarding the use of this medicine in children. Special care may be needed. Overdosage: If you think you have taken too much of this medicine contact a poison control center or emergency room at once. NOTE: This medicine is only for you. Do not share this medicine with others. What if I miss a dose? It is important not to miss a dose. Call your doctor or health care professional if you are unable to keep an appointment. What may interact with this medicine? -medicines for seizures -medicines to increase blood counts like filgrastim, pegfilgrastim, sargramostim -some antibiotics like amikacin, gentamicin, neomycin, streptomycin, tobramycin -vaccines Talk to your doctor or health care professional before taking any of these medicines: -acetaminophen -aspirin -ibuprofen -ketoprofen -naproxen This list may not describe all possible interactions. Give your health care provider a list of all the medicines, herbs, non-prescription drugs, or dietary supplements you use. Also tell them if you smoke, drink alcohol, or use illegal drugs. Some items may interact with your medicine. What should I watch for while using this medicine? Your condition will be monitored carefully while you are receiving this medicine. You will need important blood work done while you are taking this medicine. This drug may make you feel generally unwell. This is not uncommon, as chemotherapy can affect healthy cells as well as cancer cells. Report any side effects. Continue your course of treatment even though you feel ill unless your doctor tells you to stop. In some cases, you may be given additional medicines to help with side effects. Follow all directions for their use. Call your doctor or health care professional for advice if you get  a fever, chills or sore throat, or other symptoms of a cold or flu. Do not treat yourself. This drug decreases your body's ability to fight infections.  Try to avoid being around people who are sick. This medicine may increase your risk to bruise or bleed. Call your doctor or health care professional if you notice any unusual bleeding. Be careful brushing and flossing your teeth or using a toothpick because you may get an infection or bleed more easily. If you have any dental work done, tell your dentist you are receiving this medicine. Avoid taking products that contain aspirin, acetaminophen, ibuprofen, naproxen, or ketoprofen unless instructed by your doctor. These medicines may hide a fever. Do not become pregnant while taking this medicine. Women should inform their doctor if they wish to become pregnant or think they might be pregnant. There is a potential for serious side effects to an unborn child. Talk to your health care professional or pharmacist for more information. Do not breast-feed an infant while taking this medicine. What side effects may I notice from receiving this medicine? Side effects that you should report to your doctor or health care professional as soon as possible: -allergic reactions like skin rash, itching or hives, swelling of the face, lips, or tongue -signs of infection - fever or chills, cough, sore throat, pain or difficulty passing urine -signs of decreased platelets or bleeding - bruising, pinpoint red spots on the skin, black, tarry stools, nosebleeds -signs of decreased red blood cells - unusually weak or tired, fainting spells, lightheadedness -breathing problems -changes in hearing -changes in vision -chest pain -high blood pressure -low blood counts - This drug may decrease the number of white blood cells, red blood cells and platelets. You may be at increased risk for infections and bleeding. -nausea and vomiting -pain, swelling, redness or irritation at the injection site -pain, tingling, numbness in the hands or feet -problems with balance, talking, walking -trouble passing urine or change in the  amount of urine Side effects that usually do not require medical attention (report to your doctor or health care professional if they continue or are bothersome): -hair loss -loss of appetite -metallic taste in the mouth or changes in taste This list may not describe all possible side effects. Call your doctor for medical advice about side effects. You may report side effects to FDA at 1-800-FDA-1088. Where should I keep my medicine? This drug is given in a hospital or clinic and will not be stored at home. NOTE: This sheet is a summary. It may not cover all possible information. If you have questions about this medicine, talk to your doctor, pharmacist, or health care provider.  2015, Elsevier/Gold Standard. (2007-08-15 14:38:05)  Pemetrexed injection What is this medicine? PEMETREXED (PEM e TREX ed) is a chemotherapy drug. This medicine affects cells that are rapidly growing, such as cancer cells and cells in your mouth and stomach. It is usually used to treat lung cancers like non-small cell lung cancer and mesothelioma. It may also be used to treat other cancers. This medicine may be used for other purposes; ask your health care provider or pharmacist if you have questions. COMMON BRAND NAME(S): Alimta What should I tell my health care provider before I take this medicine? They need to know if you have any of these conditions: -if you frequently drink alcohol containing beverages -infection (especially a virus infection such as chickenpox, cold sores, or herpes) -kidney disease -liver disease -low blood counts, like low platelets, red bloods,  or white blood cells -an unusual or allergic reaction to pemetrexed, mannitol, other medicines, foods, dyes, or preservatives -pregnant or trying to get pregnant -breast-feeding How should I use this medicine? This drug is given as an infusion into a vein. It is administered in a hospital or clinic by a specially trained health care  professional. Talk to your pediatrician regarding the use of this medicine in children. Special care may be needed. Overdosage: If you think you have taken too much of this medicine contact a poison control center or emergency room at once. NOTE: This medicine is only for you. Do not share this medicine with others. What if I miss a dose? It is important not to miss your dose. Call your doctor or health care professional if you are unable to keep an appointment. What may interact with this medicine? -aspirin and aspirin-like medicines -medicines to increase blood counts like filgrastim, pegfilgrastim, sargramostim -methotrexate -NSAIDS, medicines for pain and inflammation, like ibuprofen or naproxen -probenecid -pyrimethamine -vaccines Talk to your doctor or health care professional before taking any of these medicines: -acetaminophen -aspirin -ibuprofen -ketoprofen -naproxen This list may not describe all possible interactions. Give your health care provider a list of all the medicines, herbs, non-prescription drugs, or dietary supplements you use. Also tell them if you smoke, drink alcohol, or use illegal drugs. Some items may interact with your medicine. What should I watch for while using this medicine? Visit your doctor for checks on your progress. This drug may make you feel generally unwell. This is not uncommon, as chemotherapy can affect healthy cells as well as cancer cells. Report any side effects. Continue your course of treatment even though you feel ill unless your doctor tells you to stop. In some cases, you may be given additional medicines to help with side effects. Follow all directions for their use. Call your doctor or health care professional for advice if you get a fever, chills or sore throat, or other symptoms of a cold or flu. Do not treat yourself. This drug decreases your body's ability to fight infections. Try to avoid being around people who are sick. This  medicine may increase your risk to bruise or bleed. Call your doctor or health care professional if you notice any unusual bleeding. Be careful brushing and flossing your teeth or using a toothpick because you may get an infection or bleed more easily. If you have any dental work done, tell your dentist you are receiving this medicine. Avoid taking products that contain aspirin, acetaminophen, ibuprofen, naproxen, or ketoprofen unless instructed by your doctor. These medicines may hide a fever. Call your doctor or health care professional if you get diarrhea or mouth sores. Do not treat yourself. To protect your kidneys, drink water or other fluids as directed while you are taking this medicine. Men and women must use effective birth control while taking this medicine. You may also need to continue using effective birth control for a time after stopping this medicine. Do not become pregnant while taking this medicine. Tell your doctor right away if you think that you or your partner might be pregnant. There is a potential for serious side effects to an unborn child. Talk to your health care professional or pharmacist for more information. Do not breast-feed an infant while taking this medicine. This medicine may lower sperm counts. What side effects may I notice from receiving this medicine? Side effects that you should report to your doctor or health care professional as soon as  possible: -allergic reactions like skin rash, itching or hives, swelling of the face, lips, or tongue -low blood counts - this medicine may decrease the number of white blood cells, red blood cells and platelets. You may be at increased risk for infections and bleeding. -signs of infection - fever or chills, cough, sore throat, pain or difficulty passing urine -signs of decreased platelets or bleeding - bruising, pinpoint red spots on the skin, black, tarry stools, blood in the urine -signs of decreased red blood cells -  unusually weak or tired, fainting spells, lightheadedness -breathing problems, like a dry cough -changes in emotions or moods -chest pain -confusion -diarrhea -high blood pressure -mouth or throat sores or ulcers -pain, swelling, warmth in the leg -pain on swallowing -swelling of the ankles, feet, hands -trouble passing urine or change in the amount of urine -vomiting -yellowing of the eyes or skin Side effects that usually do not require medical attention (report to your doctor or health care professional if they continue or are bothersome): -hair loss -loss of appetite -nausea -stomach upset This list may not describe all possible side effects. Call your doctor for medical advice about side effects. You may report side effects to FDA at 1-800-FDA-1088. Where should I keep my medicine? This drug is given in a hospital or clinic and will not be stored at home. NOTE: This sheet is a summary. It may not cover all possible information. If you have questions about this medicine, talk to your doctor, pharmacist, or health care provider.  2015, Elsevier/Gold Standard. (2007-12-12 13:24:03)

## 2014-06-05 ENCOUNTER — Telehealth: Payer: Self-pay

## 2014-06-05 ENCOUNTER — Ambulatory Visit: Payer: Medicare Other | Admitting: Hematology

## 2014-06-05 NOTE — Telephone Encounter (Signed)
-----   Message from [facility] [place] sent at 06/03/2014 11:27 AM EST ----- Regarding: "1st time chemotherapy" Patient received Alimta and Carboplatin today for the 1st time, per Dr. Burr Medico. Tolerated treatment well with no complaints.

## 2014-06-05 NOTE — Telephone Encounter (Signed)
No mouth sores, urinating OK. Feels like abdomen is getting tight, bloating. Did have BM this AM. Pt eating light today b/c food makes her feel tighter. Discussed using stool softener or laxative as needed. To call if bloating worsens or other sx develop. Pt drinking OK.

## 2014-06-10 ENCOUNTER — Encounter: Payer: Self-pay | Admitting: Physician Assistant

## 2014-06-10 ENCOUNTER — Telehealth: Payer: Self-pay | Admitting: Physician Assistant

## 2014-06-10 ENCOUNTER — Other Ambulatory Visit (HOSPITAL_BASED_OUTPATIENT_CLINIC_OR_DEPARTMENT_OTHER): Payer: Medicare Other

## 2014-06-10 ENCOUNTER — Ambulatory Visit (HOSPITAL_BASED_OUTPATIENT_CLINIC_OR_DEPARTMENT_OTHER): Payer: Medicare Other | Admitting: Physician Assistant

## 2014-06-10 ENCOUNTER — Other Ambulatory Visit: Payer: Self-pay | Admitting: Physician Assistant

## 2014-06-10 VITALS — BP 124/77 | HR 134 | Temp 98.4°F | Resp 19 | Ht 69.0 in | Wt 195.6 lb

## 2014-06-10 DIAGNOSIS — C7802 Secondary malignant neoplasm of left lung: Secondary | ICD-10-CM

## 2014-06-10 DIAGNOSIS — C3432 Malignant neoplasm of lower lobe, left bronchus or lung: Secondary | ICD-10-CM

## 2014-06-10 DIAGNOSIS — C7801 Secondary malignant neoplasm of right lung: Secondary | ICD-10-CM

## 2014-06-10 DIAGNOSIS — C3490 Malignant neoplasm of unspecified part of unspecified bronchus or lung: Secondary | ICD-10-CM

## 2014-06-10 DIAGNOSIS — C7931 Secondary malignant neoplasm of brain: Secondary | ICD-10-CM

## 2014-06-10 DIAGNOSIS — C787 Secondary malignant neoplasm of liver and intrahepatic bile duct: Secondary | ICD-10-CM

## 2014-06-10 LAB — COMPREHENSIVE METABOLIC PANEL (CC13)
ALT: 15 U/L (ref 0–55)
ANION GAP: 13 meq/L — AB (ref 3–11)
AST: 11 U/L (ref 5–34)
Albumin: 3 g/dL — ABNORMAL LOW (ref 3.5–5.0)
Alkaline Phosphatase: 56 U/L (ref 40–150)
BILIRUBIN TOTAL: 1.42 mg/dL — AB (ref 0.20–1.20)
BUN: 18.9 mg/dL (ref 7.0–26.0)
CALCIUM: 9.5 mg/dL (ref 8.4–10.4)
CO2: 31 meq/L — AB (ref 22–29)
CREATININE: 1 mg/dL (ref 0.6–1.1)
Chloride: 95 mEq/L — ABNORMAL LOW (ref 98–109)
EGFR: 62 mL/min/{1.73_m2} — ABNORMAL LOW (ref >90)
Glucose: 105 mg/dl (ref 70–140)
Potassium: 3.7 mEq/L (ref 3.5–5.1)
Sodium: 139 mEq/L (ref 136–145)
TOTAL PROTEIN: 6.9 g/dL (ref 6.4–8.3)

## 2014-06-10 LAB — CBC WITH DIFFERENTIAL/PLATELET
BASO%: 0.2 % (ref 0.0–2.0)
Basophils Absolute: 0 10e3/uL (ref 0.0–0.1)
EOS%: 0.8 % (ref 0.0–7.0)
Eosinophils Absolute: 0.1 10e3/uL (ref 0.0–0.5)
HCT: 38.2 % (ref 34.8–46.6)
HGB: 13.4 g/dL (ref 11.6–15.9)
LYMPH%: 27.4 % (ref 14.0–49.7)
MCH: 26.1 pg (ref 25.1–34.0)
MCHC: 35.1 g/dL (ref 31.5–36.0)
MCV: 74.5 fL — ABNORMAL LOW (ref 79.5–101.0)
MONO#: 0.6 10e3/uL (ref 0.1–0.9)
MONO%: 9.3 % (ref 0.0–14.0)
NEUT#: 4 10e3/uL (ref 1.5–6.5)
NEUT%: 62.3 % (ref 38.4–76.8)
Platelets: 162 10e3/uL (ref 145–400)
RBC: 5.13 10e6/uL (ref 3.70–5.45)
RDW: 16.4 % — ABNORMAL HIGH (ref 11.2–14.5)
WBC: 6.5 10e3/uL (ref 3.9–10.3)
lymph#: 1.8 10e3/uL (ref 0.9–3.3)

## 2014-06-10 NOTE — Patient Instructions (Signed)
Include the foods in your diet to help improve your bowel movements. Take the Senokot-S and MiraLax as advised Follow up in 2 weeks, prior to your next scheduled cycle of chemotherapy

## 2014-06-10 NOTE — Telephone Encounter (Signed)
Gave avs & cal for Feb. °

## 2014-06-10 NOTE — Progress Notes (Signed)
[place]  Telephone:(336) [phone] Fax:([phone]) [place] Note   Patient Care Team: Christain Sacramento, MD as PCP - General (Family Medicine) 06/10/2014  CHIEF COMPLAINTS Follow up  Oncology History   Lung cancer   Staging form: Lung, AJCC 7th Edition     Clinical: Stage IV (T3, N2, M1b) - Signed by Truitt Merle, MD on 05/23/2014       Lung cancer   04/19/2014 Imaging brain MRI showed a hemorrhagic mass 1.9cm in r oarital loibe and a 1.6cm lesion in left front lobe, with significant surrounding edema.    04/25/2014 Initial Diagnosis LLL lung adenocarcinoma, diagnosed through CT guided biopsy, TTF-1(+), CK5/6 (+), with metastases to b/l lungs, brain and liver. She presented wiht left side numbness and possible seizure.    04/30/2014 Imaging PET scan showed a 8cm hypermetabolic LLL lung mass, mediastinal adenopathy, multiple small lung nodules (non hypermetabolic) and a single hypermetabolic liver lesion in segment 3.    05/09/2014 -  Radiation Therapy SBRT to brain lesion    MOLECULAR TESTING ([facility]) KRAS G12C ATRXK 1169 [id number] RICTOR amplification  TP53 A83P Negative for EGFR, ALK, MET, HER2, ROS-1, BRAF etc.   PRIOR THERAPY: Status post SRS treatment to 5 brain lesions under the care Dr. [name] completed 05/09/2014  CURRENT THERAPY: Systemic chemotherapy with carboplatin for an AUC of 5 and Alimta at 500 mg/m given every 3 weeks. Status post 1 cycle  INTERIM HISTORY Ms. Calixto presents for symptom management visit after completing her first cycle of systemic chemotherapy with carboplatin and Alimta. Prior to starting chemotherapy she was treated with SRS treatment to 5 brain lesions under the care of Dr. [name]. She completed her dexamethasone taper. She does not complain of any headaches or blurred or double vision. She has slight decreased appetite but otherwise is doing well. Overall she tolerated her first cycle of chemotherapy  relatively well with the exception of significant constipation. She is only tried drinking prune juice as a relief for her constipation without any significant results. She complains of tightness in her abdomen has a result of the constipation causing her to sit up at night for relief of the discomfort. She denied any fever or chills, nausea or vomiting, no diarrhea. She denied cough or hemoptysis, shortness of breath. She's had no significant weight loss or night sweats.   MEDICAL HISTORY:  Past Medical History  Diagnosis Date  . Hypertension   . Hyperlipidemia   . Knee injury 1983    left  . Lung cancer   . Brain cancer     SURGICAL HISTORY: Past Surgical History  Procedure Laterality Date  . Video bronchoscopy Bilateral 04/19/2014    Procedure: VIDEO BRONCHOSCOPY WITHOUT FLUORO;  Surgeon: Rigoberto Noel, MD;  Location: [facility];  Service: Cardiopulmonary;  Laterality: Bilateral;  . Abdominal hysterectomy  1983    SOCIAL HISTORY: History   Social History  . Marital Status: Single    Spouse Name: N/A    Number of Children: N/A  . Years of Education: N/A   Occupational History  . Not on file.   Social History Main Topics  . Smoking status: Former Research scientist (life sciences)  . Smokeless tobacco: Current User  . Alcohol Use: Yes     Comment: 2 oz/week  . Drug Use: No  . Sexual Activity: Not Currently   Other Topics Concern  . Not on file   Social History Narrative    FAMILY HISTORY: Family History  Problem  Relation Age of Onset  . Cancer Sister 71    unknown detail     ALLERGIES:  has No Known Allergies.  MEDICATIONS:  Current Outpatient Prescriptions  Medication Sig Dispense Refill  . aspirin 81 MG tablet Take 81 mg by mouth daily.    . atorvastatin (LIPITOR) 10 MG tablet Take 10 mg by mouth daily.  0  . cholecalciferol (VITAMIN D) 1000 UNITS tablet Take 1,000 Units by mouth daily.    . folic acid (FOLVITE) 1 MG tablet Take 1 tablet (1 mg total) by mouth daily. Start 5-7  days before Alimta chemotherapy. Continue until 21 days after Alimta completed. 100 tablet 3  . hydrochlorothiazide (HYDRODIURIL) 25 MG tablet Take 25 mg by mouth every morning.     . levETIRAcetam (KEPPRA) 1000 MG tablet Take 1 tablet (1,000 mg total) by mouth 2 (two) times daily. 60 tablet 0  . ondansetron (ZOFRAN) 8 MG tablet Take 1 tablet (8 mg total) by mouth every 8 (eight) hours as needed for nausea or vomiting. 20 tablet 0   No current facility-administered medications for this visit.    REVIEW OF SYSTEMS:   Constitutional: Denies fevers, chills or abnormal night sweats Eyes: Denies blurriness of vision, double vision or watery eyes Ears, nose, mouth, throat, and face: Denies mucositis or sore throat Respiratory: Denies cough, dyspnea or wheezes Cardiovascular: Denies palpitation, chest discomfort or lower extremity swelling Gastrointestinal:  Denies nausea, heartburn but does complain of constipation  Skin: Denies abnormal skin rashes Lymphatics: Denies new lymphadenopathy or easy bruising Neurological: see HPI  Behavioral/Psych: Mood is stable, no new changes  All other systems were reviewed with the patient and are negative.  PHYSICAL EXAMINATION: ECOG PERFORMANCE STATUS: 1 - Symptomatic but completely ambulatory  Filed Vitals:   06/10/14 1118  BP: 124/77  Pulse: 134  Temp: 98.4 F (36.9 C)  Resp: 19   Filed Weights   06/10/14 1118  Weight: 195 lb 9.6 oz (88.724 kg)    GENERAL:alert, no distress and comfortable SKIN: skin color, texture, turgor are normal, no rashes or significant lesions EYES: normal, conjunctiva are pink and non-injected, sclera clear OROPHARYNX:no exudate, no erythema and lips, buccal mucosa, and tongue normal  NECK: supple, thyroid normal size, non-tender, without nodularity LYMPH:  no palpable lymphadenopathy in the cervical, axillary or inguinal LUNGS: clear to auscultation and percussion with normal breathing effort HEART: regular rate &  rhythm and no murmurs and no lower extremity edema ABDOMEN:abdomen soft, non-tender and normal bowel sounds Musculoskeletal:no cyanosis of digits and no clubbing  PSYCH: alert & oriented x 3 with fluent speech NEURO: no focal motor/sensory deficits  LABORATORY DATA:  I have reviewed the data as listed CBC Latest Ref Rng 06/10/2014 05/23/2014 04/25/2014  WBC 3.9 - 10.3 10e3/uL 6.5 17.2(H) 19.2(H)  Hemoglobin 11.6 - 15.9 g/dL 13.4 13.9 15.2(H)  Hematocrit 34.8 - 46.6 % 38.2 43.6 43.1  Platelets 145 - 400 10e3/uL 162 210 342    CMP Latest Ref Rng 06/10/2014 05/23/2014 04/18/2014  Glucose 70 - 140 mg/dl 105 87 110(H)  BUN 7.0 - 26.0 mg/dL 18.9 17.4 10  Creatinine 0.6 - 1.1 mg/dL 1.0 0.8 0.77  Sodium 136 - 145 mEq/L 139 142 140  Potassium 3.5 - 5.1 mEq/L 3.7 3.9 3.2(L)  Chloride 96 - 112 mEq/L - - 100  CO2 22 - 29 mEq/L 31(H) 31(H) 27  Calcium 8.4 - 10.4 mg/dL 9.5 9.4 8.5  Total Protein 6.4 - 8.3 g/dL 6.9 6.6 7.1  Total Bilirubin   0.20 - 1.20 mg/dL 1.42(H) 1.02 0.3  Alkaline Phos 40 - 150 U/L 56 50 73  AST 5 - 34 U/L _0 ALT 0 - 55 U/L _1 PATHOLOGY REPORT 04/25/2014 Lung, needle/core biopsy(ies), LLL - NON SMALL CELL CARCINOMA. Microscopic Comment Immunohistochemistry will be performed and reported as an addendum. (JDP:gt, 04/26/14) ADDENDUM: Immunohistochemistry is performed and the tumor is positive with thyroid transcription factor-1 (TTF-1), and shows focal positivity with cytokeratin 5/6. The immunophenotype is consistent with primary lung adenocarcinoma. (JDP:gt, 04/29/14)    RADIOGRAPHIC STUDIES: Mr Kizzie Fantasia Contrast 04/19/2014      IMPRESSION:  1. Hemorrhagic mass lesion in the high right parietal lobe measures 19 x 14 x 17 mm.  2. Enhancing mass lesion in the posterior left frontal lobe white matter measures 15 x 16 x 13 mm.  3. There is significant surrounding vasogenic edema with both lesions. These are most compatible with metastases.  4. Moderate  generalized atrophy and diffuse white matter disease. This likely reflects the sequelae of chronic microvascular ischemia.     PET 04/30/2014 1. 8.0 x 4.7 cm pleural-based left lower lobe mass with satellite nodule in the left lower lobe, left hilar and mediastinal adenopathy (extending into the superior mediastinum), widespread disease in the left pleural space, and solitary hepatic metastasis. Findings are compatible with stage IV lung cancer. 2. Multiple other smaller pulmonary nodules are nonspecific, but unchanged compared to the prior examination. Metastatic disease to the lungs is not excluded, and continued attention on future followup studies is recommended. 3. 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 4. Additional incidental findings, as above.   ASSESSMENT & PLAN:  A 74 year old African-American female with past medical history of hypertension, dyslipidemia, and smoking, who presents left-sided numbness and possible seizure episodes. A CT of chest reviewed a large left upper lobe lung mass, hilar and mediastinal adenopathy, and 2 brain lesions with significant surrounding edema. 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 CT-guided lung mass biopsy showed TTF-1 positive non-small cell carcinoma, consistent with primary lung adenocarcinoma.  1. 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 Burr Medico discussed that her tumor is incurable at this stage, the overall prognosis is very poor. the goal of treatment is palliation to prolong her life and preserve her quality of life. -Dr. Burr Medico discussed the foundation one result with her, unfortunately, there is no actionable mutations, and her tumor is KRAS (+)  -She has good performance status and preserved organ  function, she's currently being treated with systemic chemotherapy in the form of carboplatin for an AUC of 5 and pemetrexed at 500 mg/m given every 3 weeks, status post 1 cycle. Overall she tolerated this first cycle relatively well with the exception of constipation. The constipation we talked about many dietary changes that she can make including being sure that she is drinking plenty of fluids, increasing her roughage such as greens apples, applesauce and similar fruits and vegetables. She will also start Senokot S2 tablets by mouth twice daily and at this is unsuccessful she will add mural lacks daily. Patient voiced understanding. She will continue with weekly labs as previously scheduled and follow-up with Dr. Burr Medico in 2 weeks prior to the start of cycle #2 of her systemic chemotherapy with carboplatin and pemetrexed. A total  of 4-6 cycles of chemotherapy are planned. Dr. Burr Medico also discussed the benefit of Avastin, due to her hemorrhagic brain lesion, she would not use at this point. If her brain metastasis response to radiation very well, with minimal residual disease, she may consider adding Avastin in the future.   2. 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 [name], repeat brain MRI in 3 months.  3. HTN -Continue medication next  4. Social support, -She lives alone, has one son in [place]. She does have some support from her neighbors.  - Plan -Continue weekly labs as scheduled -Follow up with Dr. Burr Medico in 2 weeks prior to the start of cycle #2 of of her chemotherapy.  All questions were answered. The patient knows to call the clinic with any problems, questions or concerns.  I spent 25 minutes counseling the patient face to face. The total time spent in the appointment was 30 minutes and more than 50% was on counseling.     Carlton Adam, PA-C 06/10/2014 2:03 PM

## 2014-06-17 NOTE — Progress Notes (Signed)
Radiation Oncology         [phone] ________________________________  Name: Michelle Cobb MRN: [id number]  Date: 06/19/2014  DOB: January 23, 1941      Follow-Up Visit Note  CC: Woody Seller, MD  Christain Sacramento, MD  Diagnosis:   74 year old woman with brain metastases from non-small cell carcinoma of the left lower lung s/p cranial SRS on  05/09/2014 to five targets (Left parietal 13 mm, Right parietal 12 mm, Right cerebellar 2 mm, Pontine (brainstem) 2 mm, Right occipital 2 mm) to 20 Gy     ICD-9-CM ICD-10-CM   1. Brain metastasis 198.3 C79.31 oxyCODONE-acetaminophen (PERCOCET/ROXICET) 5-325 MG per tablet    Interval Since Last Radiation:  4  weeks  Narrative:  The patient returns today for routine follow-up.  Labs drawn this morning. Tapered off decadron. Edema of both ankles noted. Denies headache, dizziness, nausea, vomiting, or ringing in the ears. Reports floaters resolved but, denies diplopia. Complaining of tight pain in left side worse when she lays down. Taking keppra as directed. Denies seizure activity. Reports SOB with exertion. Reports she feels weak and lacks energy. Reports her appetite it fine but, she is afraid to eat because she is uncertain of the source of her pain in her left side. Reports having a form bowel movement this morning. Heart rate elevated. BP stable                              ALLERGIES:  has No Known Allergies.  Meds: Current Outpatient Prescriptions  Medication Sig Dispense Refill  . aspirin 81 MG tablet Take 81 mg by mouth daily.    Marland Kitchen atorvastatin (LIPITOR) 10 MG tablet Take 10 mg by mouth daily.  0  . cholecalciferol (VITAMIN D) 1000 UNITS tablet Take 1,000 Units by mouth daily.    . folic acid (FOLVITE) 1 MG tablet Take 1 tablet (1 mg total) by mouth daily. Start 5-7 days before Alimta chemotherapy. Continue until 21 days after Alimta completed. 100 tablet 3  . hydrochlorothiazide (HYDRODIURIL) 25 MG tablet Take 25 mg by mouth every  morning.     . levETIRAcetam (KEPPRA) 1000 MG tablet Take 1 tablet (1,000 mg total) by mouth 2 (two) times daily. 60 tablet 0  . ondansetron (ZOFRAN) 8 MG tablet Take 1 tablet (8 mg total) by mouth every 8 (eight) hours as needed for nausea or vomiting. (Patient not taking: Reported on 06/19/2014) 20 tablet 0  . oxyCODONE-acetaminophen (PERCOCET/ROXICET) 5-325 MG per tablet Take 1-2 tablets by mouth every 6 (six) hours as needed for severe pain. 100 tablet 0   No current facility-administered medications for this encounter.    Physical Findings: The patient is in no acute distress. Patient is alert and oriented.  weight is 193 lb 11.2 oz (87.862 kg). Her blood pressure is 108/62 and her pulse is 111. Her respiration is 16 and oxygen saturation is 100%. .  No significant changes.  Lab Findings: Lab Results  Component Value Date   WBC 9.0 06/19/2014   HGB 11.8 06/19/2014   HCT 33.7* 06/19/2014   MCV 74.6* 06/19/2014   PLT 298 06/19/2014    @LASTCHEM @  Radiographic Findings: No results found.  Impression:  The patient is recovering from the effects of radiation.  I suspect her LUQ abd pain is party from rib lesions in that area.  Plan:  Start Percocet.  If rib pain persists, may consider palliative RT.  MRI brain  and follow-up in 2 months.  _____________________________________  [name]. [name], M.D.

## 2014-06-18 ENCOUNTER — Telehealth: Payer: Self-pay | Admitting: Hematology

## 2014-06-18 ENCOUNTER — Telehealth: Payer: Self-pay | Admitting: *Deleted

## 2014-06-18 ENCOUNTER — Other Ambulatory Visit: Payer: Self-pay | Admitting: *Deleted

## 2014-06-18 NOTE — Telephone Encounter (Signed)
Pt informed to come for lab @ 10am before radiation MD appt 06/19/14.

## 2014-06-18 NOTE — Telephone Encounter (Signed)
-----   Message from Truitt Merle, MD sent at 06/16/2014  6:12 PM EST ----- Shirlean Schlein saw her on 1/18, and she had tbil elevated at 1.4. Not sure if you saw that.  Betty Brooks, could you send a POF and ask her to stop by at lab on 1/27 when she comes in to see rad/onc?   Thanks.  Krista Blue  ----- Message -----    From: [facility] Interface    Sent: 06/10/2014  11:08 AM      To: Truitt Merle, MD

## 2014-06-18 NOTE — Telephone Encounter (Signed)
Labs added for 01/27 per 01/26 POF, pt is aware and advised to be here at 10am.... KJ

## 2014-06-19 ENCOUNTER — Ambulatory Visit
Admission: RE | Admit: 2014-06-19 | Discharge: 2014-06-19 | Disposition: A | Discharge status: Home / Self Care | Payer: Medicare Other | Source: Ambulatory Visit | Attending: Radiation Oncology | Admitting: Radiation Oncology

## 2014-06-19 ENCOUNTER — Other Ambulatory Visit: Payer: Self-pay | Admitting: Medical Oncology

## 2014-06-19 ENCOUNTER — Encounter: Payer: Self-pay | Admitting: Radiation Oncology

## 2014-06-19 ENCOUNTER — Ambulatory Visit: Payer: Medicare Other

## 2014-06-19 ENCOUNTER — Other Ambulatory Visit (HOSPITAL_BASED_OUTPATIENT_CLINIC_OR_DEPARTMENT_OTHER): Payer: Medicare Other

## 2014-06-19 VITALS — BP 108/62 | HR 111 | Resp 16 | Wt 193.7 lb

## 2014-06-19 DIAGNOSIS — C7931 Secondary malignant neoplasm of brain: Secondary | ICD-10-CM

## 2014-06-19 DIAGNOSIS — C3432 Malignant neoplasm of lower lobe, left bronchus or lung: Secondary | ICD-10-CM

## 2014-06-19 DIAGNOSIS — E876 Hypokalemia: Secondary | ICD-10-CM

## 2014-06-19 LAB — COMPREHENSIVE METABOLIC PANEL (CC13)
ALBUMIN: 2.8 g/dL — AB (ref 3.5–5.0)
ALK PHOS: 189 U/L — AB (ref 40–150)
ALT: 100 U/L — ABNORMAL HIGH (ref 0–55)
ANION GAP: 17 meq/L — AB (ref 3–11)
AST: 20 U/L (ref 5–34)
BUN: 8.7 mg/dL (ref 7.0–26.0)
CHLORIDE: 96 meq/L — AB (ref 98–109)
CO2: 29 mEq/L (ref 22–29)
CREATININE: 0.7 mg/dL (ref 0.6–1.1)
Calcium: 9.1 mg/dL (ref 8.4–10.4)
EGFR: >90 mL/min/{1.73_m2} (ref >90)
Glucose: 92 mg/dl (ref 70–140)
POTASSIUM: 2.8 meq/L — AB (ref 3.5–5.1)
SODIUM: 141 meq/L (ref 136–145)
Total Bilirubin: 1.03 mg/dL (ref 0.20–1.20)
Total Protein: 6.9 g/dL (ref 6.4–8.3)

## 2014-06-19 LAB — CBC WITH DIFFERENTIAL/PLATELET
BASO%: 0.4 % (ref 0.0–2.0)
Basophils Absolute: 0 10*3/uL (ref 0.0–0.1)
EOS ABS: 0.2 10*3/uL (ref 0.0–0.5)
EOS%: 2.6 % (ref 0.0–7.0)
HEMATOCRIT: 33.7 % — AB (ref 34.8–46.6)
HEMOGLOBIN: 11.8 g/dL (ref 11.6–15.9)
LYMPH%: 18.6 % (ref 14.0–49.7)
MCH: 26.1 pg (ref 25.1–34.0)
MCHC: 35 g/dL (ref 31.5–36.0)
MCV: 74.6 fL — AB (ref 79.5–101.0)
MONO#: 1 10*3/uL — ABNORMAL HIGH (ref 0.1–0.9)
MONO%: 10.9 % (ref 0.0–14.0)
NEUT%: 67.5 % (ref 38.4–76.8)
NEUTROS ABS: 6.1 10*3/uL (ref 1.5–6.5)
PLATELETS: 298 10*3/uL (ref 145–400)
RBC: 4.52 10*6/uL (ref 3.70–5.45)
RDW: 16.8 % — ABNORMAL HIGH (ref 11.2–14.5)
WBC: 9 10*3/uL (ref 3.9–10.3)
lymph#: 1.7 10*3/uL (ref 0.9–3.3)

## 2014-06-19 MED ORDER — POTASSIUM CHLORIDE CRYS ER 20 MEQ PO TBCR: 20.0000 meq | EXTENDED_RELEASE_TABLET | Freq: Every day | ORAL | Status: DC

## 2014-06-19 MED ORDER — OXYCODONE-ACETAMINOPHEN 5-325 MG PO TABS: 1.0000 | ORAL_TABLET | Freq: Four times a day (QID) | ORAL | Status: DC | PRN

## 2014-06-19 NOTE — Progress Notes (Signed)
Labs drawn this morning. Tapered off decadron. Edema of both ankles noted. Denies headache, dizziness, nausea, vomiting, or ringing in the ears. Reports floaters resolved but, denies diplopia. Complaining of tight pain in left side worse when she lays down. Taking keppra as directed. Denies seizure activity. Reports SOB with exertion. Reports she feels weak and lacks energy. Reports her appetite it fine but, she is afraid to eat because she is uncertain of the source of her pain in her left side. Reports having a form bowel movement this morning. Heart rate elevated. BP stable.

## 2014-06-19 NOTE — Progress Notes (Signed)
kdur rx sent to pt pharmacy and pt notified to pick up rx and start today.

## 2014-06-24 ENCOUNTER — Encounter: Payer: Self-pay | Admitting: Hematology

## 2014-06-24 ENCOUNTER — Telehealth: Payer: Self-pay | Admitting: *Deleted

## 2014-06-24 ENCOUNTER — Encounter (HOSPITAL_COMMUNITY): Payer: Self-pay

## 2014-06-24 ENCOUNTER — Inpatient Hospital Stay (HOSPITAL_COMMUNITY)
Admission: AD | Admit: 2014-06-24 | Discharge: 2014-06-27 | DRG: 299 | Disposition: A | Discharge status: Home / Self Care | Payer: Medicare Other | Source: Ambulatory Visit | Attending: Family Medicine | Admitting: Family Medicine

## 2014-06-24 ENCOUNTER — Ambulatory Visit (HOSPITAL_COMMUNITY)
Admission: RE | Admit: 2014-06-24 | Discharge: 2014-06-24 | Disposition: A | Discharge status: Home / Self Care | Payer: Medicare Other | Source: Ambulatory Visit | Attending: Internal Medicine | Admitting: Internal Medicine

## 2014-06-24 ENCOUNTER — Ambulatory Visit (HOSPITAL_BASED_OUTPATIENT_CLINIC_OR_DEPARTMENT_OTHER): Payer: Medicare Other | Admitting: Hematology

## 2014-06-24 ENCOUNTER — Other Ambulatory Visit (HOSPITAL_BASED_OUTPATIENT_CLINIC_OR_DEPARTMENT_OTHER): Payer: Medicare Other

## 2014-06-24 ENCOUNTER — Ambulatory Visit (HOSPITAL_BASED_OUTPATIENT_CLINIC_OR_DEPARTMENT_OTHER): Payer: Medicare Other

## 2014-06-24 VITALS — BP 115/72 | HR 115 | Temp 99.0°F | Resp 18 | Ht 69.0 in | Wt 195.9 lb

## 2014-06-24 DIAGNOSIS — E876 Hypokalemia: Secondary | ICD-10-CM | POA: Diagnosis present

## 2014-06-24 DIAGNOSIS — J9601 Acute respiratory failure with hypoxia: Secondary | ICD-10-CM | POA: Diagnosis present

## 2014-06-24 DIAGNOSIS — C787 Secondary malignant neoplasm of liver and intrahepatic bile duct: Secondary | ICD-10-CM

## 2014-06-24 DIAGNOSIS — C7800 Secondary malignant neoplasm of unspecified lung: Secondary | ICD-10-CM

## 2014-06-24 DIAGNOSIS — D630 Anemia in neoplastic disease: Secondary | ICD-10-CM | POA: Diagnosis present

## 2014-06-24 DIAGNOSIS — I82409 Acute embolism and thrombosis of unspecified deep veins of unspecified lower extremity: Principal | ICD-10-CM | POA: Diagnosis present

## 2014-06-24 DIAGNOSIS — C3432 Malignant neoplasm of lower lobe, left bronchus or lung: Secondary | ICD-10-CM

## 2014-06-24 DIAGNOSIS — C7931 Secondary malignant neoplasm of brain: Secondary | ICD-10-CM

## 2014-06-24 DIAGNOSIS — Z79899 Other long term (current) drug therapy: Secondary | ICD-10-CM

## 2014-06-24 DIAGNOSIS — M7989 Other specified soft tissue disorders: Secondary | ICD-10-CM

## 2014-06-24 DIAGNOSIS — C3490 Malignant neoplasm of unspecified part of unspecified bronchus or lung: Secondary | ICD-10-CM | POA: Diagnosis present

## 2014-06-24 DIAGNOSIS — Z5111 Encounter for antineoplastic chemotherapy: Secondary | ICD-10-CM

## 2014-06-24 DIAGNOSIS — C3492 Malignant neoplasm of unspecified part of left bronchus or lung: Secondary | ICD-10-CM

## 2014-06-24 DIAGNOSIS — Z87891 Personal history of nicotine dependence: Secondary | ICD-10-CM

## 2014-06-24 DIAGNOSIS — E785 Hyperlipidemia, unspecified: Secondary | ICD-10-CM | POA: Diagnosis present

## 2014-06-24 DIAGNOSIS — Z7982 Long term (current) use of aspirin: Secondary | ICD-10-CM | POA: Diagnosis not present

## 2014-06-24 DIAGNOSIS — Z79891 Long term (current) use of opiate analgesic: Secondary | ICD-10-CM

## 2014-06-24 DIAGNOSIS — R531 Weakness: Secondary | ICD-10-CM

## 2014-06-24 DIAGNOSIS — I10 Essential (primary) hypertension: Secondary | ICD-10-CM | POA: Diagnosis present

## 2014-06-24 DIAGNOSIS — Z809 Family history of malignant neoplasm, unspecified: Secondary | ICD-10-CM | POA: Diagnosis not present

## 2014-06-24 DIAGNOSIS — Z515 Encounter for palliative care: Secondary | ICD-10-CM

## 2014-06-24 DIAGNOSIS — R0789 Other chest pain: Secondary | ICD-10-CM

## 2014-06-24 DIAGNOSIS — R609 Edema, unspecified: Secondary | ICD-10-CM | POA: Diagnosis present

## 2014-06-24 DIAGNOSIS — D638 Anemia in other chronic diseases classified elsewhere: Secondary | ICD-10-CM | POA: Diagnosis present

## 2014-06-24 LAB — CBC WITH DIFFERENTIAL/PLATELET
BASO%: 1.2 % (ref 0.0–2.0)
BASOS ABS: 0.1 10*3/uL (ref 0.0–0.1)
EOS%: 3.5 % (ref 0.0–7.0)
Eosinophils Absolute: 0.3 10*3/uL (ref 0.0–0.5)
HCT: 34.9 % (ref 34.8–46.6)
HEMOGLOBIN: 11.5 g/dL — AB (ref 11.6–15.9)
LYMPH#: 1.5 10*3/uL (ref 0.9–3.3)
LYMPH%: 15.9 % (ref 14.0–49.7)
MCH: 25.5 pg (ref 25.1–34.0)
MCHC: 33 g/dL (ref 31.5–36.0)
MCV: 77.2 fL — ABNORMAL LOW (ref 79.5–101.0)
MONO#: 1 10*3/uL — AB (ref 0.1–0.9)
MONO%: 10.4 % (ref 0.0–14.0)
NEUT#: 6.7 10*3/uL — ABNORMAL HIGH (ref 1.5–6.5)
NEUT%: 69 % (ref 38.4–76.8)
Platelets: 390 10*3/uL (ref 145–400)
RBC: 4.52 10*6/uL (ref 3.70–5.45)
RDW: 17.9 % — ABNORMAL HIGH (ref 11.2–14.5)
WBC: 9.7 10*3/uL (ref 3.9–10.3)

## 2014-06-24 LAB — CBC
HCT: 33.2 % — ABNORMAL LOW (ref 36.0–46.0)
Hemoglobin: 11.5 g/dL — ABNORMAL LOW (ref 12.0–15.0)
MCH: 25.9 pg — AB (ref 26.0–34.0)
MCHC: 34.6 g/dL (ref 30.0–36.0)
MCV: 74.8 fL — ABNORMAL LOW (ref 78.0–100.0)
Platelets: 403 10*3/uL — ABNORMAL HIGH (ref 150–400)
RBC: 4.44 MIL/uL (ref 3.87–5.11)
RDW: 17.1 % — ABNORMAL HIGH (ref 11.5–15.5)
WBC: 9.9 10*3/uL (ref 4.0–10.5)

## 2014-06-24 LAB — PHOSPHORUS: Phosphorus: 2.9 mg/dL (ref 2.3–4.6)

## 2014-06-24 LAB — COMPREHENSIVE METABOLIC PANEL (CC13)
ALK PHOS: 107 U/L (ref 40–150)
ALT: 31 U/L (ref 0–55)
ANION GAP: 15 meq/L — AB (ref 3–11)
AST: 13 U/L (ref 5–34)
Albumin: 2.8 g/dL — ABNORMAL LOW (ref 3.5–5.0)
BILIRUBIN TOTAL: 0.82 mg/dL (ref 0.20–1.20)
BUN: 7.3 mg/dL (ref 7.0–26.0)
CO2: 26 mEq/L (ref 22–29)
CREATININE: 0.7 mg/dL (ref 0.6–1.1)
Calcium: 9.1 mg/dL (ref 8.4–10.4)
Chloride: 101 mEq/L (ref 98–109)
EGFR: >90 mL/min/{1.73_m2} (ref >90)
GLUCOSE: 90 mg/dL (ref 70–140)
Potassium: 3.1 mEq/L — ABNORMAL LOW (ref 3.5–5.1)
Sodium: 143 mEq/L (ref 136–145)
Total Protein: 6.7 g/dL (ref 6.4–8.3)

## 2014-06-24 LAB — COMPREHENSIVE METABOLIC PANEL
ALT: 29 U/L (ref 0–35)
ANION GAP: 10 (ref 5–15)
AST: 16 U/L (ref 0–37)
Albumin: 3.1 g/dL — ABNORMAL LOW (ref 3.5–5.2)
Alkaline Phosphatase: 110 U/L (ref 39–117)
BUN: 10 mg/dL (ref 6–23)
CHLORIDE: 100 mmol/L (ref 96–112)
CO2: 30 mmol/L (ref 19–32)
Calcium: 9.1 mg/dL (ref 8.4–10.5)
Creatinine, Ser: 0.85 mg/dL (ref 0.50–1.10)
GFR calc Af Amer: 77 mL/min — ABNORMAL LOW (ref >90)
GFR calc non Af Amer: 66 mL/min — ABNORMAL LOW (ref >90)
Glucose, Bld: 159 mg/dL — ABNORMAL HIGH (ref 70–99)
Potassium: 3.8 mmol/L (ref 3.5–5.1)
Sodium: 140 mmol/L (ref 135–145)
TOTAL PROTEIN: 7.3 g/dL (ref 6.0–8.3)
Total Bilirubin: 1.2 mg/dL (ref 0.3–1.2)

## 2014-06-24 LAB — PROTIME-INR
INR: 1.18 (ref 0.00–1.49)
Prothrombin Time: 15.1 seconds (ref 11.6–15.2)

## 2014-06-24 LAB — MAGNESIUM: Magnesium: 2.1 mg/dL (ref 1.5–2.5)

## 2014-06-24 LAB — APTT: aPTT: 37 seconds (ref 24–37)

## 2014-06-24 MED ORDER — HYDROCODONE-ACETAMINOPHEN 5-325 MG PO TABS: 1.0000 | ORAL_TABLET | ORAL | Status: DC | PRN

## 2014-06-24 MED ORDER — DEXAMETHASONE SODIUM PHOSPHATE 20 MG/5ML IJ SOLN
16.0000 mg | Freq: Once | INTRAMUSCULAR | Status: AC
  Administered 2014-06-24: 16 mg via INTRAVENOUS

## 2014-06-24 MED ORDER — PEMETREXED DISODIUM CHEMO INJECTION 500 MG
500.0000 mg/m2 | Freq: Once | INTRAVENOUS | Status: AC
  Administered 2014-06-24: 1050 mg via INTRAVENOUS
  Filled 2014-06-24: qty 42

## 2014-06-24 MED ORDER — HYDROCHLOROTHIAZIDE 25 MG PO TABS
25.0000 mg | ORAL_TABLET | Freq: Every morning | ORAL | Status: DC
  Administered 2014-06-25: 25 mg via ORAL
  Filled 2014-06-24 (×4): qty 1

## 2014-06-24 MED ORDER — POTASSIUM CHLORIDE CRYS ER 20 MEQ PO TBCR: 40.0000 meq | EXTENDED_RELEASE_TABLET | Freq: Once | ORAL | Status: DC

## 2014-06-24 MED ORDER — ENOXAPARIN SODIUM 100 MG/ML ~~LOC~~ SOLN
90.0000 mg | Freq: Once | SUBCUTANEOUS | Status: AC
  Administered 2014-06-24: 90 mg via SUBCUTANEOUS
  Filled 2014-06-24: qty 1

## 2014-06-24 MED ORDER — POTASSIUM CHLORIDE 25 MEQ PO PACK
25.0000 meq | PACK | Freq: Two times a day (BID) | ORAL | Status: DC
Start: 2014-06-24 — End: 2014-06-27

## 2014-06-24 MED ORDER — ONDANSETRON HCL 4 MG PO TABS
4.0000 mg | ORAL_TABLET | Freq: Four times a day (QID) | ORAL | Status: DC | PRN
Start: 2014-06-24 — End: 2014-06-27

## 2014-06-24 MED ORDER — FOLIC ACID 1 MG PO TABS
1.0000 mg | ORAL_TABLET | Freq: Every day | ORAL | Status: DC
  Administered 2014-06-24 – 2014-06-27 (×4): 1 mg via ORAL
  Filled 2014-06-24 (×6): qty 1

## 2014-06-24 MED ORDER — ASPIRIN 81 MG PO CHEW
81.0000 mg | CHEWABLE_TABLET | Freq: Every day | ORAL | Status: DC
  Administered 2014-06-24 – 2014-06-27 (×4): 81 mg via ORAL
  Filled 2014-06-24 (×6): qty 1

## 2014-06-24 MED ORDER — POTASSIUM CHLORIDE CRYS ER 20 MEQ PO TBCR
20.0000 meq | EXTENDED_RELEASE_TABLET | Freq: Every day | ORAL | Status: DC
  Administered 2014-06-25 – 2014-06-26 (×2): 20 meq via ORAL
  Filled 2014-06-24 (×2): qty 1

## 2014-06-24 MED ORDER — CARBOPLATIN CHEMO INJECTION 600 MG/60ML
475.0000 mg | Freq: Once | INTRAVENOUS | Status: AC
  Administered 2014-06-24: 480 mg via INTRAVENOUS
  Filled 2014-06-24: qty 48

## 2014-06-24 MED ORDER — ENOXAPARIN SODIUM 40 MG/0.4ML ~~LOC~~ SOLN
40.0000 mg | SUBCUTANEOUS | Status: DC
  Filled 2014-06-24: qty 0.4

## 2014-06-24 MED ORDER — DEXAMETHASONE SODIUM PHOSPHATE 20 MG/5ML IJ SOLN
INTRAMUSCULAR | Status: AC
  Filled 2014-06-24: qty 5

## 2014-06-24 MED ORDER — ONDANSETRON 16 MG/50ML IVPB (CHCC)
INTRAVENOUS | Status: AC
  Filled 2014-06-24: qty 16

## 2014-06-24 MED ORDER — ENOXAPARIN SODIUM 100 MG/ML ~~LOC~~ SOLN
90.0000 mg | Freq: Two times a day (BID) | SUBCUTANEOUS | Status: DC
  Administered 2014-06-24 – 2014-06-25 (×2): 90 mg via SUBCUTANEOUS
  Filled 2014-06-24 (×5): qty 1

## 2014-06-24 MED ORDER — ONDANSETRON 16 MG/50ML IVPB (CHCC)
16.0000 mg | Freq: Once | INTRAVENOUS | Status: AC
  Administered 2014-06-24: 16 mg via INTRAVENOUS

## 2014-06-24 MED ORDER — OXYCODONE-ACETAMINOPHEN 5-325 MG PO TABS: 1.0000 | ORAL_TABLET | Freq: Four times a day (QID) | ORAL | Status: DC | PRN

## 2014-06-24 MED ORDER — ONDANSETRON HCL 8 MG PO TABS: 8.0000 mg | ORAL_TABLET | Freq: Three times a day (TID) | ORAL | Status: DC | PRN

## 2014-06-24 MED ORDER — SODIUM CHLORIDE 0.9 % IV SOLN
Freq: Once | INTRAVENOUS | Status: AC
  Administered 2014-06-24: 10:00:00 via INTRAVENOUS

## 2014-06-24 MED ORDER — LEVETIRACETAM 500 MG PO TABS
1000.0000 mg | ORAL_TABLET | Freq: Two times a day (BID) | ORAL | Status: DC
  Administered 2014-06-24 – 2014-06-27 (×6): 1000 mg via ORAL
  Filled 2014-06-24 (×9): qty 2

## 2014-06-24 MED ORDER — ATORVASTATIN CALCIUM 10 MG PO TABS
10.0000 mg | ORAL_TABLET | Freq: Every day | ORAL | Status: DC
  Administered 2014-06-24 – 2014-06-26 (×3): 10 mg via ORAL
  Filled 2014-06-24 (×6): qty 1

## 2014-06-24 MED ORDER — VITAMIN D3 25 MCG (1000 UNIT) PO TABS
1000.0000 [IU] | ORAL_TABLET | Freq: Every day | ORAL | Status: DC
  Administered 2014-06-24 – 2014-06-26 (×3): 1000 [IU] via ORAL
  Filled 2014-06-24 (×6): qty 1

## 2014-06-24 MED ORDER — ONDANSETRON HCL 4 MG/2ML IJ SOLN: 4.0000 mg | Freq: Four times a day (QID) | INTRAMUSCULAR | Status: DC | PRN

## 2014-06-24 NOTE — H&P (Signed)
[facility] History and Physical  Michelle Cobb OVF:[id number] DOB: 1940-07-26 DOA: 06/24/2014  Referring physician: ED physician PCP: Woody Seller, MD   Chief Complaint: bilateral DVT   HPI:  Pt is 74 yo female with LLL lung adenocarcinoma, T3N2M1b, stage IV, with metastases to brain s/p SBRT 05/09/2014 (follows with Dr. [name]), mets to lungs and liver (per Dr. Burr Medico, tumor incurable at this stage with poor prognosis, goal of treatment palliative, plan for carboplatin and pemetrexed next week as pt with good performance status), presented to Dr. Burr Medico office earlier today prior to this admission with worsening bilateral LE edema LLE > RLE and doppler US c/w acute bilateral DVT. Oxygen saturation noted to be 92% at rest and dropped to 83% with ambulation, raising concern for PE as well. [facility] asked to admit pt directly to medical unit for further evaluation. Pt reports feeling poorly overall over the past week but is not very specific with symptoms, denies chest pain or shortness of breath, denies abd or urinary concerns. She denies any specific focal neurological symptoms. Pt is rather poor historian and keeps repeating she does not want to give her self shots and wants to discuss this with her oncologist.   Assessment and Plan: Active Problems: Acute bilateral LE DVT - placed on Lovenox but not sure if pt will be willing to do this herself at home - she wants to discuss with her oncologist other options - in addition, given exertional hypoxia there is a reasonable concern for PE but pt somewhat reluctant on further testing - will need to d/w Dr Burr Medico if CT chest angio needed as treatment is not going to change  Exertional hypoxia  - pt denies dyspnea at this time - will consider CT angio chest if Dr. Burr Medico agrees and of course if pt agreeable  LLL lung adenocarcinoma, T3N2M1b, stage IV, with metastases to brain, lungs, liver  - management per oncology - continue Keppra for  seizure prevention  HTN - continue HCTZ Hypokalemia - continue to supplement HLD  - continue statin  Anemia of chronic disease, malignancy, microcytic  - Hg stable  - no signs of bleeding - repeat CBC in AM  Radiological Exams on Admission: No results found.   Code Status: Full Family Communication: Pt at bedside Disposition Plan: Admit for further evaluation    [facility] [phone]   Review of Systems:  Constitutional: Negative for fever, chills. Negative for diaphoresis.  HENT: Negative for hearing loss, ear pain, neck pain, tinnitus and ear discharge.   Eyes: Negative for blurred vision, double vision, photphobia, pain, discharge and redness.  Respiratory: Negative for cough, hemoptysis, wheezing and stridor.   Cardiovascular: Negative for chest pain, palpitations, orthopnea.  Gastrointestinal:Negative for heartburn, constipation, blood in stool and melena.  Genitourinary: Negative for dysuria, urgency, frequency, hematuria and flank pain.  Musculoskeletal: Negative for myalgias, back pain.  Skin: Negative for itching and rash.  Neurological: Negative for dizziness and weakness. Endo/Heme/Allergies: Negative for environmental allergies and polydipsia. Does not bruise/bleed easily.  Psychiatric/Behavioral: Negative for suicidal ideas. The patient is not nervous/anxious.      Past Medical History  Diagnosis Date  . Hypertension   . Hyperlipidemia   . Knee injury 1983    left  . Lung cancer   . Brain cancer     Past Surgical History  Procedure Laterality Date  . Video bronchoscopy Bilateral 04/19/2014    Procedure: VIDEO BRONCHOSCOPY WITHOUT FLUORO;  Surgeon: Rigoberto Noel, MD;  [facility]  ENDOSCOPY;  Service: Cardiopulmonary;  Laterality: Bilateral;  . Abdominal hysterectomy  1983    Social History:  reports that she has quit smoking. She uses smokeless tobacco. She reports that she drinks alcohol. She reports that she does not use illicit drugs.  No  Known Allergies  Family History  Problem Relation Age of Onset  . Cancer Sister 43    unknown detail     Prior to Admission medications   Medication Sig Start Date End Date Taking? Authorizing Provider  aspirin 81 MG tablet Take 81 mg by mouth daily.    Historical Provider, MD  atorvastatin (LIPITOR) 10 MG tablet Take 10 mg by mouth daily. 02/18/14   Historical Provider, MD  cholecalciferol (VITAMIN D) 1000 UNITS tablet Take 1,000 Units by mouth daily.    Historical Provider, MD  folic acid (FOLVITE) 1 MG tablet Take 1 tablet (1 mg total) by mouth daily. Start 5-7 days before Alimta chemotherapy. Continue until 21 days after Alimta completed. 05/23/14   Truitt Merle, MD  hydrochlorothiazide (HYDRODIURIL) 25 MG tablet Take 25 mg by mouth every morning.     Historical Provider, MD  levETIRAcetam (KEPPRA) 1000 MG tablet Take 1 tablet (1,000 mg total) by mouth 2 (two) times daily. 04/20/14   Nita Sells, MD  ondansetron (ZOFRAN) 8 MG tablet Take 1 tablet (8 mg total) by mouth every 8 (eight) hours as needed for nausea or vomiting. Patient not taking: Reported on 06/19/2014 05/23/14   Truitt Merle, MD  oxyCODONE-acetaminophen (PERCOCET/ROXICET) 5-325 MG per tablet Take 1-2 tablets by mouth every 6 (six) hours as needed for severe pain. Patient not taking: Reported on 06/24/2014 06/19/14   Lora Paula, MD  Potassium Chloride 25 MEQ PACK Take 25 mEq by mouth 2 (two) times daily. 06/24/14   Truitt Merle, MD  potassium chloride SA (K-DUR,KLOR-CON) 20 MEQ tablet Take 1 tablet (20 mEq total) by mouth daily. Take 3 tablets today then take 1 tablet two times a day for 10 days. 06/19/14   Truitt Merle, MD    Physical Exam: There were no vitals filed for this visit.  Physical Exam  Constitutional: Appears well-developed and well-nourished. No distress.  HENT: Normocephalic. External right and left ear normal. Dry MM Eyes: Conjunctivae and EOM are normal. PERRLA, no scleral icterus.  Neck: Normal ROM. Neck  supple. No JVD. No tracheal deviation. No thyromegaly.  CVS: RRR, S1/S2 +, no gallops, no carotid bruit.  Pulmonary: Effort and breath sounds normal, no stridor, diminished breath sounds at bases  Abdominal: Soft. BS +,  no distension, tenderness, rebound or guarding.  Musculoskeletal: Normal range of motion. Bilateral LE edema with TTP  Lymphadenopathy: No lymphadenopathy noted, cervical, inguinal. Neuro: Alert. Normal reflexes, muscle tone coordination. No cranial nerve deficit. Skin: Skin is warm and dry. No rash noted. Not diaphoretic. No erythema. No pallor.  Psychiatric: Normal mood and affect. Behavior, judgment, thought content normal.   Labs on Admission:  Basic Metabolic Panel:  Recent Labs Lab 06/19/14 1015 06/24/14 0819  NA 141 143  K 2.8* 3.1*  CO2 29 26  GLUCOSE 92 90  BUN 8.7 7.3  CREATININE 0.7 0.7  CALCIUM 9.1 9.1   Liver Function Tests:  Recent Labs Lab 06/19/14 1015 06/24/14 0819  AST 20 13  ALT 100* 31  ALKPHOS 189* 107  BILITOT 1.03 0.82  PROT 6.9 6.7  ALBUMIN 2.8* 2.8*   CBC:  Recent Labs Lab 06/19/14 1014 06/24/14 0819  WBC 9.0 9.7  NEUTROABS 6.1 6.7*  HGB 11.8 11.5*  HCT 33.7* 34.9  MCV 74.6* 77.2*  PLT 298 390    EKG: Normal sinus rhythm, no ST/T wave changes   If 7PM-7AM, please contact night-coverage [email or web address] Password [email or web address] 06/24/2014, 4:00 PM

## 2014-06-24 NOTE — Progress Notes (Signed)
*  Preliminary Results* Bilateral lower extremity venous duplex completed. Bilateral lower extremities are positive for deep vein thrombosis involving the right peroneal, left femoral, left popliteal, left gastrocnemius, left posterior tibial, and left peroneal veins There is no evidence of Baker's cyst bilaterally.  Preliminary results discussed with Mateo Flow, triage RN at cancer center. The patient is instructed to return to the cancer center for treatment.  06/24/2014  Maudry Mayhew, RVT, RDCS, RDMS

## 2014-06-24 NOTE — Telephone Encounter (Signed)
Called Michelle Cobb/Admitting & set up for admission today.  Received message from [facility] @ 2:40PM stating pt can come to admitting & will go to 1330.  Escort took pt to admitting by w/c.

## 2014-06-24 NOTE — Progress Notes (Signed)
ANTICOAGULATION CONSULT NOTE - Initial Consult  Pharmacy Consult for Lovenox Indication: DVT  No Known Allergies  Patient Measurements:   Height: 69 inches Weight: 88.9 kg  Vital Signs: Temp: 99 F (37.2 C) (02/01 0848) Temp Source: Oral (02/01 0848) BP: 115/72 mmHg (02/01 0848) Pulse Rate: 115 (02/01 0848)  Labs:  Recent Labs  06/24/14 0819  HGB 11.5*  HCT 34.9  PLT 390  CREATININE 0.7    Estimated Creatinine Clearance: 74.5 mL/min (by C-G formula based on Cr of 0.7).   Medical History: Past Medical History  Diagnosis Date  . Hypertension   . Hyperlipidemia   . Knee injury 1983    left  . Lung cancer   . Brain cancer     Medications:  Scheduled:  . aspirin  81 mg Oral Daily  . atorvastatin  10 mg Oral Daily  . cholecalciferol  1,000 Units Oral Daily  . enoxaparin (LOVENOX) injection  40 mg Subcutaneous Q24H  . folic acid  1 mg Oral Daily  . [START ON 06/25/2014] hydrochlorothiazide  25 mg Oral q morning - 10a  . levETIRAcetam  1,000 mg Oral BID  . potassium chloride SA  20 mEq Oral Daily   Infusions:   PRN: HYDROcodone-acetaminophen, ondansetron **OR** ondansetron (ZOFRAN) IV, ondansetron, oxyCODONE-acetaminophen  Assessment: 74 yo female with newly-diagnosed metastatic lung cancer presents with bilateral DVTs. Pharmacy is consulted to dose lovenox for treatment.  Goal of Therapy:  Anti-Xa level 0.6-1 units/ml 4hrs after LMWH dose given Monitor platelets by anticoagulation protocol: Yes   Plan:   Lovenox 90mg  SQ q12h  CBC at least [place], PharmD, BCPS Pager: [phone] 06/24/2014,4:11 PM

## 2014-06-24 NOTE — Addendum Note (Signed)
Addended by: Truitt Merle on: 06/24/2014 01:27 PM   Modules accepted: Orders

## 2014-06-24 NOTE — Progress Notes (Signed)
[facility]  [phone] Fax:([phone]) [facility] Note   Patient Care Team: Christain Sacramento, MD as PCP - General (Family Medicine) 06/24/2014  CHIEF COMPLAINTS Follow up  Oncology History   Lung cancer   Staging form: Lung, AJCC 7th Edition     Clinical: Stage IV (T3, N2, M1b) - Signed by Truitt Merle, MD on 05/23/2014       Lung cancer   04/19/2014 Imaging brain MRI showed a hemorrhagic mass 1.9cm in r oarital loibe and a 1.6cm lesion in left front lobe, with significant surrounding edema.    04/25/2014 Initial Diagnosis LLL lung adenocarcinoma, diagnosed through CT guided biopsy, TTF-1(+), CK5/6 (+), with metastases to b/l lungs, brain and liver. She presented wiht left side numbness and possible seizure.    04/30/2014 Imaging PET scan showed a 8cm hypermetabolic LLL lung mass, mediastinal adenopathy, multiple small lung nodules (non hypermetabolic) and a single hypermetabolic liver lesion in segment 3.    05/09/2014 -  Radiation Therapy SBRT to brain lesion    MOLECULAR TESTING ([facility]) KRAS G12C ATRXK 1169 [id number] RICTOR amplification  TP53 A83P Negative for EGFR, ALK, MET, HER2, ROS-1, BRAF etc.   CURRENT THERAPY: Carboplatin AUC 5, pemetrexed $RemoveBefor'500mg'PdAkKwMOLIMz$ /m2, started on 06/03/2014   INTERIM HISTORY She returns for follow up. She tolerated the first cycle chemo well, no significant nausea, vomiting, mild fatigue, still able to function well at home, still able to drive and shopping etc. Her appetite is fair, but some food does not taste good to her, her weight stable. No fever or chills. She has mild left side chest pain, but she does not take any pain meds. She noticed significant worsening of leg welling after taking dexa, and now left leg is bigger than right    MEDICAL HISTORY:  Past Medical History  Diagnosis Date  . Hypertension   . Hyperlipidemia   . Knee injury 1983    left  . Lung cancer   . Brain cancer      SURGICAL HISTORY: Past Surgical History  Procedure Laterality Date  . Video bronchoscopy Bilateral 04/19/2014    Procedure: VIDEO BRONCHOSCOPY WITHOUT FLUORO;  Surgeon: Rigoberto Noel, MD;  Location: [facility];  Service: Cardiopulmonary;  Laterality: Bilateral;  . Abdominal hysterectomy  1983    SOCIAL HISTORY: History   Social History  . Marital Status: Single    Spouse Name: N/A    Number of Children: N/A  . Years of Education: N/A   Occupational History  . Not on file.   Social History Main Topics  . Smoking status: Former Research scientist (life sciences)  . Smokeless tobacco: Current User  . Alcohol Use: Yes     Comment: 2 oz/week  . Drug Use: No  . Sexual Activity: Not Currently   Other Topics Concern  . Not on file   Social History Narrative    FAMILY HISTORY: Family History  Problem Relation Age of Onset  . Cancer Sister 63    unknown detail     ALLERGIES:  has No Known Allergies.  MEDICATIONS:  Current Outpatient Prescriptions  Medication Sig Dispense Refill  . aspirin 81 MG tablet Take 81 mg by mouth daily.    Marland Kitchen atorvastatin (LIPITOR) 10 MG tablet Take 10 mg by mouth daily.  0  . cholecalciferol (VITAMIN D) 1000 UNITS tablet Take 1,000 Units by mouth daily.    . folic acid (FOLVITE) 1 MG tablet Take 1 tablet (1 mg total) by mouth daily.  Start 5-7 days before Alimta chemotherapy. Continue until 21 days after Alimta completed. 100 tablet 3  . hydrochlorothiazide (HYDRODIURIL) 25 MG tablet Take 25 mg by mouth every morning.     . levETIRAcetam (KEPPRA) 1000 MG tablet Take 1 tablet (1,000 mg total) by mouth 2 (two) times daily. 60 tablet 0  . ondansetron (ZOFRAN) 8 MG tablet Take 1 tablet (8 mg total) by mouth every 8 (eight) hours as needed for nausea or vomiting. (Patient not taking: Reported on 06/19/2014) 20 tablet 0  . oxyCODONE-acetaminophen (PERCOCET/ROXICET) 5-325 MG per tablet Take 1-2 tablets by mouth every 6 (six) hours as needed for severe pain. 100 tablet 0  .  potassium chloride SA (K-DUR,KLOR-CON) 20 MEQ tablet Take 1 tablet (20 mEq total) by mouth daily. Take 3 tablets today then take 1 tablet two times a day for 10 days. 23 tablet 0   No current facility-administered medications for this visit.    REVIEW OF SYSTEMS:   Constitutional: Denies fevers, chills or abnormal night sweats Eyes: Denies blurriness of vision, double vision or watery eyes Ears, nose, mouth, throat, and face: Denies mucositis or sore throat Respiratory: Denies cough, dyspnea or wheezes, (+) left side chest pain  Cardiovascular: Denies palpitation, chest discomfort or lower extremity swelling Gastrointestinal:  Denies nausea, heartburn or change in bowel habits Skin: Denies abnormal skin rashes Lymphatics: Denies new lymphadenopathy or easy bruising Neurological: see HPI  Behavioral/Psych: Mood is stable, no new changes  All other systems were reviewed with the patient and are negative.  PHYSICAL EXAMINATION: ECOG PERFORMANCE STATUS: 1 - Symptomatic but completely ambulatory  Filed Vitals:   06/24/14 0848  BP: 115/72  Pulse: 115  Temp: 99 F (37.2 C)  Resp: 18   Filed Weights   06/24/14 0848  Weight: 195 lb 14.4 oz (88.86 kg)    GENERAL:alert, no distress and comfortable SKIN: skin color, texture, turgor are normal, no rashes or significant lesions EYES: normal, conjunctiva are pink and non-injected, sclera clear OROPHARYNX:no exudate, no erythema and lips, buccal mucosa, and tongue normal  NECK: supple, thyroid normal size, non-tender, without nodularity LYMPH:  no palpable lymphadenopathy in the cervical, axillary or inguinal LUNGS: clear to auscultation and percussion with normal breathing effort, diminished breath sound on left lung base  HEART: regular rate & rhythm and no murmurs and no lower extremity edema ABDOMEN:abdomen soft, non-tender and normal bowel sounds Musculoskeletal:no cyanosis of digits and no clubbing  PSYCH: alert & oriented x 3 with  fluent speech NEURO: no focal motor/sensory deficits Ext: (+) b/l ankle and leg edema, L>R  LABORATORY DATA:  I have reviewed the data as listed CBC Latest Ref Rng 06/24/2014 06/19/2014 06/10/2014  WBC 3.9 - 10.3 10e3/uL 9.7 9.0 6.5  Hemoglobin 11.6 - 15.9 g/dL 11.5(L) 11.8 13.4  Hematocrit 34.8 - 46.6 % 34.9 33.7(L) 38.2  Platelets 145 - 400 10e3/uL 390 298 162    CMP Latest Ref Rng 06/24/2014 06/19/2014 06/10/2014  Glucose 70 - 140 mg/dl 90 92 105  BUN 7.0 - 26.0 mg/dL 7.3 8.7 18.9  Creatinine 0.6 - 1.1 mg/dL 0.7 0.7 1.0  Sodium 136 - 145 mEq/L 143 141 139  Potassium 3.5 - 5.1 mEq/L 3.1(L) 2.8(LL) 3.7  Chloride 96 - 112 mEq/L - - -  CO2 22 - 29 mEq/L 26 29 31(H)  Calcium 8.4 - 10.4 mg/dL 9.1 9.1 9.5  Total Protein 6.4 - 8.3 g/dL 6.7 6.9 6.9  Total Bilirubin 0.20 - 1.20 mg/dL 0.82 1.03 1.42(H)  Alkaline  Phos 40 - 150 U/L 107 189(H) 56  AST 5 - 34 U/L _0 ALT 0 - 55 U/L 31 100(H) 15   PATHOLOGY REPORT 04/25/2014 Lung, needle/core biopsy(ies), LLL - NON SMALL CELL CARCINOMA. Microscopic Comment Immunohistochemistry will be performed and reported as an addendum. (JDP:gt, 04/26/14) ADDENDUM: Immunohistochemistry is performed and the tumor is positive with thyroid transcription factor-1 (TTF-1), and shows focal positivity with cytokeratin 5/6. The immunophenotype is consistent with primary lung adenocarcinoma. (JDP:gt, 04/29/14)    RADIOGRAPHIC STUDIES: I have personally reviewed the radiological images as listed and agreed with the findings in the report.  Mr Jeri Cos Wo Contrast 04/19/2014      IMPRESSION:  1. Hemorrhagic mass lesion in the high right parietal lobe measures 19 x 14 x 17 mm.  2. Enhancing mass lesion in the posterior left frontal lobe white matter measures 15 x 16 x 13 mm.  3. There is significant surrounding vasogenic edema with both lesions. These are most compatible with metastases.  4. Moderate generalized atrophy and diffuse white matter disease. This  likely reflects the sequelae of chronic microvascular ischemia.     PET 04/30/2014 1. 8.0 x 4.7 cm pleural-based left lower lobe mass with satellite nodule in the left lower lobe, left hilar and mediastinal adenopathy (extending into the superior mediastinum), widespread disease in the left pleural space, and solitary hepatic metastasis. Findings are compatible with stage IV lung cancer. 2. Multiple other smaller pulmonary nodules are nonspecific, but unchanged compared to the prior examination. Metastatic disease to the lungs is not excluded, and continued attention on future followup studies is recommended. 3. 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 4. Additional incidental findings, as above.   ASSESSMENT & PLAN:  A 74 year old African-American female with past medical history of hypertension, dyslipidemia, have a smoking, who presents left-sided numbness and possible seizure episodes. A CT of chest reviewed a large left upper lobe lung mass, hilar and mediastinal adenopathy, and 2 brain lesions with significant surrounding edema. 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 CT-guided lung mass biopsy showed TTF-1 positive non-small cell carcinoma, consistent with primary lung adenocarcinoma.  1. 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 the goal of treatment is palliation to prolong her life and preserve her quality of life. -I discussed the foundation one result with her, unfortunately, there is no actionable mutations, and her tumor is KRAS (+)  -She has good performance status and preserved organ function, I recommend her to start chemo next week, based on her  adeno histology, I recommend carboplatin and pemetrexed, every 3 weeks for total of 4-6 cycles. I also discussed the benefit of Avastin, due to her hemorrhagic brain lesion, I would not use at this point. If her brain metastasis response to radiation very well, with minimal residual disease, I may consider adding Avastin in the future. -She tolerated the first cycle well, no significant cytopenia, we'll proceed second cycle today. -Restaging before her third cycle.  2. 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 [name], repeat brain MRI in 3 months after treatment  3. Left chest pain, secondary to pleural based left lower lobe lung mass and pleural disease -It's been mild, she has  pain medication and I encouraged her to take as needed. -We discussed palliative radiation if her pain becomes invariable in future.   4. Bilateral lower extremity edema, left bigger than right -I'll obtain a Doppler of lower extremities to ruled out DVT.  4. HTN -Continue medication   4. Social support, -She lives alone, has one son in [place]. She does have some support from her neighbors. I offered her to talk to our Education officer, museum. She was also seen by palliative medicine nurse practitioner.  Plan -cycle 2 chemo today and cycle 3 in 3 weeks -[place] of b/l leg to rule out DVT today  -restaging CT before next cycle -RTC in 3 weeks  All questions were answered. The patient knows to call the clinic with any problems, questions or concerns.  I spent 25 minutes counseling the patient face to face. The total time spent in the appointment was 30 minutes and more than 50% was on counseling.     Truitt Merle, MD 06/24/2014 9:09 AM

## 2014-06-24 NOTE — Progress Notes (Signed)
Addendum,  Her US showed bilateral DVT. Her oxygen sat 92% at rest, and drops to 83% while ambulatory. PE is possible. Pt denies lovenox injection at home.   I will admit her to [facility] hospital for A/C and transition to coumadin or xarelto, and set up home oxygen. I called hospitalist service.  Truitt Merle

## 2014-06-24 NOTE — Telephone Encounter (Signed)
Michelle from Vascular Lab called stating pt is positive for DVT in bilateral lower extremities.  Sharyn Lull attempted to reach Dr Burr Medico with no answer per given number.  Pt will return to the office.  THIS NOTE WILL BE SENT TO DR Burr Medico AND RN AT DESK FOR FOLLOW UP PER PT IS RETURNING TO THE CLINIC.

## 2014-06-24 NOTE — Patient Instructions (Signed)
[facility] Discharge Instructions for Patients Receiving Chemotherapy  Today you received the following chemotherapy agents Alimta/Carboplatin.  To help prevent nausea and vomiting after your treatment, we encourage you to take your nausea medication as directed.    If you develop nausea and vomiting that is not controlled by your nausea medication, call the clinic.   BELOW ARE SYMPTOMS THAT SHOULD BE REPORTED IMMEDIATELY:  *FEVER GREATER THAN 100.5 F  *CHILLS WITH OR WITHOUT FEVER  NAUSEA AND VOMITING THAT IS NOT CONTROLLED WITH YOUR NAUSEA MEDICATION  *UNUSUAL SHORTNESS OF BREATH  *UNUSUAL BRUISING OR BLEEDING  TENDERNESS IN MOUTH AND THROAT WITH OR WITHOUT PRESENCE OF ULCERS  *URINARY PROBLEMS  *BOWEL PROBLEMS  UNUSUAL RASH Items with * indicate a potential emergency and should be followed up as soon as possible.  Feel free to call the clinic you have any questions or concerns. The clinic phone number is [phone].

## 2014-06-24 NOTE — Telephone Encounter (Signed)
Received call from Michelle/Vasc Lab stating that pt positive for bilateral DVT.  Requested that she send pt back to the [facility] have her let us know when she arrives. Reported to Dr Burr Medico.

## 2014-06-24 NOTE — Consult Note (Signed)
Patient Michelle Cobb      DOB: 02-13-1941      UXL:[id number]     Consult Note from the Palliative Medicine Team at [facility] Requested by: Dr [name]     PCP: Woody Seller, MD Reason for Consultation:  Introduction of Pallaitive Medcine             Phone                                                                                                                                [phone]  Assessment of patients Current state:   Patient is processing and working through living (physical and emotional) life with the  serious illness of metastatic lung cancer.  I meet her in the infusion room at request of Dr [name]  Consult is for introduction to the concept of Palliative Medicine, clarification of Advanced Directives,  holistic support and symptom management as indicated  This NP Wadie Lessen reviewed medical records, received report from team, assessed the patient and then meet with Ashok Croon in the cancer center clinic. Concept of Palliative Medicine  was discussed    Values and goals of care important to patient were attempted to be elicited.  Patient very clearly verbalized that she had no desire or  Intention speaking about her disease, treatments, prognosis or anticipatory care needs.  She has little social support  I keep today's visit brief and social in nature.  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   Patient encouraged to call with questions or concerns.  PMT will continue to support holistically.     Goals of Care:  1.  Code Status:  Full code   2. Scope of Treatment:  At this time patient is open to all available  and offered medcial interventions to prolong life, "I'm going to fight and  beat this cancer"    3. Psychosocial:  Emotional support offered to patient   4. Spiritual:  Declined  chaplain support at this time    Brief HPI: 74 yo female with metastatic LLL lung adenocarcinoma T3N2M1b  stage IV with metastasis to brain, liver and lungs, overall poor prognosis  ROS:  Weakness, and fatigue   PMH:  Past Medical History  Diagnosis Date  . Hypertension   . Hyperlipidemia   . Knee injury 1983    left  . Lung cancer   . Brain cancer      PSH: Past Surgical History  Procedure Laterality Date  . Video bronchoscopy Bilateral 04/19/2014    Procedure: VIDEO BRONCHOSCOPY WITHOUT FLUORO;  Surgeon: Rigoberto Noel, MD;  Location: [facility];  Service: Cardiopulmonary;  Laterality: Bilateral;  . Abdominal hysterectomy  1983   I have reviewed the FH and SH and  If appropriate update it with new information. No Known Allergies Scheduled Meds: Continuous Infusions: PRN Meds:.    There were  no vitals taken for this visit.    No intake or output data in the 24 hours ending 06/24/14 1112  Physical Exam:  General: chronically ill appearing Skin: warm and dry   Labs: CBC    Component Value Date/Time   WBC 9.7 06/24/2014 0819   WBC 19.2* 04/25/2014 0718   RBC 4.52 06/24/2014 0819   RBC 6.09* 04/25/2014 0718   HGB 11.5* 06/24/2014 0819   HGB 15.2* 04/25/2014 0718   HCT 34.9 06/24/2014 0819   HCT 43.1 04/25/2014 0718   PLT 390 06/24/2014 0819   PLT 342 04/25/2014 0718   MCV 77.2* 06/24/2014 0819   MCV 70.8* 04/25/2014 0718   MCH 25.5 06/24/2014 0819   MCH 25.0* 04/25/2014 0718   MCHC 33.0 06/24/2014 0819   MCHC 35.3 04/25/2014 0718   RDW 17.9* 06/24/2014 0819   RDW 14.0 04/25/2014 0718   LYMPHSABS 1.5 06/24/2014 0819   LYMPHSABS 2.0 04/18/2014 2023   MONOABS 1.0* 06/24/2014 0819   MONOABS 0.8 04/18/2014 2023   EOSABS 0.3 06/24/2014 0819   EOSABS 0.4 04/18/2014 2023   BASOSABS 0.1 06/24/2014 0819   BASOSABS 0.0 04/18/2014 2023    BMET    Component Value Date/Time   NA 143 06/24/2014 0819   NA 140 04/18/2014 2023   K 3.1* 06/24/2014 0819   K 3.2* 04/18/2014 2023   CL 100 04/18/2014 2023   CO2 26 06/24/2014 0819   CO2 27 04/18/2014 2023    GLUCOSE 90 06/24/2014 0819   GLUCOSE 110* 04/18/2014 2023   BUN 7.3 06/24/2014 0819   BUN 10 04/18/2014 2023   CREATININE 0.7 06/24/2014 0819   CREATININE 0.77 04/18/2014 2023   CALCIUM 9.1 06/24/2014 0819   CALCIUM 8.5 04/18/2014 2023   GFRNONAA 81* 04/18/2014 2023   GFRAA >90 04/18/2014 2023    CMP     Component Value Date/Time   NA 143 06/24/2014 0819   NA 140 04/18/2014 2023   K 3.1* 06/24/2014 0819   K 3.2* 04/18/2014 2023   CL 100 04/18/2014 2023   CO2 26 06/24/2014 0819   CO2 27 04/18/2014 2023   GLUCOSE 90 06/24/2014 0819   GLUCOSE 110* 04/18/2014 2023   BUN 7.3 06/24/2014 0819   BUN 10 04/18/2014 2023   CREATININE 0.7 06/24/2014 0819   CREATININE 0.77 04/18/2014 2023   CALCIUM 9.1 06/24/2014 0819   CALCIUM 8.5 04/18/2014 2023   PROT 6.7 06/24/2014 0819   PROT 7.1 04/18/2014 2023   ALBUMIN 2.8* 06/24/2014 0819   ALBUMIN 3.1* 04/18/2014 2023   AST 13 06/24/2014 0819   AST 14 04/18/2014 2023   ALT 31 06/24/2014 0819   ALT 11 04/18/2014 2023   ALKPHOS 107 06/24/2014 0819   ALKPHOS 73 04/18/2014 2023   BILITOT 0.82 06/24/2014 0819   BILITOT 0.3 04/18/2014 2023   GFRNONAA 81* 04/18/2014 2023   GFRAA >90 04/18/2014 2023   ECOG PERFORMANCE STATUS* (Eastern Cooperative Oncology Group)  0 Fully active, able to continue with all pre-disease activities without restriction. Pt score  1 Restricted in physically strenuous activity but ambulatory and able to carry out work of a light or sedentary nature, e.g., light house work, office work. 1  2 Ambulatory and capable of all self-care but unable to carry out any work activities. Up and about more than 50% of waking hours.    3 Capable of only limited self-care. Confined to bed or chair more than 50% of waking hours.   4 Completely disabled. Cannot  carry on any self-care. Totally confined to bed or chair.   5 Dead.    As published in Am. J. Clin. Oncol.: Eustace Pen, M.M., Colon Flattery., [place], D.C., Horton, Sharen Hint.,  Drexel Iha, P.P.: Toxicity And Response Criteria Of [facility]. [place] 6:803-212, 1982.  The ECOG Performance Status is in the public domain therefore available for public use. To duplicate the scale, please cite the reference above and credit the [facility], Tyler Pita M.D., Group Chair    Time In Time Out Total Time Spent with Patient Total Overall Time  0930 1015 40 min 45 min    Greater than 50%  of this time was spent counseling and coordinating care related to the above assessment and plan.   Wadie Lessen NP  Palliative Medicine Team Team Phone # [phone] Pager [phone]  Discussed with Joaquim Lai RN with Dr [name]

## 2014-06-25 DIAGNOSIS — E876 Hypokalemia: Secondary | ICD-10-CM | POA: Diagnosis present

## 2014-06-25 DIAGNOSIS — J9601 Acute respiratory failure with hypoxia: Secondary | ICD-10-CM

## 2014-06-25 DIAGNOSIS — D638 Anemia in other chronic diseases classified elsewhere: Secondary | ICD-10-CM

## 2014-06-25 DIAGNOSIS — I10 Essential (primary) hypertension: Secondary | ICD-10-CM

## 2014-06-25 DIAGNOSIS — E785 Hyperlipidemia, unspecified: Secondary | ICD-10-CM

## 2014-06-25 DIAGNOSIS — C7931 Secondary malignant neoplasm of brain: Secondary | ICD-10-CM

## 2014-06-25 LAB — CBC
HCT: 30.3 % — ABNORMAL LOW (ref 36.0–46.0)
HEMOGLOBIN: 10.3 g/dL — AB (ref 12.0–15.0)
MCH: 25.6 pg — AB (ref 26.0–34.0)
MCHC: 34 g/dL (ref 30.0–36.0)
MCV: 75.2 fL — AB (ref 78.0–100.0)
PLATELETS: 374 10*3/uL (ref 150–400)
RBC: 4.03 MIL/uL (ref 3.87–5.11)
RDW: 17 % — ABNORMAL HIGH (ref 11.5–15.5)
WBC: 9.9 10*3/uL (ref 4.0–10.5)

## 2014-06-25 LAB — BASIC METABOLIC PANEL
Anion gap: 9 (ref 5–15)
BUN: 12 mg/dL (ref 6–23)
CALCIUM: 8.4 mg/dL (ref 8.4–10.5)
CO2: 30 mmol/L (ref 19–32)
Chloride: 100 mmol/L (ref 96–112)
Creatinine, Ser: 0.72 mg/dL (ref 0.50–1.10)
GFR calc Af Amer: >90 mL/min (ref >90)
GFR calc non Af Amer: 83 mL/min — ABNORMAL LOW (ref >90)
GLUCOSE: 126 mg/dL — AB (ref 70–99)
POTASSIUM: 3.2 mmol/L — AB (ref 3.5–5.1)
Sodium: 139 mmol/L (ref 135–145)

## 2014-06-25 MED ORDER — ENOXAPARIN SODIUM 150 MG/ML ~~LOC~~ SOLN
1.5000 mg/kg | SUBCUTANEOUS | Status: DC
  Administered 2014-06-25 – 2014-06-26 (×2): 135 mg via SUBCUTANEOUS
  Filled 2014-06-25 (×3): qty 1

## 2014-06-25 MED ORDER — ENOXAPARIN (LOVENOX) PATIENT EDUCATION KIT
PACK | Freq: Once | Status: AC
  Administered 2014-06-25: 17:00:00
  Filled 2014-06-25: qty 1

## 2014-06-25 MED ORDER — POLYETHYLENE GLYCOL 3350 17 G PO PACK
17.0000 g | PACK | Freq: Every day | ORAL | Status: DC
  Filled 2014-06-25 (×3): qty 1

## 2014-06-25 NOTE — Progress Notes (Signed)
ANTICOAGULATION CONSULT NOTE - follow up  Pharmacy Consult for Lovenox Indication: DVT  No Known Allergies  Patient Measurements:   Height: 69 inches Weight: 88.9 kg  Vital Signs: Temp: 97.7 F (36.5 C) (02/02 0545) Temp Source: Oral (02/02 0545) BP: 108/80 mmHg (02/02 0545) Pulse Rate: 83 (02/02 0545)  Labs:  Recent Labs  06/24/14 0819 06/24/14 1625 06/25/14 0418  HGB 11.5* 11.5* 10.3*  HCT 34.9 33.2* 30.3*  PLT 390 403* 374  APTT  --  37  --   LABPROT  --  15.1  --   INR  --  1.18  --   CREATININE 0.7 0.85 0.72    Estimated Creatinine Clearance: 74.5 mL/min (by C-G formula based on Cr of 0.72).   Medical History: Past Medical History  Diagnosis Date  . Hypertension   . Hyperlipidemia   . Knee injury 1983    left  . Lung cancer   . Brain cancer     Medications:  Scheduled:  . aspirin  81 mg Oral Daily  . atorvastatin  10 mg Oral Daily  . cholecalciferol  1,000 Units Oral Daily  . enoxaparin (LOVENOX) injection  90 mg Subcutaneous Q12H  . folic acid  1 mg Oral Daily  . hydrochlorothiazide  25 mg Oral q morning - 10a  . levETIRAcetam  1,000 mg Oral BID  . polyethylene glycol  17 g Oral Daily  . potassium chloride SA  20 mEq Oral Daily   Infusions:   PRN: HYDROcodone-acetaminophen, ondansetron **OR** ondansetron (ZOFRAN) IV, ondansetron, oxyCODONE-acetaminophen  Assessment: 74 yo female with newly-diagnosed metastatic lung cancer presents with bilateral DVTs. Pharmacy is consulted to dose lovenox for treatment. Note that patient with active cancer being treated so warfarin is not recommended at this time. Lovenox is first line therapy per guidelines for at least 6 months or per Dr. Marcene Corning can also be considered.   Goal of Therapy:  Anti-Xa level 0.6-1 units/ml 4hrs after LMWH dose given Monitor platelets by anticoagulation protocol: Yes   Plan:  1. Patient has decided to proceed with Lovenox. Will change Lovenox from 38m/kg q12 to  1.545mkg q24 2. Send patient Lovenox kit. 3. Daily CBC   JuAdrian SaranPharmD, BCPS Pager [phone] 1227/06/2014 9:38 AM

## 2014-06-25 NOTE — Care Management Note (Signed)
CARE MANAGEMENT NOTE 06/25/2014  Patient:  Michelle Cobb, Michelle Cobb   Account Number:  [id number]  Date Initiated:  06/25/2014  Documentation initiated by:  Marney Doctor  Subjective/Objective Assessment:   74 yo admitted with DVT     Action/Plan:   From home alone   Anticipated DC Date:  06/26/2014   Anticipated DC Plan:  [place]  CM consult      Choice offered to / List presented to:             Status of service:  In process, will continue to follow Medicare Important Message given?   (If response is "NO", the following Medicare IM given date fields will be blank) Date Medicare IM given:   Medicare IM given by:   Date Additional Medicare IM given:   Additional Medicare IM given by:    Discharge Disposition:    Per UR Regulation:  Reviewed for med. necessity/level of care/duration of stay  If discussed at [place] of Stay Meetings, dates discussed:    Comments:  06/25/14 Marney Doctor RN,BSN,NCM [phone] Met with Pt to assess DC needs.  Pt lives alone and has good support from neighbors.  Pt states she gets around fine without any equipment.  She states that her PCP told her she may need 02 at night eventually but not at this time (pt not on 02 while we were talking). Pt states she drives her own car and has a pharmacy that she uses and funds to pay for meds. Pt feels she can deliver her own lovenox. No DC needs noted at this time.  CM will continue to follow.

## 2014-06-25 NOTE — Progress Notes (Signed)
Progress Note   Michelle Cobb VZD:[id number] DOB: 28-Jul-1940 DOA: 06/24/2014 PCP: Woody Seller, MD   Brief Narrative:   Michelle Cobb is an 74 y.o. 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 [name]), mets to lungs and liver (per Dr. Burr Medico, tumor incurable at this stage with poor prognosis, goal of treatment palliative, plan for carboplatin and pemetrexed next week as pt with good performance status), presented to Dr. Ernestina Penna office 06/24/14 with worsening bilateral LE edema LLE > RLE and doppler US c/w acute bilateral DVT. Oxygen saturation noted to be 92% at rest and dropped to 83% with ambulation, raising concern for PE as well. [facility] asked to admit pt directly to medical unit for further evaluation.  Assessment/Plan:   Active Problems: Acute bilateral LE DVT  Currently on Lovenox, which she is now willing to learn how to self inject and administer for long-term treatment.  May have a pulmonary embolism, but since this will not change management, no absolute indication for CT angiogram at this time.  Acute respiratory failure with hypoxia  Provide supplemental oxygen if needed.  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  Continue Keppra for seizure prophylaxis.  HTN  Continue hydrochlorothiazide.  Hypokalemia  Continue to supplement. Magnesium okay.  Hyperlipidemia   Continue statin therapy.   Anemia of chronic disease, malignancy, microcytic   Hemoglobin stable with no current indication for transfusion.  Code Status: Full. Family Communication: Pt at bedside. No family present. Disposition Plan:  Likely home 06/26/14.     IV Access:    Peripheral IV   Procedures and diagnostic studies:   Lower extremity venous duplex bilateral 06/24/14: Findings consistent with  acute DVT involving the right peroneal vein, left femoral vein, left popliteal vein, left posterior tibial vein, left peroneal vein and left gastrocnemius vein.  Medical Consultants:    None.  Anti-Infectives:    None.  Subjective:   Michelle Cobb is a bit concerned about her medications being given all at once instead of 1 at a time throughout the day. She now tells me she is willing to give herself Lovenox injections. Denies dyspnea and has been ambulating to the bathroom without difficulty. Tells me she is independent and does not want a home health nurse coming out her house. Ongoing lower extremity swelling noted. No chest pain. No nausea or vomiting.  Objective:    Filed Vitals:   06/24/14 2014 06/25/14 0545  BP: 100/54 108/80  Pulse: 105 83  Temp: 97.7 F (36.5 C) 97.7 F (36.5 C)  TempSrc: Oral Oral  Resp: 18 18  SpO2: 95% 100%    Intake/Output Summary (Last 24 hours) at 06/25/14 2951 Last data filed at 06/24/14 1819  Gross per 24 hour  Intake    240 ml  Output      0 ml  Net    240 ml    Exam: Gen:  NAD, sitting up in the chair Cardiovascular:  RRR, No M/R/G Respiratory:  Lungs CTAB Gastrointestinal:  Abdomen soft, NT/ND, + BS Extremities:   Bilateral lower extremity swelling, left greater than right   Data Reviewed:    Labs: Basic Metabolic Panel:  Recent Labs Lab 06/19/14 1015 06/24/14 0819 06/24/14 1625 06/25/14 0418  NA 141 143 140 139  K 2.8* 3.1* 3.8 3.2*  CL  --   --  100 100  CO2 29 26 30 30   GLUCOSE 92 90 159* 126*  BUN 8.7 7.3 10 12   CREATININE 0.7 0.7 0.85 0.72  CALCIUM 9.1 9.1 9.1 8.4  MG  --   --  2.1  --   PHOS  --   --  2.9  --    GFR Estimated Creatinine Clearance: 74.5 mL/min (by C-G formula based on Cr of 0.72). Liver Function Tests:  Recent Labs Lab 06/19/14 1015 06/24/14 0819 06/24/14 1625  AST 20 13 16   ALT 100* 31 29  ALKPHOS 189* 107 110  BILITOT 1.03 0.82 1.2  PROT 6.9 6.7 7.3  ALBUMIN 2.8* 2.8*  3.1*   Coagulation profile  Recent Labs Lab 06/24/14 1625  INR 1.18    CBC:  Recent Labs Lab 06/19/14 1014 06/24/14 0819 06/24/14 1625 06/25/14 0418  WBC 9.0 9.7 9.9 9.9  NEUTROABS 6.1 6.7*  --   --   HGB 11.8 11.5* 11.5* 10.3*  HCT 33.7* 34.9 33.2* 30.3*  MCV 74.6* 77.2* 74.8* 75.2*  PLT 298 390 403* 374   Microbiology No results found for this or any previous visit (from the past 240 hour(s)).   Medications:   . aspirin  81 mg Oral Daily  . atorvastatin  10 mg Oral Daily  . cholecalciferol  1,000 Units Oral Daily  . enoxaparin (LOVENOX) injection  90 mg Subcutaneous Q12H  . folic acid  1 mg Oral Daily  . hydrochlorothiazide  25 mg Oral q morning - 10a  . levETIRAcetam  1,000 mg Oral BID  . potassium chloride SA  20 mEq Oral Daily   Continuous Infusions:   Time spent:  25 minutes.   LOS: 1 day   RAMA,CHRISTINA  [facility] Pager [phone]. If unable to reach me by pager, please call my cell phone at [phone].  *Please refer to [email or web address], password [email or web address] to get updated schedule on who will round on this patient, as hospitalists switch teams weekly. If 7PM-7AM, please contact night-coverage at [email or web address], password [email or web address] for any overnight needs.  06/25/2014, 7:27 AM

## 2014-06-25 NOTE — Plan of Care (Signed)
Michelle Cobb demonstrated giving herself this evening's Lovenox. She grasped the syringe correctly and injected the medicine in her abdomen without leakage. She appears ready to give herself her Lovenox shots at discharge.

## 2014-06-26 ENCOUNTER — Encounter: Payer: Self-pay | Admitting: Hematology

## 2014-06-26 ENCOUNTER — Other Ambulatory Visit: Payer: Self-pay | Admitting: *Deleted

## 2014-06-26 ENCOUNTER — Telehealth: Payer: Self-pay | Admitting: *Deleted

## 2014-06-26 DIAGNOSIS — C3432 Malignant neoplasm of lower lobe, left bronchus or lung: Secondary | ICD-10-CM | POA: Insufficient documentation

## 2014-06-26 DIAGNOSIS — R0600 Dyspnea, unspecified: Secondary | ICD-10-CM

## 2014-06-26 DIAGNOSIS — R600 Localized edema: Secondary | ICD-10-CM

## 2014-06-26 DIAGNOSIS — I2699 Other pulmonary embolism without acute cor pulmonale: Secondary | ICD-10-CM

## 2014-06-26 DIAGNOSIS — R531 Weakness: Secondary | ICD-10-CM | POA: Insufficient documentation

## 2014-06-26 DIAGNOSIS — I82403 Acute embolism and thrombosis of unspecified deep veins of lower extremity, bilateral: Secondary | ICD-10-CM

## 2014-06-26 DIAGNOSIS — Z515 Encounter for palliative care: Secondary | ICD-10-CM | POA: Insufficient documentation

## 2014-06-26 LAB — CBC
HEMATOCRIT: 30.3 % — AB (ref 36.0–46.0)
HEMOGLOBIN: 10.3 g/dL — AB (ref 12.0–15.0)
MCH: 25.6 pg — ABNORMAL LOW (ref 26.0–34.0)
MCHC: 34 g/dL (ref 30.0–36.0)
MCV: 75.4 fL — ABNORMAL LOW (ref 78.0–100.0)
Platelets: 341 10*3/uL (ref 150–400)
RBC: 4.02 MIL/uL (ref 3.87–5.11)
RDW: 17.3 % — AB (ref 11.5–15.5)
WBC: 8.9 10*3/uL (ref 4.0–10.5)

## 2014-06-26 LAB — BASIC METABOLIC PANEL
Anion gap: 3 — ABNORMAL LOW (ref 5–15)
BUN: 12 mg/dL (ref 6–23)
CO2: 31 mmol/L (ref 19–32)
CREATININE: 0.67 mg/dL (ref 0.50–1.10)
Calcium: 8.2 mg/dL — ABNORMAL LOW (ref 8.4–10.5)
Chloride: 105 mmol/L (ref 96–112)
GFR calc Af Amer: >90 mL/min (ref >90)
GFR calc non Af Amer: 85 mL/min — ABNORMAL LOW (ref >90)
Glucose, Bld: 99 mg/dL (ref 70–99)
Potassium: 3.1 mmol/L — ABNORMAL LOW (ref 3.5–5.1)
Sodium: 139 mmol/L (ref 135–145)

## 2014-06-26 MED ORDER — ENOXAPARIN SODIUM 80 MG/0.8ML ~~LOC~~ SOLN: 80.0000 mg | Freq: Two times a day (BID) | SUBCUTANEOUS | Status: DC

## 2014-06-26 MED ORDER — POTASSIUM CHLORIDE CRYS ER 20 MEQ PO TBCR
40.0000 meq | EXTENDED_RELEASE_TABLET | Freq: Once | ORAL | Status: AC
  Administered 2014-06-26: 40 meq via ORAL
  Filled 2014-06-26: qty 2

## 2014-06-26 NOTE — Progress Notes (Signed)
Michelle Cobb   DOB:[birth year]   [id number]   HYQ#:[id number]  Subjective: She is doing well overall on floor, learned how to do lovenox injection. No bleeding. Still has dyspnea on exertion, leg edema better    Objective:  Filed Vitals:   06/26/14 1005  BP: 103/65  Pulse:   Temp:   Resp:     There is no weight on file to calculate BMI.  Intake/Output Summary (Last 24 hours) at 06/26/14 1236 Last data filed at 06/26/14 9528  Gross per 24 hour  Intake    240 ml  Output      0 ml  Net    240 ml     Sclerae unicteric  Oropharynx clear  No peripheral adenopathy  Lungs clear -- no rales or rhonchi  Heart regular rate and rhythm  Abdomen benign  MSK no focal spinal tenderness, no peripheral edema  Neuro nonfocal   CBG (last 3)  No results for input(s): GLUCAP in the last 72 hours.   Labs:  Lab Results  Component Value Date   WBC 8.9 06/26/2014   HGB 10.3* 06/26/2014   HCT 30.3* 06/26/2014   MCV 75.4* 06/26/2014   PLT 341 06/26/2014   NEUTROABS 6.7* 06/24/2014    @LASTCHEMISTRY @  Urine Studies No results for input(s): UHGB, CRYS in the last 72 hours.  Invalid input(s): UACOL, UAPR, USPG, UPH, UTP, UGL, UKET, UBIL, UNIT, UROB, ULEU, UEPI, UWBC, URBC, UBAC, [place], [place], [place]  Basic Metabolic Panel:  Recent Labs Lab 06/24/14 0819  06/24/14 1625 06/25/14 0418 06/26/14 0435  NA 143  --  140 139 139  K 3.1*  < > 3.8 3.2* 3.1*  CL  --   --  100 100 105  CO2 26  --  30 30 31   GLUCOSE 90  --  159* 126* 99  BUN 7.3  --  10 12 12   CREATININE 0.7  --  0.85 0.72 0.67  CALCIUM 9.1  --  9.1 8.4 8.2*  MG  --   --  2.1  --   --   PHOS  --   --  2.9  --   --   < > = values in this interval not displayed. GFR Estimated Creatinine Clearance: 74.5 mL/min (by C-G formula based on Cr of 0.67). Liver Function Tests:  Recent Labs Lab 06/24/14 0819 06/24/14 1625  AST 13 16  ALT 31 29  ALKPHOS 107 110  BILITOT 0.82 1.2  PROT 6.7 7.3  ALBUMIN 2.8* 3.1*   No  results for input(s): LIPASE, AMYLASE in the last 168 hours. No results for input(s): AMMONIA in the last 168 hours. Coagulation profile  Recent Labs Lab 06/24/14 1625  INR 1.18    CBC:  Recent Labs Lab 06/24/14 0819 06/24/14 1625 06/25/14 0418 06/26/14 0435  WBC 9.7 9.9 9.9 8.9  NEUTROABS 6.7*  --   --   --   HGB 11.5* 11.5* 10.3* 10.3*  HCT 34.9 33.2* 30.3* 30.3*  MCV 77.2* 74.8* 75.2* 75.4*  PLT 390 403* 374 341   Cardiac Enzymes: No results for input(s): CKTOTAL, CKMB, CKMBINDEX, TROPONINI in the last 168 hours. BNP: Invalid input(s): POCBNP CBG: No results for input(s): GLUCAP in the last 168 hours. D-Dimer No results for input(s): DDIMER in the last 72 hours. Hgb A1c No results for input(s): HGBA1C in the last 72 hours. Lipid Profile No results for input(s): CHOL, HDL, LDLCALC, TRIG, CHOLHDL, LDLDIRECT in the last 72 hours. Thyroid function studies  No results for input(s): TSH, T4TOTAL, T3FREE, THYROIDAB in the last 72 hours.  Invalid input(s): FREET3 Anemia work up No results for input(s): VITAMINB12, FOLATE, FERRITIN, TIBC, IRON, RETICCTPCT in the last 72 hours. Microbiology No results found for this or any previous visit (from the past 240 hour(s)).    Studies:  No results found.  Assessment: 74 y.o. with stage IV lung adenocarcinoma with brain mets, s/p brain radiaiton and 2 cycles chemo  1. B/L DVT, possible PE -She is OK to do lovenox at home, checked her insurance, $100 copay for 80mg  bid one month supply. She is OK with the copay -will likely transition to coumadin in one month. Xarelto is not covered by her insurance -dicussed the risk of bleeding including intracranial bleeding,  she knows to call  -Please set up home oxygen if needed -I anticipate she can be discharged home tomorrow   2. Stage IV lung cancer -R/U with me before next cycle chemo with restaging CT  Thanks.  Truitt Merle, MD 06/26/2014  12:36 PM

## 2014-06-26 NOTE — Addendum Note (Signed)
Addended by: Truitt Merle on: 06/26/2014 10:02 AM   Modules accepted: Orders

## 2014-06-26 NOTE — Progress Notes (Signed)
[facility] PROGRESS NOTE  Michelle Cobb DJM:[id number] DOB: [birth year] DOA: 06/24/2014 PCP: Woody Seller, MD  Assessment/Plan: Principal Problem:   DVT (deep venous thrombosis) - Currently on lovenox injections which patient is tolerating - hematologist on board  Active Problems:   Hyperlipidemia - Stable patient currently on statin.    Brain metastasis/ Lung cancer -Patient to follow-up with oncologist after discharge    Acute respiratory failure with hypoxia - Suspect patient most likely has PE as well but currently is on Lovenox - Prior to discharge will have nursing assess patient pulse ox on air and with ambulation to see whether or not patient will require home oxygen    Essential hypertension -Blood pressure low normal as such will discontinue hydrochlorothiazide    Hypokalemia - Will replace orally and reassess - Magnesium levels within normal limits    Anemia of chronic disease -Stable hemoglobin steady at 10   Code Status: Full Family Communication: No family at bedside Disposition Plan: Most likely discharged next a.m.   Consultants:  Oncology  Procedures:  None  Antibiotics:  None  HPI/Subjective: The patient has no new complaints. No acute issues overnight. States with activity she feels short of breath  Objective: Filed Vitals:   06/26/14 1326  BP: 127/73  Pulse: 111  Temp: 100.1 F (37.8 C)  Resp: 16    Intake/Output Summary (Last 24 hours) at 06/26/14 1431 Last data filed at 06/26/14 1240  Gross per 24 hour  Intake    480 ml  Output      0 ml  Net    480 ml   Filed Weights   06/26/14 1326  Weight: 88.451 kg (195 lb)    Exam:   General:  Patient in no acute distress, alert and awake  Cardiovascular: Regular rate and rhythm, no murmurs or rubs  Respiratory: Breathing comfortably on room air, equal chest rise, no wheezes  Abdomen: Soft, nondistended, nontender  Musculoskeletal: No clubbing, no calf  tenderness   Data Reviewed: Basic Metabolic Panel:  Recent Labs Lab 06/24/14 0819 06/24/14 1625 06/25/14 0418 06/26/14 0435  NA 143 140 139 139  K 3.1* 3.8 3.2* 3.1*  CL  --  100 100 105  CO2 26 30 30 31   GLUCOSE 90 159* 126* 99  BUN 7.3 10 12 12   CREATININE 0.7 0.85 0.72 0.67  CALCIUM 9.1 9.1 8.4 8.2*  MG  --  2.1  --   --   PHOS  --  2.9  --   --    Liver Function Tests:  Recent Labs Lab 06/24/14 0819 06/24/14 1625  AST 13 16  ALT 31 29  ALKPHOS 107 110  BILITOT 0.82 1.2  PROT 6.7 7.3  ALBUMIN 2.8* 3.1*   No results for input(s): LIPASE, AMYLASE in the last 168 hours. No results for input(s): AMMONIA in the last 168 hours. CBC:  Recent Labs Lab 06/24/14 0819 06/24/14 1625 06/25/14 0418 06/26/14 0435  WBC 9.7 9.9 9.9 8.9  NEUTROABS 6.7*  --   --   --   HGB 11.5* 11.5* 10.3* 10.3*  HCT 34.9 33.2* 30.3* 30.3*  MCV 77.2* 74.8* 75.2* 75.4*  PLT 390 403* 374 341   Cardiac Enzymes: No results for input(s): CKTOTAL, CKMB, CKMBINDEX, TROPONINI in the last 168 hours. BNP (last 3 results) No results for input(s): BNP in the last 8760 hours.  ProBNP (last 3 results) No results for input(s): PROBNP in the last 8760 hours.  CBG: No results for input(s):  GLUCAP in the last 168 hours.  No results found for this or any previous visit (from the past 240 hour(s)).   Studies: No results found.  Scheduled Meds: . aspirin  81 mg Oral Daily  . atorvastatin  10 mg Oral Daily  . cholecalciferol  1,000 Units Oral Daily  . enoxaparin (LOVENOX) injection  1.5 mg/kg Subcutaneous Q24H  . folic acid  1 mg Oral Daily  . levETIRAcetam  1,000 mg Oral BID  . polyethylene glycol  17 g Oral Daily  . potassium chloride SA  20 mEq Oral Daily   Continuous Infusions:    Time spent: > 35 minutes    Velvet Bathe  [facility] Pager [phone]. If 7PM-7AM, please contact night-coverage at [email or web address], password [email or web address] 06/26/2014, 2:31 PM  LOS: 2 days

## 2014-06-26 NOTE — Progress Notes (Signed)
Faxed xarelto pa form to [facility]

## 2014-06-26 NOTE — Telephone Encounter (Signed)
Spoke with Catalina Antigua,  Xarelto representative about pt's situation with Xarelto copay.  Per Catalina Antigua,  Pt will need prescription for Xarelto  Starter Pack - which pt can get for free at [facility]. Pt will also need a  30 day free drug voucher ( care management in the hospital should be able to give to pt at discharge ).  Pt can obtain maintenance dose of Xarelto at the pharmacy for free with the voucher.    Upon follow up with Dr. Burr Medico,  Xarelto refill prescription can be sent to [facility] with the correct diagnosis code for bilateral DVT.  Per [facility], insurance would cover this med as long as it is used for DVT indications. Message relayed to Dr. Burr Medico.

## 2014-06-26 NOTE — Progress Notes (Signed)
[employer] [facility] approved xarelto from 06/26/14-06/27/15

## 2014-06-27 MED ORDER — ENOXAPARIN SODIUM 150 MG/ML ~~LOC~~ SOLN: 1.5000 mg/kg | SUBCUTANEOUS | Status: DC

## 2014-06-27 NOTE — Progress Notes (Signed)
Patient ambulated past the main nursing station from her room and back.  Her o2 at rest was 96 percent.  Her oxygen went from 96 down to 95 and back up to 99 percent upon ambulation.  Patient reports no complaints of dyspnea or shortness of breath.Michelle Cobb

## 2014-06-27 NOTE — Discharge Summary (Signed)
Physician Discharge Summary  Michelle Cobb HGD:[id number] DOB: August 16, 1940 DOA: 06/24/2014  PCP: Michelle Seller, MD  Admit date: 06/24/2014 Discharge date: 06/27/2014  Time spent: > 35 minutes  Recommendations for Outpatient Follow-up:  1.  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 [name]), mets to lungs and liver (per Dr. Burr Medico, tumor incurable at this stage with poor prognosis, goal of treatment palliative, plan for carboplatin and pemetrexed next week as pt with good performance status), presented to Dr. Burr Medico office earlier today prior to this admission with worsening bilateral LE edema LLE > RLE and doppler US c/w acute bilateral DVT. Oxygen saturation noted to be 92% at rest and dropped to 83% with ambulation, raising concern for PE as well. [facility] asked to admit pt directly to medical unit for further evaluation. Pt reports feeling poorly overall over the past week but is not very specific with symptoms, denies chest pain or shortness of breath, denies abd or urinary concerns. She denies any specific focal neurological symptoms. Pt is rather poor historian and keeps repeating she does not want to give her self shots and wants to discuss this with her oncologist  Hospital Course:  DVT (deep venous thrombosis) - Currently on lovenox injections which patient is tolerating - Patient to continue following up with  hematologist after discharge - Suspect patient may have a PE as well although was not confirmed with CT angiogram of chest.   Active Problems:  Hyperlipidemia - Stable patient currently on statin.   Brain metastasis/ Lung cancer -Patient to follow-up with oncologist after discharge   Acute respiratory failure with hypoxia - Suspect patient most likely has PE as well but currently is on Lovenox - Per RN note: Her o2 at rest was 96 percent. Her oxygen went from 96 down to 95 and back up to 99 percent upon ambulation. Patient reports no complaints of dyspnea or shortness of breath.   Essential hypertension -Blood pressure low normal as such will discontinue hydrochlorothiazide   Hypokalemia - Replaced orally on 06/26/14 - Magnesium levels within normal limits - Recommend having levels repeated on post hospital visit   Anemia of chronic disease -Stable hemoglobin steady at 10   Procedures:  None  Consultations:  Hematologist/oncology  Discharge Exam: Filed Vitals:   06/27/14 0631  BP: 117/72  Pulse: 90  Temp: 98.8 F (37.1 C)  Resp: 16    General: Patient in no acute distress, alert and awake Cardiovascular: No cyanosis, no chest pain Respiratory: Breathing comfortably on room air, speaking in full sentences, equal chest rise, no audible wheezes  Discharge Instructions   Discharge Instructions    Call MD for:  extreme fatigue    Complete by:  As directed      Call MD for:  redness, tenderness, or signs of infection (pain, swelling, redness, odor or green/yellow discharge around incision site)    Complete by:  As  directed      Call MD for:  temperature >100.4    Complete by:  As directed      Diet - low sodium heart healthy    Complete by:  As directed      Discharge instructions    Complete by:  As directed   Please be sure to follow-up with your oncologist for further evaluation recommendations. Also continue your daily Lovenox at 135 mg subcutaneous  every 24 hours.     Increase activity slowly    Complete by:  As directed           Current Discharge Medication List    START taking these medications   Details  enoxaparin (LOVENOX) 150 MG/ML injection Inject 0.89 mLs (135 mg total) into the skin daily. Qty: 1 mL, Refills: 0      CONTINUE these medications which have NOT CHANGED   Details  aspirin 81 MG tablet Take 81 mg by mouth daily.    atorvastatin (LIPITOR) 10 MG tablet Take 10 mg by mouth daily. Refills: 0    cholecalciferol (VITAMIN D) 1000 UNITS tablet Take 1,000 Units by mouth daily.    folic acid (FOLVITE) 1 MG tablet Take 1 tablet (1 mg total) by mouth daily. Start 5-7 days before Alimta chemotherapy. Continue until 21 days after Alimta completed. Qty: 100 tablet, Refills: 3   Associated Diagnoses: Malignant neoplasm of lower lobe of left lung    levETIRAcetam (KEPPRA) 1000 MG tablet Take 1 tablet (1,000 mg total) by mouth 2 (two) times daily. Qty: 60 tablet, Refills: 0    ondansetron (ZOFRAN) 8 MG tablet Take 1 tablet (8 mg total) by mouth every 8 (eight) hours as needed for nausea or vomiting. Qty: 20 tablet, Refills: 0    oxyCODONE-acetaminophen (PERCOCET/ROXICET) 5-325 MG per tablet Take 1-2 tablets by mouth every 6 (six) hours as needed for severe pain. Qty: 100 tablet, Refills: 0   Associated Diagnoses: Brain metastasis    PRESCRIPTION MEDICATION Chemotherapy.      STOP taking these medications     hydrochlorothiazide (HYDRODIURIL) 25 MG tablet      potassium chloride SA (K-DUR,KLOR-CON) 20 MEQ tablet      enoxaparin (LOVENOX) 80 MG/0.8ML injection      Potassium Chloride 25 MEQ PACK        No Known Allergies    The results of significant diagnostics from this hospitalization (including imaging, microbiology, ancillary and laboratory) are listed below for reference.    Significant Diagnostic Studies: No results found.  Microbiology: No results found for this or any previous visit (from  the past 240 hour(s)).   Labs: Basic Metabolic Panel:  Recent Labs Lab 06/24/14 0819 06/24/14 1625 06/25/14 0418 06/26/14 0435  NA 143 140 139 139  K 3.1* 3.8 3.2* 3.1*  CL  --  100 100 105  CO2 26 30 30 31   GLUCOSE 90 159* 126* 99  BUN 7.3 10 12 12   CREATININE 0.7 0.85 0.72 0.67  CALCIUM 9.1 9.1 8.4 8.2*  MG  --  2.1  --   --   PHOS  --  2.9  --   --    Liver Function Tests:  Recent Labs Lab 06/24/14 0819 06/24/14 1625  AST 13 16  ALT 31 29  ALKPHOS 107 110  BILITOT 0.82 1.2  PROT 6.7 7.3  ALBUMIN 2.8* 3.1*   No results for input(s): LIPASE, AMYLASE in the last 168 hours. No results for input(s): AMMONIA in the last  168 hours. CBC:  Recent Labs Lab 06/24/14 0819 06/24/14 1625 06/25/14 0418 06/26/14 0435  WBC 9.7 9.9 9.9 8.9  NEUTROABS 6.7*  --   --   --   HGB 11.5* 11.5* 10.3* 10.3*  HCT 34.9 33.2* 30.3* 30.3*  MCV 77.2* 74.8* 75.2* 75.4*  PLT 390 403* 374 341   Cardiac Enzymes: No results for input(s): CKTOTAL, CKMB, CKMBINDEX, TROPONINI in the last 168 hours. BNP: BNP (last 3 results) No results for input(s): BNP in the last 8760 hours.  ProBNP (last 3 results) No results for input(s): PROBNP in the last 8760 hours.  CBG: No results for input(s): GLUCAP in the last 168 hours.     Signed:  Velvet Bathe  [facility] 06/27/2014, 10:09 AM

## 2014-07-08 ENCOUNTER — Ambulatory Visit (HOSPITAL_COMMUNITY): Payer: Medicare Other

## 2014-07-11 ENCOUNTER — Encounter (HOSPITAL_COMMUNITY): Payer: Self-pay

## 2014-07-11 ENCOUNTER — Ambulatory Visit (HOSPITAL_COMMUNITY)
Admission: RE | Admit: 2014-07-11 | Discharge: 2014-07-11 | Disposition: A | Discharge status: Home / Self Care | Payer: Medicare Other | Source: Ambulatory Visit | Attending: Hematology | Admitting: Hematology

## 2014-07-11 DIAGNOSIS — C3492 Malignant neoplasm of unspecified part of left bronchus or lung: Secondary | ICD-10-CM | POA: Diagnosis not present

## 2014-07-11 DIAGNOSIS — Z87891 Personal history of nicotine dependence: Secondary | ICD-10-CM | POA: Diagnosis not present

## 2014-07-11 DIAGNOSIS — Z9221 Personal history of antineoplastic chemotherapy: Secondary | ICD-10-CM | POA: Insufficient documentation

## 2014-07-11 MED ORDER — IOHEXOL 300 MG/ML  SOLN
50.0000 mL | Freq: Once | INTRAMUSCULAR | Status: AC | PRN
  Administered 2014-07-11: 50 mL via ORAL

## 2014-07-11 MED ORDER — IOHEXOL 300 MG/ML  SOLN
100.0000 mL | Freq: Once | INTRAMUSCULAR | Status: AC | PRN
  Administered 2014-07-11: 100 mL via INTRAVENOUS

## 2014-07-12 ENCOUNTER — Encounter: Payer: Self-pay | Admitting: Pharmacist

## 2014-07-15 ENCOUNTER — Ambulatory Visit (HOSPITAL_BASED_OUTPATIENT_CLINIC_OR_DEPARTMENT_OTHER): Payer: Medicare Other | Admitting: Hematology

## 2014-07-15 ENCOUNTER — Telehealth: Payer: Self-pay | Admitting: Hematology

## 2014-07-15 ENCOUNTER — Ambulatory Visit (HOSPITAL_BASED_OUTPATIENT_CLINIC_OR_DEPARTMENT_OTHER): Payer: Medicare Other

## 2014-07-15 ENCOUNTER — Other Ambulatory Visit (HOSPITAL_BASED_OUTPATIENT_CLINIC_OR_DEPARTMENT_OTHER): Payer: Medicare Other

## 2014-07-15 ENCOUNTER — Telehealth: Payer: Self-pay | Admitting: *Deleted

## 2014-07-15 VITALS — BP 109/77 | HR 121 | Temp 97.9°F | Resp 18 | Ht 69.0 in | Wt 180.0 lb

## 2014-07-15 DIAGNOSIS — E876 Hypokalemia: Secondary | ICD-10-CM

## 2014-07-15 DIAGNOSIS — C7931 Secondary malignant neoplasm of brain: Secondary | ICD-10-CM

## 2014-07-15 DIAGNOSIS — C7800 Secondary malignant neoplasm of unspecified lung: Secondary | ICD-10-CM

## 2014-07-15 DIAGNOSIS — C787 Secondary malignant neoplasm of liver and intrahepatic bile duct: Secondary | ICD-10-CM

## 2014-07-15 DIAGNOSIS — C3492 Malignant neoplasm of unspecified part of left bronchus or lung: Secondary | ICD-10-CM

## 2014-07-15 DIAGNOSIS — I82403 Acute embolism and thrombosis of unspecified deep veins of lower extremity, bilateral: Secondary | ICD-10-CM

## 2014-07-15 DIAGNOSIS — C3432 Malignant neoplasm of lower lobe, left bronchus or lung: Secondary | ICD-10-CM

## 2014-07-15 LAB — COMPREHENSIVE METABOLIC PANEL (CC13)
ALT: 8 U/L (ref 0–55)
AST: 11 U/L (ref 5–34)
Albumin: 2.4 g/dL — ABNORMAL LOW (ref 3.5–5.0)
Alkaline Phosphatase: 59 U/L (ref 40–150)
Anion Gap: 17 mEq/L — ABNORMAL HIGH (ref 3–11)
BILIRUBIN TOTAL: 0.8 mg/dL (ref 0.20–1.20)
BUN: 9.1 mg/dL (ref 7.0–26.0)
CO2: 29 meq/L (ref 22–29)
Calcium: 9.3 mg/dL (ref 8.4–10.4)
Chloride: 93 mEq/L — ABNORMAL LOW (ref 98–109)
Creatinine: 0.7 mg/dL (ref 0.6–1.1)
EGFR: >90 mL/min/{1.73_m2} (ref >90)
Glucose: 111 mg/dl (ref 70–140)
Potassium: 2.7 mEq/L — CL (ref 3.5–5.1)
Sodium: 139 mEq/L (ref 136–145)
Total Protein: 7.3 g/dL (ref 6.4–8.3)

## 2014-07-15 LAB — CBC WITH DIFFERENTIAL/PLATELET
BASO%: 0.3 % (ref 0.0–2.0)
Basophils Absolute: 0 10*3/uL (ref 0.0–0.1)
EOS ABS: 0.1 10*3/uL (ref 0.0–0.5)
EOS%: 1 % (ref 0.0–7.0)
HEMATOCRIT: 31.5 % — AB (ref 34.8–46.6)
HEMOGLOBIN: 10.9 g/dL — AB (ref 11.6–15.9)
LYMPH%: 7 % — ABNORMAL LOW (ref 14.0–49.7)
MCH: 25.8 pg (ref 25.1–34.0)
MCHC: 34.6 g/dL (ref 31.5–36.0)
MCV: 74.6 fL — AB (ref 79.5–101.0)
MONO#: 1.6 10*3/uL — AB (ref 0.1–0.9)
MONO%: 13.7 % (ref 0.0–14.0)
NEUT#: 9.1 10*3/uL — ABNORMAL HIGH (ref 1.5–6.5)
NEUT%: 78 % — ABNORMAL HIGH (ref 38.4–76.8)
Platelets: 403 10*3/uL — ABNORMAL HIGH (ref 145–400)
RBC: 4.22 10*6/uL (ref 3.70–5.45)
RDW: 17.2 % — AB (ref 11.2–14.5)
WBC: 11.6 10*3/uL — ABNORMAL HIGH (ref 3.9–10.3)
lymph#: 0.8 10*3/uL — ABNORMAL LOW (ref 0.9–3.3)
nRBC: 0 % (ref 0–0)

## 2014-07-15 LAB — IRON AND TIBC CHCC
%SAT: 8 % — ABNORMAL LOW (ref 21–57)
Iron: 13 ug/dL — ABNORMAL LOW (ref 41–142)
TIBC: 161 ug/dL — ABNORMAL LOW (ref 236–444)
UIBC: 148 ug/dL (ref 120–384)

## 2014-07-15 LAB — FERRITIN CHCC: Ferritin: 1027 ng/ml — ABNORMAL HIGH (ref 9–269)

## 2014-07-15 MED ORDER — HYDROCODONE-HOMATROPINE 5-1.5 MG/5ML PO SYRP: 5.0000 mL | ORAL_SOLUTION | Freq: Four times a day (QID) | ORAL | Status: DC | PRN

## 2014-07-15 MED ORDER — SODIUM CHLORIDE 0.9 % IV SOLN
Freq: Once | INTRAVENOUS | Status: AC
  Administered 2014-07-15: 10:00:00 via INTRAVENOUS
  Filled 2014-07-15: qty 500

## 2014-07-15 MED ORDER — MIRTAZAPINE 15 MG PO TABS: 15.0000 mg | ORAL_TABLET | Freq: Every day | ORAL | Status: AC

## 2014-07-15 MED ORDER — POTASSIUM CHLORIDE CRYS ER 20 MEQ PO TBCR: 20.0000 meq | EXTENDED_RELEASE_TABLET | Freq: Two times a day (BID) | ORAL | Status: AC

## 2014-07-15 NOTE — Patient Instructions (Signed)

## 2014-07-15 NOTE — Telephone Encounter (Signed)
Patient confirm appointment for 02/25 & 2/26. Patient was confused with date, will have her get a calendar at her next appointment.

## 2014-07-15 NOTE — Telephone Encounter (Signed)
Per staff message and POF I have scheduled appts. Advised scheduler of appts. JMW  

## 2014-07-15 NOTE — Progress Notes (Signed)
[place]  [phone] Fax:([phone]) [place] Note   Patient Care Team: Christain Sacramento, MD as PCP - General (Family Medicine) 07/15/2014  CHIEF COMPLAINTS Follow up  Oncology History   Lung cancer   Staging form: Lung, AJCC 7th Edition     Clinical: Stage IV (T3, N2, M1b) - Signed by Truitt Merle, MD on 05/23/2014       Lung cancer   04/19/2014 Imaging brain MRI showed a hemorrhagic mass 1.9cm in r oarital loibe and a 1.6cm lesion in left front lobe, with significant surrounding edema.    04/25/2014 Initial Diagnosis LLL lung adenocarcinoma, diagnosed through CT guided biopsy, TTF-1(+), CK5/6 (+), with metastases to b/l lungs, brain and liver. She presented wiht left side numbness and possible seizure.    04/30/2014 Imaging PET scan showed a 8cm hypermetabolic LLL lung mass, mediastinal adenopathy, multiple small lung nodules (non hypermetabolic) and a single hypermetabolic liver lesion in segment 3.    05/09/2014 -  Radiation Therapy SBRT to brain lesion    06/03/2014 - 06/24/2014 Chemotherapy first line carboplatin and alimta, stopped after 2 cycles due to disease progression   07/11/2014 Progression restaging CT showed mixed response, disease progression in liver, primary left lower lobe lung lesion smaller, loculated left pleural effusion. Clinically much more symptomatic.   MOLECULAR TESTING ([facility]) KRAS G12C ATRXK 1169 [id number] RICTOR amplification  TP53 A83P Negative for EGFR, ALK, MET, HER2, ROS-1, BRAF etc.   CURRENT THERAPY Pending second line Nivolumab starting 07/22/14  INTERIM HISTORY She returns for follow up. She was admitted to hospital for DVT/PE on 06/24/14 and was discharged home on 06/27/2014. She has been more fatigued and has low appetite lately, she also developed more dry cough, and dyspnea on exertion. She came in with a wheelchair today. She lives alone, is still able to take care of herself at home, but is  struggling. Her only child son is coming from [place] to visit her this week.   MEDICAL HISTORY:  Past Medical History  Diagnosis Date  . Hypertension   . Hyperlipidemia   . Knee injury 1983    left  . Lung cancer dx'd 03/2014  . Brain cancer     SURGICAL HISTORY: Past Surgical History  Procedure Laterality Date  . Video bronchoscopy Bilateral 04/19/2014    Procedure: VIDEO BRONCHOSCOPY WITHOUT FLUORO;  Surgeon: Rigoberto Noel, MD;  Location: [facility];  Service: Cardiopulmonary;  Laterality: Bilateral;  . Abdominal hysterectomy  1983    SOCIAL HISTORY: History   Social History  . Marital Status: Single    Spouse Name: N/A  . Number of Children: N/A  . Years of Education: N/A   Occupational History  . Not on file.   Social History Main Topics  . Smoking status: Former Research scientist (life sciences)  . Smokeless tobacco: Current User  . Alcohol Use: Yes     Comment: 2 oz/week  . Drug Use: No  . Sexual Activity: Not Currently   Other Topics Concern  . Not on file   Social History Narrative    FAMILY HISTORY: Family History  Problem Relation Age of Onset  . Cancer Sister 59    unknown detail     ALLERGIES:  has No Known Allergies.  MEDICATIONS:  Current Outpatient Prescriptions  Medication Sig Dispense Refill  . aspirin 81 MG tablet Take 81 mg by mouth daily.    Marland Kitchen atorvastatin (LIPITOR) 10 MG tablet Take 10 mg by mouth daily.  0  . cholecalciferol (VITAMIN D) 1000 UNITS tablet Take 1,000 Units by mouth daily.    Marland Kitchen enoxaparin (LOVENOX) 150 MG/ML injection Inject 0.89 mLs (135 mg total) into the skin daily. 1 mL 0  . folic acid (FOLVITE) 1 MG tablet Take 1 tablet (1 mg total) by mouth daily. Start 5-7 days before Alimta chemotherapy. Continue until 21 days after Alimta completed. 100 tablet 3  . levETIRAcetam (KEPPRA) 1000 MG tablet Take 1 tablet (1,000 mg total) by mouth 2 (two) times daily. 60 tablet 0  . ondansetron (ZOFRAN) 8 MG tablet Take 1 tablet (8 mg total) by  mouth every 8 (eight) hours as needed for nausea or vomiting. 20 tablet 0  . oxyCODONE-acetaminophen (PERCOCET/ROXICET) 5-325 MG per tablet Take 1-2 tablets by mouth every 6 (six) hours as needed for severe pain. 100 tablet 0  . PRESCRIPTION MEDICATION Chemotherapy.    Marland Kitchen HYDROcodone-homatropine (HYCODAN) 5-1.5 MG/5ML syrup Take 5 mLs by mouth every 6 (six) hours as needed for cough. 300 mL 0  . mirtazapine (REMERON) 15 MG tablet Take 1 tablet (15 mg total) by mouth at bedtime. 30 tablet 2  . potassium chloride SA (K-DUR,KLOR-CON) 20 MEQ tablet Take 1 tablet (20 mEq total) by mouth 2 (two) times daily. 60 tablet 1   No current facility-administered medications for this visit.    REVIEW OF SYSTEMS:   Constitutional: Denies fevers, chills or abnormal night sweats Eyes: Denies blurriness of vision, double vision or watery eyes Ears, nose, mouth, throat, and face: Denies mucositis or sore throat Respiratory: Denies cough, dyspnea or wheezes Cardiovascular: Denies palpitation, chest discomfort or lower extremity swelling Gastrointestinal:  Denies nausea, heartburn or change in bowel habits Skin: Denies abnormal skin rashes Lymphatics: Denies new lymphadenopathy or easy bruising Neurological: see HPI  Behavioral/Psych: Mood is stable, no new changes  All other systems were reviewed with the patient and are negative.  PHYSICAL EXAMINATION: ECOG PERFORMANCE STATUS: 3  Filed Vitals:   07/15/14 0845  BP: 109/77  Pulse: 121  Temp: 97.9 F (36.6 C)  Resp: 18   Filed Weights   07/15/14 0845  Weight: 180 lb (81.647 kg)    GENERAL:alert, no distress and comfortable, chronic heel appearing SKIN: skin color, texture, turgor are normal, no rashes or significant lesions EYES: normal, conjunctiva are pink and non-injected, sclera clear OROPHARYNX:no exudate, no erythema and lips, buccal mucosa, and tongue normal  NECK: supple, thyroid normal size, non-tender, without nodularity LYMPH:  no  palpable lymphadenopathy in the cervical, axillary or inguinal LUNGS: Diminished breath sound on the left side upper to mid chest, clear to auscultation and percussion with normal breathing effort on right HEART: regular rate & rhythm and no murmurs and no lower extremity edema ABDOMEN:abdomen soft, non-tender and normal bowel sounds Musculoskeletal:no cyanosis of digits and no clubbing  PSYCH: alert & oriented x 3 with fluent speech NEURO: no focal motor/sensory deficits  LABORATORY DATA:  I have reviewed the data as listed CBC Latest Ref Rng 07/15/2014 06/26/2014 06/25/2014  WBC 3.9 - 10.3 10e3/uL 11.6(H) 8.9 9.9  Hemoglobin 11.6 - 15.9 g/dL 10.9(L) 10.3(L) 10.3(L)  Hematocrit 34.8 - 46.6 % 31.5(L) 30.3(L) 30.3(L)  Platelets 145 - 400 10e3/uL 403(H) 341 374    CMP Latest Ref Rng 07/15/2014 06/26/2014 06/25/2014  Glucose 70 - 140 mg/dl 111 99 126(H)  BUN 7.0 - 26.0 mg/dL 9._0 Creatinine 0.6 - 1.1 mg/dL 0.7 0.67 0.72  Sodium 136 - 145 mEq/L 139 139 139  Potassium  3.5 - 5.1 mEq/L 2.7(LL) 3.1(L) 3.2(L)  Chloride 96 - 112 mmol/L - 105 100  CO2 22 - 29 mEq/L _0 Calcium 8.4 - 10.4 mg/dL 9.3 8.2(L) 8.4  Total Protein 6.4 - 8.3 g/dL 7.3 - -  Total Bilirubin 0.20 - 1.20 mg/dL 0.80 - -  Alkaline Phos 40 - 150 U/L 59 - -  AST 5 - 34 U/L 11 - -  ALT 0 - 55 U/L 8 - -   PATHOLOGY REPORT 04/25/2014 Lung, needle/core biopsy(ies), LLL - NON SMALL CELL CARCINOMA. Microscopic Comment Immunohistochemistry will be performed and reported as an addendum. (JDP:gt, 04/26/14) ADDENDUM: Immunohistochemistry is performed and the tumor is positive with thyroid transcription factor-1 (TTF-1), and shows focal positivity with cytokeratin 5/6. The immunophenotype is consistent with primary lung adenocarcinoma. (JDP:gt, 04/29/14)    RADIOGRAPHIC STUDIES: I have personally reviewed the radiological images as listed and agreed with the findings in the report.  Mr Jeri Cos Wo Contrast 04/19/2014        IMPRESSION:  1. Hemorrhagic mass lesion in the high right parietal lobe measures 19 x 14 x 17 mm.  2. Enhancing mass lesion in the posterior left frontal lobe white matter measures 15 x 16 x 13 mm.  3. There is significant surrounding vasogenic edema with both lesions. These are most compatible with metastases.  4. Moderate generalized atrophy and diffuse white matter disease. This likely reflects the sequelae of chronic microvascular ischemia.     PET 04/30/2014 1. 8.0 x 4.7 cm pleural-based left lower lobe mass with satellite nodule in the left lower lobe, left hilar and mediastinal adenopathy (extending into the superior mediastinum), widespread disease in the left pleural space, and solitary hepatic metastasis. Findings are compatible with stage IV lung cancer. 2. Multiple other smaller pulmonary nodules are nonspecific, but unchanged compared to the prior examination. Metastatic disease to the lungs is not excluded, and continued attention on future followup studies is recommended. 3. 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 4. Additional incidental findings, as above.  CT chest, abdomen and pelvis with IV contrast on 07/12/2014 IMPRESSION: Mixed response.  Left lower lobe mass has decreased in size.  Associated mediastinal/left hilar lymphadenopathy is mildly decreased. Moderate to large left pleural effusion, likely malignant, new/increased.  Suspected mild progression of hepatic metastases.  New 8 mm sclerotic lesion at L4, suspicious for osseous metastasis.  Additional ancillary findings as above.  ASSESSMENT & PLAN:  A 74 year old African-American female with past medical history of hypertension, dyslipidemia, have a smoking, who presents left-sided numbness and possible seizure episodes. A CT of chest reviewed a  large left upper lobe lung mass, hilar and mediastinal adenopathy, and 2 brain lesions with significant surrounding edema. 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 CT-guided lung mass biopsy showed TTF-1 positive non-small cell carcinoma, consistent with primary lung adenocarcinoma.  1. 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 the goal of treatment is palliation to prolong her life and preserve her quality of life. -I discussed the [facility] result with her, unfortunately, there is no actionable mutations, and her tumor is KRAS (+)  -I reviewed her restaging CT scan results. She has had mixed response, the primary lung lesion is  smaller, however she did progressed in the liver, also developed new left pleural effusion. Clinically, she deteriorated significantly, performance status is now 3, which is more consistent with disease progression. -I recommend her to switch to second line therapy with Nivolumab, giving her disease progression and deteriorated performance status. I explained to her that this is a immunotherapy which were recently approved for metastatic lung cancer. The side effects such as infusion reaction, autoimmune related disease such as hypothyroidism, hypopituitarism, colitis, skin rash, heart failure, pneumonitis, etc. were explained to the patient in great details. She agrees to proceed. I tentatively schedule her to start this Friday. -I'll check her baseline TSH, CBC, CMP before her next treatment.   2. 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 [name].   3. HTN -Continue medication next  4. Social support, -She lives alone, has one son in [place]. She does have some support from her neighbors. I offered her to talk to our social worker. -Her son is coming from [place] to  visit her this week  5. Hypokalemia -I'll give her normal saline 500 with potassium 20 mEq a day. -I give her a prescription and instructed her to take potassium chloride 20 mEq twice daily. The  Plan -IV fluids with potassium today -Start second line in follow map this Friday -I'll speak with her son about her condition, per her request.  All questions were answered. The patient knows to call the clinic with any problems, questions or concerns.  I spent 30 minutes counseling the patient face to face. The total time spent in the appointment was 40 minutes and more than 50% was on counseling.     Truitt Merle, MD 07/15/2014

## 2014-07-16 ENCOUNTER — Encounter: Payer: Self-pay | Admitting: Hematology

## 2014-07-18 ENCOUNTER — Ambulatory Visit (HOSPITAL_COMMUNITY)
Admission: RE | Admit: 2014-07-18 | Discharge: 2014-07-18 | Disposition: A | Discharge status: Home / Self Care | Payer: Medicare Other | Source: Ambulatory Visit | Attending: Hematology | Admitting: Hematology

## 2014-07-18 ENCOUNTER — Ambulatory Visit (HOSPITAL_COMMUNITY)
Admission: RE | Admit: 2014-07-18 | Discharge: 2014-07-18 | Disposition: A | Discharge status: Home / Self Care | Payer: Medicare Other | Source: Ambulatory Visit | Attending: Radiology | Admitting: Radiology

## 2014-07-18 DIAGNOSIS — J90 Pleural effusion, not elsewhere classified: Secondary | ICD-10-CM | POA: Insufficient documentation

## 2014-07-18 DIAGNOSIS — C3492 Malignant neoplasm of unspecified part of left bronchus or lung: Secondary | ICD-10-CM

## 2014-07-18 LAB — BODY FLUID CELL COUNT WITH DIFFERENTIAL
Eos, Fluid: 1 %
Lymphs, Fluid: 60 %
Monocyte-Macrophage-Serous Fluid: 31 % — ABNORMAL LOW (ref 50–90)
Neutrophil Count, Fluid: 8 % (ref 0–25)
Total Nucleated Cell Count, Fluid: 877 cu mm (ref 0–1000)

## 2014-07-18 LAB — PROTEIN, BODY FLUID: Total protein, fluid: 4.4 g/dL

## 2014-07-18 LAB — GLUCOSE, SEROUS FLUID: GLUCOSE FL: 80 mg/dL

## 2014-07-18 NOTE — Procedures (Signed)
Successful US guided left thoracentesis. Yielded 777mL of clear amber colored fluid. Pt tolerated procedure well. No immediate complications.  Specimen was sent for labs. CXR ordered.  Ascencion Dike PA-C 07/18/2014 11:20 AM

## 2014-07-19 ENCOUNTER — Telehealth: Payer: Self-pay | Admitting: *Deleted

## 2014-07-19 ENCOUNTER — Ambulatory Visit (HOSPITAL_BASED_OUTPATIENT_CLINIC_OR_DEPARTMENT_OTHER): Payer: Medicare Other

## 2014-07-19 ENCOUNTER — Other Ambulatory Visit (HOSPITAL_BASED_OUTPATIENT_CLINIC_OR_DEPARTMENT_OTHER): Payer: Medicare Other

## 2014-07-19 ENCOUNTER — Other Ambulatory Visit: Payer: Self-pay | Admitting: *Deleted

## 2014-07-19 DIAGNOSIS — C7931 Secondary malignant neoplasm of brain: Secondary | ICD-10-CM

## 2014-07-19 DIAGNOSIS — I10 Essential (primary) hypertension: Secondary | ICD-10-CM | POA: Diagnosis not present

## 2014-07-19 DIAGNOSIS — C3432 Malignant neoplasm of lower lobe, left bronchus or lung: Secondary | ICD-10-CM

## 2014-07-19 DIAGNOSIS — C3492 Malignant neoplasm of unspecified part of left bronchus or lung: Secondary | ICD-10-CM

## 2014-07-19 DIAGNOSIS — Z5112 Encounter for antineoplastic immunotherapy: Secondary | ICD-10-CM

## 2014-07-19 DIAGNOSIS — E876 Hypokalemia: Secondary | ICD-10-CM

## 2014-07-19 LAB — CBC WITH DIFFERENTIAL/PLATELET
BASO%: 0.5 % (ref 0.0–2.0)
Basophils Absolute: 0.1 10*3/uL (ref 0.0–0.1)
EOS%: 1.9 % (ref 0.0–7.0)
Eosinophils Absolute: 0.2 10*3/uL (ref 0.0–0.5)
HCT: 32.6 % — ABNORMAL LOW (ref 34.8–46.6)
HGB: 11.3 g/dL — ABNORMAL LOW (ref 11.6–15.9)
LYMPH%: 10.3 % — ABNORMAL LOW (ref 14.0–49.7)
MCH: 25.9 pg (ref 25.1–34.0)
MCHC: 34.7 g/dL (ref 31.5–36.0)
MCV: 74.8 fL — ABNORMAL LOW (ref 79.5–101.0)
MONO#: 1.6 10*3/uL — AB (ref 0.1–0.9)
MONO%: 12.6 % (ref 0.0–14.0)
NEUT#: 9.6 10*3/uL — ABNORMAL HIGH (ref 1.5–6.5)
NEUT%: 74.7 % (ref 38.4–76.8)
Platelets: 345 10*3/uL (ref 145–400)
RBC: 4.36 10*6/uL (ref 3.70–5.45)
RDW: 17.4 % — ABNORMAL HIGH (ref 11.2–14.5)
WBC: 12.9 10*3/uL — AB (ref 3.9–10.3)
lymph#: 1.3 10*3/uL (ref 0.9–3.3)
nRBC: 0 % (ref 0–0)

## 2014-07-19 LAB — COMPREHENSIVE METABOLIC PANEL (CC13)
ALBUMIN: 2.4 g/dL — AB (ref 3.5–5.0)
ALK PHOS: 59 U/L (ref 40–150)
ALT: 7 U/L (ref 0–55)
AST: 16 U/L (ref 5–34)
Anion Gap: 16 mEq/L — ABNORMAL HIGH (ref 3–11)
BILIRUBIN TOTAL: 1.47 mg/dL — AB (ref 0.20–1.20)
BUN: 8.3 mg/dL (ref 7.0–26.0)
CO2: 27 mEq/L (ref 22–29)
Calcium: 9.2 mg/dL (ref 8.4–10.4)
Chloride: 97 mEq/L — ABNORMAL LOW (ref 98–109)
Creatinine: 0.7 mg/dL (ref 0.6–1.1)
EGFR: >90 mL/min/{1.73_m2} (ref >90)
GLUCOSE: 110 mg/dL (ref 70–140)
POTASSIUM: 3 meq/L — AB (ref 3.5–5.1)
SODIUM: 140 meq/L (ref 136–145)
TOTAL PROTEIN: 7.3 g/dL (ref 6.4–8.3)

## 2014-07-19 LAB — TSH CHCC: TSH: 0.473 m(IU)/L (ref 0.308–3.960)

## 2014-07-19 MED ORDER — SODIUM CHLORIDE 0.9 % IV SOLN
3.0000 mg/kg | Freq: Once | INTRAVENOUS | Status: AC
  Administered 2014-07-19: 240 mg via INTRAVENOUS
  Filled 2014-07-19: qty 24

## 2014-07-19 MED ORDER — SODIUM CHLORIDE 0.9 % IV SOLN
Freq: Once | INTRAVENOUS | Status: AC
  Administered 2014-07-19: 09:00:00 via INTRAVENOUS

## 2014-07-19 MED ORDER — POTASSIUM CHLORIDE CRYS ER 20 MEQ PO TBCR
20.0000 meq | EXTENDED_RELEASE_TABLET | Freq: Once | ORAL | Status: AC
  Administered 2014-07-19: 20 meq via ORAL
  Filled 2014-07-19: qty 1

## 2014-07-19 MED ORDER — SODIUM CHLORIDE 0.9 % IV SOLN
Freq: Once | INTRAVENOUS | Status: AC
  Administered 2014-07-19: 11:00:00 via INTRAVENOUS
  Filled 2014-07-19: qty 500

## 2014-07-19 NOTE — Telephone Encounter (Signed)
Was informed that pt's son here and requested to speak with nurse.   Spoke with pt and son in the lobby.  Was informed by son Christia Reading that he needed FMLA forms to be filled out and signed by md today for his employment.   Informed son that forms will be forwarded to care management but son insisted that Dr. Burr Medico informed him that she would sign forms today at pt's chemo appt. Dr. Burr Medico notified.

## 2014-07-19 NOTE — Progress Notes (Signed)
Dr. Burr Medico aware of pt's elevated HR, okay to proceed with treatment today. Pt has some concerns regarding nausea with this new regimen, informed patient that nivolumab typically does not cause nausea. Dr. Burr Medico to come and speak with patient again regarding treatment plan.

## 2014-07-19 NOTE — Patient Instructions (Addendum)
[facility] Discharge Instructions  Today you received the following: Nivolumab.  To help prevent nausea and vomiting after your treatment, we encourage you to take your nausea medication as directed.   If you develop nausea and vomiting that is not controlled by your nausea medication, call the clinic.   BELOW ARE SYMPTOMS THAT SHOULD BE REPORTED IMMEDIATELY:  *FEVER GREATER THAN 100.5 F  *CHILLS WITH OR WITHOUT FEVER  NAUSEA AND VOMITING THAT IS NOT CONTROLLED WITH YOUR NAUSEA MEDICATION  *UNUSUAL SHORTNESS OF BREATH  *UNUSUAL BRUISING OR BLEEDING  TENDERNESS IN MOUTH AND THROAT WITH OR WITHOUT PRESENCE OF ULCERS  *URINARY PROBLEMS  *BOWEL PROBLEMS  UNUSUAL RASH Items with * indicate a potential emergency and should be followed up as soon as possible.  Feel free to call the clinic you have any questions or concerns. The clinic phone number is [phone].  Nivolumab injection What is this medicine? NIVOLUMAB (nye VOL ue mab) is used to treat certain types of melanoma and lung cancer. This medicine may be used for other purposes; ask your health care provider or pharmacist if you have questions. COMMON BRAND NAME(S): Opdivo What should I tell my health care provider before I take this medicine? They need to know if you have any of these conditions: -eye disease, vision problems -history of pancreatitis -immune system problems -inflammatory bowel disease -kidney disease -liver disease -lung disease -lupus -myasthenia gravis -multiple sclerosis -organ transplant -stomach or intestine problems -thyroid disease -tingling of the fingers or toes, or other nerve disorder -an unusual or allergic reaction to nivolumab, other medicines, foods, dyes, or preservatives -pregnant or trying to get pregnant -breast-feeding How should I use this medicine? This medicine is for infusion into a vein. It is given by a health care professional in a hospital or  clinic setting. A special MedGuide will be given to you before each treatment. Be sure to read this information carefully each time. Talk to your pediatrician regarding the use of this medicine in children. Special care may be needed. Overdosage: If you think you've taken too much of this medicine contact a poison control center or emergency room at once. Overdosage: If you think you have taken too much of this medicine contact a poison control center or emergency room at once. NOTE: This medicine is only for you. Do not share this medicine with others. What if I miss a dose? It is important not to miss your dose. Call your doctor or health care professional if you are unable to keep an appointment. What may interact with this medicine? Interactions have not been studied. This list may not describe all possible interactions. Give your health care provider a list of all the medicines, herbs, non-prescription drugs, or dietary supplements you use. Also tell them if you smoke, drink alcohol, or use illegal drugs. Some items may interact with your medicine. What should I watch for while using this medicine? Tell your doctor or healthcare professional if your symptoms do not start to get better or if they get worse. Your condition will be monitored carefully while you are receiving this medicine. You may need blood work done while you are taking this medicine. What side effects may I notice from receiving this medicine? Side effects that you should report to your doctor or health care professional as soon as possible: -allergic reactions like skin rash, itching or hives, swelling of the face, lips, or tongue -black, tarry stools -bloody or watery diarrhea -changes in vision -chills -  cough -depressed mood -eye pain -feeling anxious -fever -general ill feeling or flu-like symptoms -hair loss -loss of appetite -low blood counts - this medicine may decrease the number of white blood cells, red  blood cells and platelets. You may be at increased risk for infections and bleeding -pain, tingling, numbness in the hands or feet -redness, blistering, peeling or loosening of the skin, including inside the mouth -red pinpoint spots on skin -signs of decreased platelets or bleeding - bruising, pinpoint red spots on the skin, black, tarry stools, blood in the urine -signs of decreased red blood cells - unusually weak or tired, feeling faint or lightheaded, falls -signs of infection - fever or chills, cough, sore throat, pain or trouble passing urine -signs and symptoms of a dangerous change in heartbeat or heart rhythm like chest pain; dizziness; fast or irregular heartbeat; palpitations; feeling faint or lightheaded, falls; breathing problems -signs and symptoms of high blood sugar such as dizziness; dry mouth; dry skin; fruity breath; nausea; stomach pain; increased hunger or thirst; increased urination -signs and symptoms of kidney injury like trouble passing urine or change in the amount of urine -signs and symptoms of liver injury like dark yellow or brown urine; general ill feeling or flu-like symptoms; light-colored stools; loss of appetite; nausea; right upper belly pain; unusually weak or tired; yellowing of the eyes or skin -signs and symptoms of increased potassium like muscle weakness; chest pain; or fast, irregular heartbeat -signs and symptoms of low potassium like muscle cramps or muscle pain; chest pain; dizziness; feeling faint or lightheaded, falls; palpitations; breathing problems; or fast, irregular heartbeat -swelling of the ankles, feet, hands -weight gainSide effects that usually do not require medical attention (report to your doctor or health care professional if they continue or are bothersome): -constipation -general ill feeling or flu-like symptoms -hair loss -loss of appetite -nausea, vomiting This list may not describe all possible side effects. Call your doctor for  medical advice about side effects. You may report side effects to FDA at 1-800-FDA-1088. Where should I keep my medicine? This drug is given in a hospital or clinic and will not be stored at home. NOTE: This sheet is a summary. It may not cover all possible information. If you have questions about this medicine, talk to your doctor, pharmacist, or health care provider.  2015, Elsevier/Gold Standard. (2013-07-30 13:18:19)  Hypokalemia Hypokalemia means that the amount of potassium in the blood is lower than normal.Potassium is a chemical, called an electrolyte, that helps regulate the amount of fluid in the body. It also stimulates muscle contraction and helps nerves function properly.Most of the body's potassium is inside of cells, and only a very small amount is in the blood. Because the amount in the blood is so small, minor changes can be life-threatening. CAUSES  Antibiotics.  Diarrhea or vomiting.  Using laxatives too much, which can cause diarrhea.  Chronic kidney disease.  Water pills (diuretics).  Eating disorders (bulimia).  Low magnesium level.  Sweating a lot. SIGNS AND SYMPTOMS  Weakness.  Constipation.  Fatigue.  Muscle cramps.  Mental confusion.  Skipped heartbeats or irregular heartbeat (palpitations).  Tingling or numbness. DIAGNOSIS  Your health care provider can diagnose hypokalemia with blood tests. In addition to checking your potassium level, your health care provider may also check other lab tests. TREATMENT Hypokalemia can be treated with potassium supplements taken by mouth or adjustments in your current medicines. If your potassium level is very low, you may need to get potassium  through a vein (IV) and be monitored in the hospital. A diet high in potassium is also helpful. Foods high in potassium are:  Nuts, such as peanuts and pistachios.  Seeds, such as sunflower seeds and pumpkin seeds.  Peas, lentils, and lima beans.  Whole grain and  bran cereals and breads.  Fresh fruit and vegetables, such as apricots, avocado, bananas, cantaloupe, kiwi, oranges, tomatoes, asparagus, and potatoes.  Orange and tomato juices.  Red meats.  Fruit yogurt. HOME CARE INSTRUCTIONS  Take all medicines as prescribed by your health care provider.  Maintain a healthy diet by including nutritious food, such as fruits, vegetables, nuts, whole grains, and lean meats.  If you are taking a laxative, be sure to follow the directions on the label. SEEK MEDICAL CARE IF:  Your weakness gets worse.  You feel your heart pounding or racing.  You are vomiting or having diarrhea.  You are diabetic and having trouble keeping your blood glucose in the normal range. SEEK IMMEDIATE MEDICAL CARE IF:  You have chest pain, shortness of breath, or dizziness.  You are vomiting or having diarrhea for more than 2 days.  You faint. MAKE SURE YOU:   Understand these instructions.  Will watch your condition.  Will get help right away if you are not doing well or get worse. Document Released: 05/10/2005 Document Revised: 02/28/2013 Document Reviewed: 11/10/2012 [facility] Patient Information 2015 [place], [place]. This information is not intended to replace advice given to you by your health care provider. Make sure you discuss any questions you have with your health care provider.

## 2014-07-21 LAB — BODY FLUID CULTURE: Culture: NO GROWTH

## 2014-07-22 ENCOUNTER — Other Ambulatory Visit: Payer: Self-pay | Admitting: *Deleted

## 2014-07-22 ENCOUNTER — Encounter: Payer: Self-pay | Admitting: Hematology

## 2014-07-22 ENCOUNTER — Telehealth: Payer: Self-pay | Admitting: *Deleted

## 2014-07-22 ENCOUNTER — Ambulatory Visit (HOSPITAL_BASED_OUTPATIENT_CLINIC_OR_DEPARTMENT_OTHER): Payer: Medicare Other | Admitting: Hematology

## 2014-07-22 ENCOUNTER — Other Ambulatory Visit (HOSPITAL_BASED_OUTPATIENT_CLINIC_OR_DEPARTMENT_OTHER): Payer: Medicare Other

## 2014-07-22 ENCOUNTER — Ambulatory Visit: Payer: Medicare Other | Admitting: Nutrition

## 2014-07-22 VITALS — BP 112/66 | HR 122 | Temp 98.5°F | Resp 18 | Ht 69.0 in | Wt 177.0 lb

## 2014-07-22 DIAGNOSIS — C7931 Secondary malignant neoplasm of brain: Secondary | ICD-10-CM | POA: Diagnosis not present

## 2014-07-22 DIAGNOSIS — C3492 Malignant neoplasm of unspecified part of left bronchus or lung: Secondary | ICD-10-CM

## 2014-07-22 DIAGNOSIS — C3432 Malignant neoplasm of lower lobe, left bronchus or lung: Secondary | ICD-10-CM

## 2014-07-22 DIAGNOSIS — E876 Hypokalemia: Secondary | ICD-10-CM

## 2014-07-22 DIAGNOSIS — E46 Unspecified protein-calorie malnutrition: Secondary | ICD-10-CM

## 2014-07-22 DIAGNOSIS — Z79899 Other long term (current) drug therapy: Secondary | ICD-10-CM | POA: Diagnosis not present

## 2014-07-22 DIAGNOSIS — C7800 Secondary malignant neoplasm of unspecified lung: Secondary | ICD-10-CM

## 2014-07-22 DIAGNOSIS — R634 Abnormal weight loss: Secondary | ICD-10-CM

## 2014-07-22 DIAGNOSIS — C787 Secondary malignant neoplasm of liver and intrahepatic bile duct: Secondary | ICD-10-CM

## 2014-07-22 LAB — CBC WITH DIFFERENTIAL/PLATELET
BASO%: 0.9 % (ref 0.0–2.0)
Basophils Absolute: 0.1 10*3/uL (ref 0.0–0.1)
EOS ABS: 0.3 10*3/uL (ref 0.0–0.5)
EOS%: 2.3 % (ref 0.0–7.0)
HCT: 33.3 % — ABNORMAL LOW (ref 34.8–46.6)
HGB: 10.7 g/dL — ABNORMAL LOW (ref 11.6–15.9)
LYMPH%: 9.6 % — ABNORMAL LOW (ref 14.0–49.7)
MCH: 24.9 pg — ABNORMAL LOW (ref 25.1–34.0)
MCHC: 32.2 g/dL (ref 31.5–36.0)
MCV: 77.4 fL — ABNORMAL LOW (ref 79.5–101.0)
MONO#: 1.4 10*3/uL — ABNORMAL HIGH (ref 0.1–0.9)
MONO%: 11.4 % (ref 0.0–14.0)
NEUT%: 75.8 % (ref 38.4–76.8)
NEUTROS ABS: 9.6 10*3/uL — AB (ref 1.5–6.5)
PLATELETS: 331 10*3/uL (ref 145–400)
RBC: 4.3 10*6/uL (ref 3.70–5.45)
RDW: 18.1 % — AB (ref 11.2–14.5)
WBC: 12.6 10*3/uL — AB (ref 3.9–10.3)
lymph#: 1.2 10*3/uL (ref 0.9–3.3)

## 2014-07-22 LAB — TSH CHCC: TSH: 0.646 m(IU)/L (ref 0.308–3.960)

## 2014-07-22 LAB — COMPREHENSIVE METABOLIC PANEL (CC13)
ALBUMIN: 2.3 g/dL — AB (ref 3.5–5.0)
ALK PHOS: 55 U/L (ref 40–150)
ALT: 7 U/L (ref 0–55)
AST: 15 U/L (ref 5–34)
Anion Gap: 16 mEq/L — ABNORMAL HIGH (ref 3–11)
BUN: 7 mg/dL (ref 7.0–26.0)
CO2: 25 mEq/L (ref 22–29)
Calcium: 9.2 mg/dL (ref 8.4–10.4)
Chloride: 97 mEq/L — ABNORMAL LOW (ref 98–109)
Creatinine: 0.8 mg/dL (ref 0.6–1.1)
EGFR: 89 mL/min/{1.73_m2} — ABNORMAL LOW (ref >90)
Glucose: 107 mg/dl (ref 70–140)
POTASSIUM: 3.6 meq/L (ref 3.5–5.1)
Sodium: 138 mEq/L (ref 136–145)
Total Bilirubin: 1.1 mg/dL (ref 0.20–1.20)
Total Protein: 7.3 g/dL (ref 6.4–8.3)

## 2014-07-22 MED ORDER — ENOXAPARIN SODIUM 150 MG/ML ~~LOC~~ SOLN: 1.5000 mg/kg | SUBCUTANEOUS | Status: DC

## 2014-07-22 NOTE — Progress Notes (Signed)
[facility]  Telephone:(336) [phone] Fax:(336) [facility] Note   Patient Care Team: Christain Sacramento, MD as PCP - General (Family Medicine)   CHIEF COMPLAINTS Follow up  Oncology History   Lung cancer   Staging form: Lung, AJCC 7th Edition     Clinical: Stage IV (T3, N2, M1b) - Signed by Truitt Merle, MD on 05/23/2014       Lung cancer   04/19/2014 Imaging brain MRI showed a hemorrhagic mass 1.9cm in r oarital loibe and a 1.6cm lesion in left front lobe, with significant surrounding edema.    04/25/2014 Initial Diagnosis LLL lung adenocarcinoma, diagnosed through CT guided biopsy, TTF-1(+), CK5/6 (+), with metastases to b/l lungs, brain and liver. She presented wiht left side numbness and possible seizure.    04/30/2014 Imaging PET scan showed a 8cm hypermetabolic LLL lung mass, mediastinal adenopathy, multiple small lung nodules (non hypermetabolic) and a single hypermetabolic liver lesion in segment 3.    05/09/2014 -  Radiation Therapy SBRT to brain lesion    06/03/2014 - 06/24/2014 Chemotherapy first line carboplatin and alimta, stopped after 2 cycles due to disease progression   07/11/2014 Progression restaging CT showed mixed response, disease progression in liver, primary left lower lobe lung lesion smaller, loculated left pleural effusion. Clinically much more symptomatic.   MOLECULAR TESTING ([facility]) KRAS G12C ATRXK 1169 [id number] RICTOR amplification  TP53 A83P Negative for EGFR, ALK, MET, HER2, ROS-1, BRAF etc.   CURRENT THERAPY  Nivolumab started 07/22/14  INTERIM HISTORY She returns for follow up. She had first infusion of nivo last week and tolerated it well. She still has moderate fatigue, but seems to be able to do little more at home. She has abdominal bloating and discomfort, she does not want to take oxycodone. Her appetite is low, she feels nauseated with sweat food, she lost about 18 lbs in the past month. No fever or chill.  Her son came to visit her last week and left yesterday. She lives alone.    MEDICAL HISTORY:  Past Medical History  Diagnosis Date  . Hypertension   . Hyperlipidemia   . Knee injury 1983    left  . Lung cancer dx'd 03/2014  . Brain cancer     SURGICAL HISTORY: Past Surgical History  Procedure Laterality Date  . Video bronchoscopy Bilateral 04/19/2014    Procedure: VIDEO BRONCHOSCOPY WITHOUT FLUORO;  Surgeon: Rigoberto Noel, MD;  Location: [facility];  Service: Cardiopulmonary;  Laterality: Bilateral;  . Abdominal hysterectomy  1983    SOCIAL HISTORY: History   Social History  . Marital Status: Single    Spouse Name: N/A  . Number of Children: N/A  . Years of Education: N/A   Occupational History  . Not on file.   Social History Main Topics  . Smoking status: Former Research scientist (life sciences)  . Smokeless tobacco: Current User  . Alcohol Use: Yes     Comment: 2 oz/week  . Drug Use: No  . Sexual Activity: Not Currently   Other Topics Concern  . Not on file   Social History Narrative    FAMILY HISTORY: Family History  Problem Relation Age of Onset  . Cancer Sister 41    unknown detail     ALLERGIES:  has No Known Allergies.  MEDICATIONS:  Current Outpatient Prescriptions  Medication Sig Dispense Refill  . atorvastatin (LIPITOR) 10 MG tablet Take 10 mg by mouth daily.  0  . cholecalciferol (VITAMIN D) 1000 UNITS  tablet Take 1,000 Units by mouth daily.    Marland Kitchen enoxaparin (LOVENOX) 150 MG/ML injection Inject 0.89 mLs (135 mg total) into the skin daily. 1 mL 0  . folic acid (FOLVITE) 1 MG tablet   3  . levETIRAcetam (KEPPRA) 1000 MG tablet Take 1 tablet (1,000 mg total) by mouth 2 (two) times daily. 60 tablet 0  . mirtazapine (REMERON) 15 MG tablet Take 1 tablet (15 mg total) by mouth at bedtime. 30 tablet 2  . ondansetron (ZOFRAN) 8 MG tablet Take 1 tablet (8 mg total) by mouth every 8 (eight) hours as needed for nausea or vomiting. 20 tablet 0  . oxyCODONE-acetaminophen  (PERCOCET/ROXICET) 5-325 MG per tablet Take 1-2 tablets by mouth every 6 (six) hours as needed for severe pain. 100 tablet 0  . potassium chloride SA (K-DUR,KLOR-CON) 20 MEQ tablet Take 1 tablet (20 mEq total) by mouth 2 (two) times daily. 60 tablet 1  . PRESCRIPTION MEDICATION Chemotherapy.    Marland Kitchen HYDROcodone-homatropine (HYCODAN) 5-1.5 MG/5ML syrup Take 5 mLs by mouth every 6 (six) hours as needed for cough. (Patient not taking: Reported on 07/22/2014) 300 mL 0   No current facility-administered medications for this visit.    REVIEW OF SYSTEMS:   Constitutional: Denies fevers, chills or abnormal night sweats Eyes: Denies blurriness of vision, double vision or watery eyes Ears, nose, mouth, throat, and face: Denies mucositis or sore throat Respiratory: Denies cough, dyspnea or wheezes Cardiovascular: Denies palpitation, chest discomfort or lower extremity swelling Gastrointestinal:  Denies nausea, heartburn or change in bowel habits Skin: Denies abnormal skin rashes Lymphatics: Denies new lymphadenopathy or easy bruising Neurological: see HPI  Behavioral/Psych: Mood is stable, no new changes  All other systems were reviewed with the patient and are negative.  PHYSICAL EXAMINATION: ECOG PERFORMANCE STATUS: 3  Filed Vitals:   07/22/14 0915  BP: 112/66  Pulse: 122  Temp: 98.5 F (36.9 C)  Resp: 18   Filed Weights   07/22/14 0915  Weight: 177 lb (80.287 kg)   O2sat 99% at rest   GENERAL:alert, no distress and comfortable, chronic heel appearing, sitting in wheelchair  SKIN: skin color, texture, turgor are normal, no rashes or significant lesions EYES: normal, conjunctiva are pink and non-injected, sclera clear OROPHARYNX:no exudate, no erythema and lips, buccal mucosa, and tongue normal  NECK: supple, thyroid normal size, non-tender, without nodularity LYMPH:  no palpable lymphadenopathy in the cervical, axillary or inguinal LUNGS: Diminished breath sound on the left side upper  to mid chest, clear to auscultation and percussion with normal breathing effort on right HEART: regular rate & rhythm and no murmurs and no lower extremity edema ABDOMEN:abdomen soft, non-tender and normal bowel sounds Musculoskeletal:no cyanosis of digits and no clubbing  PSYCH: alert & oriented x 3 with fluent speech NEURO: no focal motor/sensory deficits  LABORATORY DATA:  I have reviewed the data as listed CBC Latest Ref Rng 07/22/2014 07/19/2014 07/15/2014  WBC 3.9 - 10.3 10e3/uL 12.6(H) 12.9(H) 11.6(H)  Hemoglobin 11.6 - 15.9 g/dL 10.7(L) 11.3(L) 10.9(L)  Hematocrit 34.8 - 46.6 % 33.3(L) 32.6(L) 31.5(L)  Platelets 145 - 400 10e3/uL 331 345 403(H)    CMP Latest Ref Rng 07/19/2014 07/15/2014 06/26/2014  Glucose 70 - 140 mg/dl 110 111 99  BUN 7.0 - 26.0 mg/dL 8.3 9.1 12  Creatinine 0.6 - 1.1 mg/dL 0.7 0.7 0.67  Sodium 136 - 145 mEq/L 140 139 139  Potassium 3.5 - 5.1 mEq/L 3.0(LL) 2.7(LL) 3.1(L)  Chloride 96 - 112 mmol/L - -  105  CO2 22 - 29 mEq/L 27 29 31   Calcium 8.4 - 10.4 mg/dL 9.2 9.3 8.2(L)  Total Protein 6.4 - 8.3 g/dL 7.3 7.3 -  Total Bilirubin 0.20 - 1.20 mg/dL 1.47(H) 0.80 -  Alkaline Phos 40 - 150 U/L 59 59 -  AST 5 - 34 U/L 16 11 -  ALT 0 - 55 U/L 7 8 -   PATHOLOGY REPORT 04/25/2014 Lung, needle/core biopsy(ies), LLL - NON SMALL CELL CARCINOMA. Microscopic Comment Immunohistochemistry will be performed and reported as an addendum. (JDP:gt, 04/26/14) ADDENDUM: Immunohistochemistry is performed and the tumor is positive with thyroid transcription factor-1 (TTF-1), and shows focal positivity with cytokeratin 5/6. The immunophenotype is consistent with primary lung adenocarcinoma. (JDP:gt, 04/29/14)    RADIOGRAPHIC STUDIES: I have personally reviewed the radiological images as listed and agreed with the findings in the report.  Mr Jeri Cos Wo Contrast 04/19/2014      IMPRESSION:  1. Hemorrhagic mass lesion in the high right parietal lobe measures 19 x 14 x 17 mm.  2.  Enhancing mass lesion in the posterior left frontal lobe white matter measures 15 x 16 x 13 mm.  3. There is significant surrounding vasogenic edema with both lesions. These are most compatible with metastases.  4. Moderate generalized atrophy and diffuse white matter disease. This likely reflects the sequelae of chronic microvascular ischemia.     PET 04/30/2014 1. 8.0 x 4.7 cm pleural-based left lower lobe mass with satellite nodule in the left lower lobe, left hilar and mediastinal adenopathy (extending into the superior mediastinum), widespread disease in the left pleural space, and solitary hepatic metastasis. Findings are compatible with stage IV lung cancer. 2. Multiple other smaller pulmonary nodules are nonspecific, but unchanged compared to the prior examination. Metastatic disease to the lungs is not excluded, and continued attention on future followup studies is recommended. 3. 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 4. Additional incidental findings, as above.  CT chest, abdomen and pelvis with IV contrast on 07/12/2014 IMPRESSION: Mixed response.  Left lower lobe mass has decreased in size.  Associated mediastinal/left hilar lymphadenopathy is mildly decreased. Moderate to large left pleural effusion, likely malignant, new/increased.  Suspected mild progression of hepatic metastases.  New 8 mm sclerotic lesion at L4, suspicious for osseous metastasis.  Additional ancillary findings as above.  ASSESSMENT & PLAN:  A 74 year old African-American female with past medical history of hypertension, dyslipidemia, have a smoking, who presents left-sided numbness and possible seizure episodes. A CT of chest reviewed a large left upper lobe lung mass, hilar and mediastinal adenopathy, and 2 brain lesions with significant  surrounding edema. 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 CT-guided lung mass biopsy showed TTF-1 positive non-small cell carcinoma, consistent with primary lung adenocarcinoma.  1. 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 the goal of treatment is palliation to prolong her life and preserve her quality of life. -I discussed the [facility] one result with her, unfortunately, there is no actionable mutations, and her tumor is KRAS (+)  -I reviewed her restaging CT scan results. She has had mixed response, the primary lung lesion is smaller, however she did progressed in the liver, also developed new left pleural effusion. Clinically, she  deteriorated significantly, performance status is now 3, which is more consistent with disease progression. -I recommended her to switch to second line therapy with Nivolumab, giving her disease progression and deteriorated performance status. I explained to her that this is a immunotherapy which were recently approved for metastatic lung cancer. The side effects such as infusion reaction, autoimmune related disease such as hypothyroidism, hypopituitarism, colitis, skin rash, heart failure, pneumonitis, etc. were explained to the patient in great details. She agrees to proceed.  -She tolerated the first infusion of nivo well, will continue  -Follow CBC, CMP, TSH when she is on treatment  2. 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 [name].   3. HTN -Continue medication next  4. Social support, -She lives alone, has one son in [place]. She does have some support from her neighbors. I offered her to talk to our social worker. -Her son is coming from [place] to visit her this week  5. Hypokalemia -She will continue to take take potassium chloride 20 mEq twice  daily.  6. Weight loss and malnutrition -Refer her to dietitian Raford Pitcher, she will given her some sample of boost and Ensure, and will meet and evaluate her when she comes for treatment -I encouraged her to take mirtazapine daily. She will try to once in the past.  Follow-up: I'll see her back next Friday before her second dose of Nivolumab.  All questions were answered. The patient knows to call the clinic with any problems, questions or concerns.  I spent 20 minutes counseling the patient face to face. The total time spent in the appointment was 25 minutes and more than 50% was on counseling.     Truitt Merle, MD

## 2014-07-22 NOTE — Telephone Encounter (Signed)
Per staff message and POF I have scheduled appts. Advised scheduler of appts. JMW  

## 2014-07-22 NOTE — Telephone Encounter (Signed)
-----   Message from Braulio Bosch, RN sent at 07/19/2014  9:54 AM EST ----- Regarding: Chemo Follow up call First time Nivolumab. Dr. Burr Medico

## 2014-07-22 NOTE — Telephone Encounter (Signed)
Called Michelle Cobb for chemotherapy F/U.    Reports she received a call from [facility] that her "Lovenox injections will cost $1100.  I paid $100.00 for the first order. I want to talk to Dr. Burr Medico to find out why the change in medication.  Call Back is [phone]."  Patient is doing well.  "So far so good."  Denies n/v.  Denies any new side effects or symptoms.  Bowel and bladder is functioning well.  Eating and drinking well and I instructed to drink 64 oz minimum daily or at least the day before, of and after treatment.  Denies questions at this time and encouraged to call if needed.  Reviewed how to call after hours in the case of an emergency.

## 2014-07-22 NOTE — Progress Notes (Signed)
Received a request from physician to provide patient with oral nutrition supplements. Met briefly with patient in exam room. Patient reports poor appetite. Patient states she has not been taking Remeron as prescribed. Patient is agreeable to trying oral nutrition supplements.   I provided her with samples of both ensure and boost.  Patient declined Carnation breakfast samples because she does not typically drink milk. Brief education provided on small frequent meals utilizing high calorie, high protein foods. Will meet with patient at next chemotherapy on Friday, March 11.  **Disclaimer: This note was dictated with voice recognition software. Similar sounding words can inadvertently be transcribed and this note may contain transcription errors which may not have been corrected upon publication of note.**

## 2014-07-23 ENCOUNTER — Encounter (HOSPITAL_COMMUNITY): Payer: Self-pay | Admitting: Emergency Medicine

## 2014-07-23 ENCOUNTER — Other Ambulatory Visit: Payer: Self-pay | Admitting: *Deleted

## 2014-07-23 ENCOUNTER — Telehealth: Payer: Self-pay | Admitting: Hematology

## 2014-07-23 ENCOUNTER — Emergency Department (HOSPITAL_COMMUNITY): Payer: Medicare Other

## 2014-07-23 ENCOUNTER — Emergency Department (HOSPITAL_COMMUNITY)
Admission: EM | Admit: 2014-07-23 | Discharge: 2014-07-23 | Disposition: A | Discharge status: Home / Self Care | Payer: Medicare Other | Attending: Emergency Medicine | Admitting: Emergency Medicine

## 2014-07-23 DIAGNOSIS — I10 Essential (primary) hypertension: Secondary | ICD-10-CM | POA: Insufficient documentation

## 2014-07-23 DIAGNOSIS — C7931 Secondary malignant neoplasm of brain: Secondary | ICD-10-CM | POA: Diagnosis not present

## 2014-07-23 DIAGNOSIS — R251 Tremor, unspecified: Secondary | ICD-10-CM | POA: Diagnosis not present

## 2014-07-23 DIAGNOSIS — Z7901 Long term (current) use of anticoagulants: Secondary | ICD-10-CM | POA: Insufficient documentation

## 2014-07-23 DIAGNOSIS — R569 Unspecified convulsions: Secondary | ICD-10-CM | POA: Diagnosis present

## 2014-07-23 DIAGNOSIS — Z79899 Other long term (current) drug therapy: Secondary | ICD-10-CM | POA: Insufficient documentation

## 2014-07-23 DIAGNOSIS — Z87828 Personal history of other (healed) physical injury and trauma: Secondary | ICD-10-CM | POA: Diagnosis not present

## 2014-07-23 DIAGNOSIS — C787 Secondary malignant neoplasm of liver and intrahepatic bile duct: Secondary | ICD-10-CM | POA: Insufficient documentation

## 2014-07-23 DIAGNOSIS — E785 Hyperlipidemia, unspecified: Secondary | ICD-10-CM | POA: Diagnosis not present

## 2014-07-23 DIAGNOSIS — C7800 Secondary malignant neoplasm of unspecified lung: Secondary | ICD-10-CM | POA: Diagnosis not present

## 2014-07-23 DIAGNOSIS — Z87891 Personal history of nicotine dependence: Secondary | ICD-10-CM | POA: Insufficient documentation

## 2014-07-23 LAB — URINALYSIS, ROUTINE W REFLEX MICROSCOPIC
Glucose, UA: NEGATIVE mg/dL
Hgb urine dipstick: NEGATIVE
KETONES UR: 40 mg/dL — AB
Leukocytes, UA: NEGATIVE
NITRITE: NEGATIVE
Protein, ur: NEGATIVE mg/dL
Specific Gravity, Urine: 1.023 (ref 1.005–1.030)
UROBILINOGEN UA: 4 mg/dL — AB (ref 0.0–1.0)
pH: 6.5 (ref 5.0–8.0)

## 2014-07-23 LAB — BASIC METABOLIC PANEL
ANION GAP: 13 (ref 5–15)
BUN: 8 mg/dL (ref 6–23)
CO2: 30 mmol/L (ref 19–32)
Calcium: 8.6 mg/dL (ref 8.4–10.5)
Chloride: 96 mmol/L (ref 96–112)
Creatinine, Ser: 0.67 mg/dL (ref 0.50–1.10)
GFR, EST NON AFRICAN AMERICAN: 85 mL/min — AB (ref >90)
Glucose, Bld: 106 mg/dL — ABNORMAL HIGH (ref 70–99)
Potassium: 3.4 mmol/L — ABNORMAL LOW (ref 3.5–5.1)
SODIUM: 139 mmol/L (ref 135–145)

## 2014-07-23 LAB — CBC WITH DIFFERENTIAL/PLATELET
BASOS ABS: 0.1 10*3/uL (ref 0.0–0.1)
BASOS PCT: 0 % (ref 0–1)
EOS PCT: 2 % (ref 0–5)
Eosinophils Absolute: 0.3 10*3/uL (ref 0.0–0.7)
HCT: 30.1 % — ABNORMAL LOW (ref 36.0–46.0)
HEMOGLOBIN: 10.2 g/dL — AB (ref 12.0–15.0)
Lymphocytes Relative: 9 % — ABNORMAL LOW (ref 12–46)
Lymphs Abs: 1.2 10*3/uL (ref 0.7–4.0)
MCH: 25.4 pg — AB (ref 26.0–34.0)
MCHC: 33.9 g/dL (ref 30.0–36.0)
MCV: 75.1 fL — ABNORMAL LOW (ref 78.0–100.0)
Monocytes Absolute: 1.6 10*3/uL — ABNORMAL HIGH (ref 0.1–1.0)
Monocytes Relative: 12 % (ref 3–12)
Neutro Abs: 10.4 10*3/uL — ABNORMAL HIGH (ref 1.7–7.7)
Neutrophils Relative %: 77 % (ref 43–77)
PLATELETS: 359 10*3/uL (ref 150–400)
RBC: 4.01 MIL/uL (ref 3.87–5.11)
RDW: 17.4 % — ABNORMAL HIGH (ref 11.5–15.5)
WBC: 13.6 10*3/uL — ABNORMAL HIGH (ref 4.0–10.5)

## 2014-07-23 MED ORDER — LEVETIRACETAM 750 MG PO TABS: 1500.0000 mg | ORAL_TABLET | Freq: Two times a day (BID) | ORAL | Status: DC

## 2014-07-23 NOTE — Telephone Encounter (Signed)
S/w pt and she is aware of her appts 3/11

## 2014-07-23 NOTE — ED Notes (Signed)
Spoke with patient listed contact. Contact states he will be here to pick patient up shortly.

## 2014-07-23 NOTE — ED Notes (Signed)
Family at bedside. 

## 2014-07-23 NOTE — ED Notes (Signed)
Patient verbalizes understanding of discharge instructions, prescription medications, home care and follow up care. Patient out of department at this time. 

## 2014-07-23 NOTE — Telephone Encounter (Signed)
Patient called asking to speak with Dr, Burr Medico about her medication.  I am not paying that much for anything."  This nurse asked how many syringes she has on hand.  "Thirty syringes left because I haven't used it in a while.  The swelling went down.  I 've only used it once since I had scans on 07-11-2014.  She told me to use two syringes because one isn't enough"    Will ask Dr. Burr Medico to call patient and for dose clarification.      1250 This [facility].  "The Lovenox went through per pharmacist and does not require prior authorization.  She has met some limit perhaps and needs to call her insurance company as they do not tell us the reason.  First order was 80 mg syringes cost $100.00.  The order for 150 mg will cost $1286.76 and is on hold."  With first 80 mg order she needs to use two syringes to equal the new 150 mg.

## 2014-07-23 NOTE — Discharge Instructions (Signed)

## 2014-07-23 NOTE — ED Notes (Signed)
Patient said she doesn't have to use the batheoom, asked if she could try, patient denies.

## 2014-07-23 NOTE — ED Notes (Signed)
Bed: WA06 Expected date:  Expected time:  Means of arrival:  Comments: EMS_left arm tremors

## 2014-07-23 NOTE — ED Provider Notes (Signed)
CSN: [id number]     Arrival date & time 07/23/14  1644 History   First MD Initiated Contact with Patient 07/23/14 1653     Chief Complaint  Patient presents with  . Focal Seizures      (Consider location/radiation/quality/duration/timing/severity/associated sxs/prior Treatment) Patient is a 74 y.o. female presenting with seizures.  Seizures Seizure activity on arrival: no   Seizure type:  Focal Preceding symptoms: numbness (chronically)   Preceding symptoms: no sensation of an aura present and no hyperventilation   Initial focality:  Upper extremity (L) Episode characteristics: focal shaking   Postictal symptoms: no confusion, no memory loss and no somnolence   Return to baseline: yes   Severity:  Mild Duration:  5 minutes Timing:  Once Number of seizures this episode:  1 Progression:  Unchanged Context comment:  Known metastatic lung cancer, liver and brain mets Meds prior to arrival: took keppra. History of seizures: yes   Similar to previous episodes: no     Past Medical History  Diagnosis Date  . Hypertension   . Hyperlipidemia   . Knee injury 1983    left  . Lung cancer dx'd 03/2014  . Brain cancer    Past Surgical History  Procedure Laterality Date  . Video bronchoscopy Bilateral 04/19/2014    Procedure: VIDEO BRONCHOSCOPY WITHOUT FLUORO;  Surgeon: Rigoberto Noel, MD;  Location: [place];  Service: Cardiopulmonary;  Laterality: Bilateral;  . Abdominal hysterectomy  1983   Family History  Problem Relation Age of Onset  . Cancer Sister 20    unknown detail    History  Substance Use Topics  . Smoking status: Former Research scientist (life sciences)  . Smokeless tobacco: Current User  . Alcohol Use: Yes     Comment: 2 oz/week   OB History    No data available     Review of Systems  Neurological: Positive for seizures.  All other systems reviewed and are negative.     Allergies  Review of patient's allergies indicates no known allergies.  Home Medications   Prior to  Admission medications   Medication Sig Start Date End Date Taking? Authorizing Provider  atorvastatin (LIPITOR) 10 MG tablet Take 10 mg by mouth at bedtime.  02/18/14  Yes Historical Provider, MD  cholecalciferol (VITAMIN D) 1000 UNITS tablet Take 1,000 Units by mouth daily.   Yes Historical Provider, MD  enoxaparin (LOVENOX) 150 MG/ML injection Inject 0.89 mLs (135 mg total) into the skin daily. 07/22/14  Yes Truitt Merle, MD  folic acid (FOLVITE) 1 MG tablet Take 1 mg by mouth daily.  05/23/14  Yes Historical Provider, MD  mirtazapine (REMERON) 15 MG tablet Take 1 tablet (15 mg total) by mouth at bedtime. 07/15/14  Yes Truitt Merle, MD  ondansetron (ZOFRAN) 8 MG tablet Take 1 tablet (8 mg total) by mouth every 8 (eight) hours as needed for nausea or vomiting. 05/23/14  Yes Truitt Merle, MD  oxyCODONE-acetaminophen (PERCOCET/ROXICET) 5-325 MG per tablet Take 1-2 tablets by mouth every 6 (six) hours as needed for severe pain. 06/19/14  Yes Lora Paula, MD  potassium chloride SA (K-DUR,KLOR-CON) 20 MEQ tablet Take 1 tablet (20 mEq total) by mouth 2 (two) times daily. 07/15/14  Yes Truitt Merle, MD  PRESCRIPTION MEDICATION Chemotherapy.   Yes Historical Provider, MD  HYDROcodone-homatropine (HYCODAN) 5-1.5 MG/5ML syrup Take 5 mLs by mouth every 6 (six) hours as needed for cough. Patient not taking: Reported on 07/23/2014 07/15/14   Truitt Merle, MD  levETIRAcetam (KEPPRA) 750 MG tablet  Take 2 tablets (1,500 mg total) by mouth 2 (two) times daily. 07/23/14   Debby Freiberg, MD   BP 103/66 mmHg  Pulse 102  Temp(Src) 98.3 F (36.8 C) (Oral)  Resp 18  SpO2 95% Physical Exam  Constitutional: She is oriented to person, place, and time. She appears well-developed and well-nourished.  HENT:  Head: Normocephalic and atraumatic.  Right Ear: External ear normal.  Left Ear: External ear normal.  Eyes: Conjunctivae and EOM are normal. Pupils are equal, round, and reactive to light.  Neck: Normal range of motion. Neck supple.   Cardiovascular: Normal rate, regular rhythm, normal heart sounds and intact distal pulses.   Pulmonary/Chest: Effort normal and breath sounds normal.  Abdominal: Soft. Bowel sounds are normal. There is no tenderness.  Musculoskeletal: Normal range of motion.  Neurological: She is alert and oriented to person, place, and time. She has normal strength and normal reflexes. No cranial nerve deficit or sensory deficit. GCS eye subscore is 4. GCS verbal subscore is 5. GCS motor subscore is 6.  Skin: Skin is warm and dry.  Vitals reviewed.   ED Course  Procedures (including critical care time) Labs Review Labs Reviewed  CBC WITH DIFFERENTIAL/PLATELET - Abnormal; Notable for the following:    WBC 13.6 (*)    Hemoglobin 10.2 (*)    HCT 30.1 (*)    MCV 75.1 (*)    MCH 25.4 (*)    RDW 17.4 (*)    Neutro Abs 10.4 (*)    Lymphocytes Relative 9 (*)    Monocytes Absolute 1.6 (*)    All other components within normal limits  BASIC METABOLIC PANEL - Abnormal; Notable for the following:    Potassium 3.4 (*)    Glucose, Bld 106 (*)    GFR calc non Af Amer 85 (*)    All other components within normal limits  URINALYSIS, ROUTINE W REFLEX MICROSCOPIC - Abnormal; Notable for the following:    Color, Urine AMBER (*)    Bilirubin Urine MODERATE (*)    Ketones, ur 40 (*)    Urobilinogen, UA 4.0 (*)    All other components within normal limits    Imaging Review Dg Chest 2 View  07/23/2014   CLINICAL DATA:  Focal left arm seizures; known history of brain cancer and lung cancer. Initial encounter.  EXAM: CHEST  2 VIEW  COMPARISON:  Chest radiograph performed 07/18/2014  FINDINGS: There is mildly increased moderate left pleural effusion with associated airspace opacity. The right lung appears clear. There is mild rightward mediastinal shift, likely reflecting the pleural effusion. No pneumothorax is seen. The underlying left lower lobe lung mass is not well characterized on radiograph.  The  cardiomediastinal silhouette remains borderline normal in size. No acute osseous abnormalities are identified.  IMPRESSION: Mildly increased moderate left pleural effusion, with associated airspace opacity. Underlying known lung mass not well characterized on radiograph.   Electronically Signed   By: Garald Balding M.D.   On: 07/23/2014 18:17   Ct Head Wo Contrast  07/23/2014   CLINICAL DATA:  Focal seizure  EXAM: CT HEAD WITHOUT CONTRAST  TECHNIQUE: Contiguous axial images were obtained from the base of the skull through the vertex without intravenous contrast.  COMPARISON:  04/18/2014  FINDINGS: Skull and Sinuses:Negative for fracture or destructive process. The mastoids, middle ears, and imaged paranasal sinuses are clear.  Orbits: Dysconjugate gaze.  Brain: Known cerebral metastases in the bilateral posterior cerebral hemispheres, parietal on the right and frontal parietal on  the left. The masses are cortically based on the right and subcortical on the left. Edema surrounding the left-sided metastasis has increased, but there is no midline shift or herniation, and the metastasis has increased in diameter to 18 mm (13 mm by contrast-enhanced MRI 05/01/2014). The edema tracks into the external and internal capsules posteriorly, making the posterior putamen prominent. Low-attenuation in the bilateral anterior internal capsules is chronic, likely from multiple dilated perivascular spaces and white matter gliosis based on previous brain MRI. There is no evidence of acute hemorrhage. No acute infarct or hydrocephalus.  IMPRESSION: Known brain metastases. Compared to 05/01/2014 brain MRI, a left posterior frontal metastasis has increased size and vasogenic edema.   Electronically Signed   By: Monte Fantasia M.D.   On: 07/23/2014 21:09     EKG Interpretation None      MDM   Final diagnoses:  Shaking    74 y.o. female with pertinent PMH of metastatic lung ca with brain and liver mets presents with  recurrent seizure, L focal.  Had brain radiation and has not had a seizure since that time until today.  She takes keppra BID normally, took an extra dose after the seizure.  On arrival pt asymptomatic.  Exam with no deficits.  Wu unremarkable.  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  Spoke with output neurology who agreed with plan to dc home with primary neuro fu, increase keppra dosing to 1500 BID. DC home in stable condition.  I have reviewed all laboratory and imaging studies if ordered as above  1. Shaking         Debby Freiberg, MD 07/24/14 [id number]

## 2014-07-23 NOTE — ED Notes (Signed)
Michelle Cobb is getting the blood

## 2014-07-23 NOTE — ED Notes (Signed)
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 40 mins ago and lasted 5 mins. Took her keppra. Denies grand mal/full body seizures. Alert and oriented.

## 2014-07-23 NOTE — ED Notes (Signed)
Patient has tried to go to the bathroom again unsuccessfully, let RN Larkin Ina know

## 2014-07-23 NOTE — ED Notes (Signed)
Pt experiencing focal seizure. Pt reports lasting about 5 minutes. Pt reports coherent throughout event.

## 2014-07-24 ENCOUNTER — Other Ambulatory Visit: Payer: Self-pay | Admitting: Radiation Oncology

## 2014-07-24 ENCOUNTER — Telehealth: Payer: Self-pay | Admitting: *Deleted

## 2014-07-24 DIAGNOSIS — C7931 Secondary malignant neoplasm of brain: Secondary | ICD-10-CM

## 2014-07-24 MED ORDER — DEXAMETHASONE 4 MG PO TABS: 4.0000 mg | ORAL_TABLET | Freq: Three times a day (TID) | ORAL | Status: AC

## 2014-07-24 NOTE — Telephone Encounter (Signed)
Messages received from financial advocate that there are no assistance programs for lovenox.  Patient should call insurance to determine why the increase in cost.  This nurse called patient at this time who reports she will call but not today as she has syringes left over and had seizures and returned home at 1:00 am so she will rest because she is tired.  Will notify Dr. Burr Medico.

## 2014-07-24 NOTE — Progress Notes (Signed)
  Radiation Oncology         [phone] ________________________________  Name: Michelle Cobb MRN: [id number]  Date: 07/24/2014  DOB: 01-29-1941  Chart Note:  MRI reviewed.  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  Start course of steroids.    Attached is the full text of one of the articles. They show sequential MRIs. [email or web address]  ------------- Eur J Cancer. 2012 Nov;48(16):3045-51. doi: 10.1016/j.ejca.2012.05.016. Epub 2012 Jun 22. 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 Du Four S(1), Wilgenhof S, Duerinck J, Michotte A, [place] A, [place], [place] B. Chief Strategy Officer information:  (1)Department of Medical Oncology, [place], [place], [place]. [email or web address] Metastasis to the brain is a frequent event in patients with advanced melanoma. Despite treatment with neurosurgery, pancranial irradiation and high-precision conformal radiotherapy, the prognosis of patients suffering from melanoma brain metastasis has remained very poor. Ipilimumab is a new effective immunotherapy for the treatment of advanced melanoma and has demonstrated activity against brain metastases. 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 As new active systemic treatment options become available that improve the survival of patients with melanoma brain metastases, adequate diagnosis and management of the late sequela from radiation to the brain is likely to gain importance in the management of these Patients.   Case Rep Oncol Med. [phone]. doi: 10.1155/2014/417913. Epub 2014 Jul 1. 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 Du Four S(1), [place]), Chan M(3), Charakidis M(4), Duerinck J(5), Belva Crome S(6), Wang W(7), Dorris Carnes), Michotte A(9), [place] M(4), [place] B(10), Fogarty G(11), Keff[place] R(3), Neyns B(6). Four cases previously treated with ipilimumab with a total of six histologically  confirmed symptomatic lesions of RNB without any sign of active tumour following  stereotactic irradiation of MBM are reported. These lesions were all originally thought to be disease recurrence. In two cases, ipilimumab was given prior to SRT; in the other two ipilimumab was given after SRT. The average time from first ipilimumab to RNB was 15 months. The average time from SRT to RNB was 11 months.  The average time from first diagnosis of MBM to last follow-up was 20 months at which time three patients were still alive, one with no evidence of disease. These cases represent approximately three percent of the total cases of melanoma  and ten percent of those cases treated with ipilimumab irradiated in our respective centres collectively. We report this to highlight this new problem so  that others may have a high index of suspicion, allowing, if clinically warranted, aggressive surgical salvage, possibly resulting in increased survival. Further studies prospectively collecting data to understand the denominator of this problem are needed to determine whether this problem is just the result of longer survival or whether there is some synergy between these two modalities that are increasingly being used together.  ________________________________  [name]. [name], M.D.

## 2014-07-24 NOTE — Telephone Encounter (Signed)
Spoke with pt today and was informed that pt had already spoken with  Dr. Burr Medico  re:  Pt to continue with Lovenox 80mg   SQ  Twice daily since her copay will be only $ 100.00

## 2014-07-25 ENCOUNTER — Encounter: Payer: Self-pay | Admitting: *Deleted

## 2014-07-25 ENCOUNTER — Telehealth: Payer: Self-pay | Admitting: Radiation Oncology

## 2014-07-25 ENCOUNTER — Other Ambulatory Visit: Payer: Self-pay | Admitting: Radiation Therapy

## 2014-07-25 DIAGNOSIS — C7931 Secondary malignant neoplasm of brain: Secondary | ICD-10-CM

## 2014-07-25 NOTE — Progress Notes (Signed)
[place] Work  Clinical Social Work was referred by nurse for assessment of psychosocial needs due to son requesting information about options for assistance at home for pt. CSW explained to son what medicare would and would not cover. He [place] out of state and reports support is very limited in the local area.  Clinical Social Worker also phoned pt and attempted to assess needs. Pt not open to MRI at [facility] due to no valet available at that facility. CSW discussed other transportation options, such as [facility] and [facility] and she will consider those, but today declines those services. CSW discussed safety issues around her driving due to her h/o seizures. Pt stated "I am taking my medicine and won't have any".  CSW inquired if she needed a wheelchair or walker at home or possibly PT and she declined those as well. She stated her cousin was coming to help later today and she did not need other help currently. Pt was open to CSW calling the next day to check in and CSW will follow.  Clinical Social Work interventions: Resource education Supportive listening  Loren Racer, [place] Worker [place]  [place] Phone: [phone] Fax: [phone]

## 2014-07-25 NOTE — Telephone Encounter (Signed)
Documentation by collaborative nurse that provider has spoken wit this patient.

## 2014-07-25 NOTE — Telephone Encounter (Signed)
Per Dr. Johny Shears order phoned patient at home and explained that Dr. [name] has [facility] [address] [facility]. Encouraged her to pick up this script as soon as possible and begin taking. Patient asked this RN to hold while she look to see if she had any of "those pills from the last time." Seconds later this RN heard the patient yelling hello in the background. This went on for some time before the patient returned to the phone. Then, patient states,"I think I am going crazy I was talking into that thing you turn the TV on with." Confirmed patient was talking into her remote. Difficulty finding words noted. Questioned if anyone is there with the patient. She confirms her cousin is on the way to help her. Patient states, "someone called me and told me to go to [facility] for a scan but, I am not going." Patient went on to explain she would prefer her scan be done at [facility]. Informed Mont Dutton of this finding.

## 2014-07-29 ENCOUNTER — Ambulatory Visit
Admission: RE | Admit: 2014-07-29 | Discharge: 2014-07-29 | Disposition: A | Discharge status: Home / Self Care | Payer: Medicare Other | Source: Ambulatory Visit | Attending: Radiation Oncology | Admitting: Radiation Oncology

## 2014-07-29 DIAGNOSIS — C7931 Secondary malignant neoplasm of brain: Secondary | ICD-10-CM

## 2014-07-29 MED ORDER — GADOBENATE DIMEGLUMINE 529 MG/ML IV SOLN: 16.0000 mL | Freq: Once | INTRAVENOUS | Status: AC | PRN

## 2014-07-30 ENCOUNTER — Telehealth: Payer: Self-pay | Admitting: Radiation Oncology

## 2014-07-30 ENCOUNTER — Encounter: Payer: Self-pay | Admitting: Radiation Therapy

## 2014-07-30 NOTE — Progress Notes (Signed)
I called Michelle Cobb this morning to make sure that she is taking the steroid RX recently given to her by Dr. [name]. She verified that she is.  [name]

## 2014-07-30 NOTE — Telephone Encounter (Signed)
Spoke with patient this morning. Patient verbalized she is willing to allow a home health agency into her home to assist with medication management and aid next week. Patient verbalized she "wants to rest this week only." Will request an order from Dr. [name] for a home health aid to manage medications.

## 2014-07-31 ENCOUNTER — Encounter: Payer: Self-pay | Admitting: Radiation Oncology

## 2014-07-31 NOTE — Progress Notes (Signed)
Expand All Collapse All   Location/Histology of Brain Tumor: Hemorrhagic lesion left parietal lobe has increased in size with increase in surrounding edema.  New lesion over the left frontal convexity measuring 4 x 6.6 mm.  Right high parietal lesion is similar in size but slightly increased edema since the prior study. Other lesions are stable.  Patient presented with symptoms TC:YELYHTMB SRS on 05/09/2014. No seizure activity followed SRS until March 1.  Past or anticipated interventions, if any, per neurosurgery: no  Past or anticipated interventions, if any, per medical oncology: patient presented to the ED on 3/1 following a 5 minute long focal seizure  Dose of Decadron, if applicable: decadron 4 mg taper  Recent neurologic symptoms, if any:   Seizures: yes, focal  Headaches: no  Nausea: no  Dizziness/ataxia: no  Difficulty with hand coordination: no  Focal numbness/weakness: yes  Visual deficits/changes: no  Confusion/Memory deficits: no  Painful bone metastases at present, if any: no  SAFETY ISSUES:  Prior radiation? yes  Pacemaker/ICD? no  Possible current pregnancy? no  Is the patient on methotrexate? no  Additional Complaints / other details: 74 year old african [place] female. Single.

## 2014-08-01 ENCOUNTER — Ambulatory Visit
Admission: RE | Admit: 2014-08-01 | Discharge: 2014-08-01 | Disposition: A | Discharge status: Home / Self Care | Payer: Medicare Other | Source: Ambulatory Visit | Attending: Radiation Oncology | Admitting: Radiation Oncology

## 2014-08-01 ENCOUNTER — Emergency Department (HOSPITAL_COMMUNITY): Payer: Medicare Other

## 2014-08-01 ENCOUNTER — Encounter: Payer: Self-pay | Admitting: *Deleted

## 2014-08-01 ENCOUNTER — Inpatient Hospital Stay (HOSPITAL_COMMUNITY)
Admission: EM | Admit: 2014-08-01 | Discharge: 2014-08-07 | DRG: 871 | Disposition: A | Discharge status: Skilled Nursing Facility | Payer: Medicare Other | Attending: Internal Medicine | Admitting: Internal Medicine

## 2014-08-01 ENCOUNTER — Encounter: Payer: Self-pay | Admitting: Radiation Oncology

## 2014-08-01 ENCOUNTER — Other Ambulatory Visit: Payer: Self-pay

## 2014-08-01 ENCOUNTER — Encounter (HOSPITAL_COMMUNITY): Payer: Self-pay

## 2014-08-01 VITALS — BP 114/67 | HR 115 | Temp 98.5°F | Resp 16 | Ht 69.0 in | Wt 177.0 lb

## 2014-08-01 DIAGNOSIS — J189 Pneumonia, unspecified organism: Secondary | ICD-10-CM | POA: Diagnosis present

## 2014-08-01 DIAGNOSIS — Z90710 Acquired absence of both cervix and uterus: Secondary | ICD-10-CM

## 2014-08-01 DIAGNOSIS — A419 Sepsis, unspecified organism: Secondary | ICD-10-CM | POA: Diagnosis not present

## 2014-08-01 DIAGNOSIS — R05 Cough: Secondary | ICD-10-CM

## 2014-08-01 DIAGNOSIS — G4089 Other seizures: Secondary | ICD-10-CM | POA: Diagnosis present

## 2014-08-01 DIAGNOSIS — D509 Iron deficiency anemia, unspecified: Secondary | ICD-10-CM | POA: Diagnosis present

## 2014-08-01 DIAGNOSIS — R112 Nausea with vomiting, unspecified: Secondary | ICD-10-CM | POA: Diagnosis not present

## 2014-08-01 DIAGNOSIS — C3432 Malignant neoplasm of lower lobe, left bronchus or lung: Secondary | ICD-10-CM | POA: Diagnosis present

## 2014-08-01 DIAGNOSIS — A599 Trichomoniasis, unspecified: Secondary | ICD-10-CM | POA: Diagnosis present

## 2014-08-01 DIAGNOSIS — E876 Hypokalemia: Secondary | ICD-10-CM | POA: Diagnosis present

## 2014-08-01 DIAGNOSIS — K529 Noninfective gastroenteritis and colitis, unspecified: Secondary | ICD-10-CM | POA: Diagnosis present

## 2014-08-01 DIAGNOSIS — R569 Unspecified convulsions: Secondary | ICD-10-CM

## 2014-08-01 DIAGNOSIS — Z87891 Personal history of nicotine dependence: Secondary | ICD-10-CM

## 2014-08-01 DIAGNOSIS — E785 Hyperlipidemia, unspecified: Secondary | ICD-10-CM | POA: Diagnosis present

## 2014-08-01 DIAGNOSIS — C7931 Secondary malignant neoplasm of brain: Secondary | ICD-10-CM

## 2014-08-01 DIAGNOSIS — T380X5A Adverse effect of glucocorticoids and synthetic analogues, initial encounter: Secondary | ICD-10-CM | POA: Diagnosis present

## 2014-08-01 DIAGNOSIS — C7949 Secondary malignant neoplasm of other parts of nervous system: Principal | ICD-10-CM

## 2014-08-01 DIAGNOSIS — Z66 Do not resuscitate: Secondary | ICD-10-CM | POA: Diagnosis present

## 2014-08-01 DIAGNOSIS — J9811 Atelectasis: Secondary | ICD-10-CM | POA: Diagnosis present

## 2014-08-01 DIAGNOSIS — Z86718 Personal history of other venous thrombosis and embolism: Secondary | ICD-10-CM

## 2014-08-01 DIAGNOSIS — I82409 Acute embolism and thrombosis of unspecified deep veins of unspecified lower extremity: Secondary | ICD-10-CM

## 2014-08-01 DIAGNOSIS — R531 Weakness: Secondary | ICD-10-CM

## 2014-08-01 DIAGNOSIS — R197 Diarrhea, unspecified: Secondary | ICD-10-CM

## 2014-08-01 DIAGNOSIS — I10 Essential (primary) hypertension: Secondary | ICD-10-CM | POA: Diagnosis present

## 2014-08-01 DIAGNOSIS — N390 Urinary tract infection, site not specified: Secondary | ICD-10-CM | POA: Diagnosis present

## 2014-08-01 DIAGNOSIS — E43 Unspecified severe protein-calorie malnutrition: Secondary | ICD-10-CM | POA: Diagnosis present

## 2014-08-01 DIAGNOSIS — Z6826 Body mass index (BMI) 26.0-26.9, adult: Secondary | ICD-10-CM

## 2014-08-01 DIAGNOSIS — Z515 Encounter for palliative care: Secondary | ICD-10-CM | POA: Diagnosis not present

## 2014-08-01 DIAGNOSIS — R059 Cough, unspecified: Secondary | ICD-10-CM | POA: Diagnosis present

## 2014-08-01 HISTORY — DX: Unspecified convulsions: R56.9

## 2014-08-01 LAB — CBC
HEMATOCRIT: 35.3 % — AB (ref 36.0–46.0)
Hemoglobin: 11.9 g/dL — ABNORMAL LOW (ref 12.0–15.0)
MCH: 25.4 pg — ABNORMAL LOW (ref 26.0–34.0)
MCHC: 33.7 g/dL (ref 30.0–36.0)
MCV: 75.3 fL — ABNORMAL LOW (ref 78.0–100.0)
PLATELETS: 324 10*3/uL (ref 150–400)
RBC: 4.69 MIL/uL (ref 3.87–5.11)
RDW: 17.4 % — AB (ref 11.5–15.5)
WBC: 21.8 10*3/uL — AB (ref 4.0–10.5)

## 2014-08-01 LAB — COMPREHENSIVE METABOLIC PANEL
ALBUMIN: 2.9 g/dL — AB (ref 3.5–5.2)
ALT: 19 U/L (ref 0–35)
AST: 37 U/L (ref 0–37)
Alkaline Phosphatase: 61 U/L (ref 39–117)
Anion gap: 13 (ref 5–15)
BILIRUBIN TOTAL: 1.5 mg/dL — AB (ref 0.3–1.2)
BUN: 18 mg/dL (ref 6–23)
CALCIUM: 9.1 mg/dL (ref 8.4–10.5)
CHLORIDE: 97 mmol/L (ref 96–112)
CO2: 27 mmol/L (ref 19–32)
CREATININE: 1.13 mg/dL — AB (ref 0.50–1.10)
GFR calc Af Amer: 54 mL/min — ABNORMAL LOW (ref >90)
GFR calc non Af Amer: 47 mL/min — ABNORMAL LOW (ref >90)
GLUCOSE: 94 mg/dL (ref 70–99)
POTASSIUM: 3.4 mmol/L — AB (ref 3.5–5.1)
Sodium: 137 mmol/L (ref 135–145)
TOTAL PROTEIN: 7.9 g/dL (ref 6.0–8.3)

## 2014-08-01 LAB — I-STAT TROPONIN, ED: TROPONIN I, POC: 0.02 ng/mL (ref 0.00–0.08)

## 2014-08-01 MED ORDER — VANCOMYCIN HCL IN DEXTROSE 1-5 GM/200ML-% IV SOLN
1000.0000 mg | Freq: Once | INTRAVENOUS | Status: AC
  Administered 2014-08-01: 1000 mg via INTRAVENOUS
  Filled 2014-08-01: qty 200

## 2014-08-01 MED ORDER — VANCOMYCIN HCL IN DEXTROSE 750-5 MG/150ML-% IV SOLN
750.0000 mg | Freq: Two times a day (BID) | INTRAVENOUS | Status: DC
  Administered 2014-08-02 – 2014-08-04 (×5): 750 mg via INTRAVENOUS
  Filled 2014-08-01 (×6): qty 150

## 2014-08-01 MED ORDER — SODIUM CHLORIDE 0.9 % IV SOLN
INTRAVENOUS | Status: DC
  Administered 2014-08-01: 50 mL/h via INTRAVENOUS

## 2014-08-01 MED ORDER — HYDROCODONE-HOMATROPINE 5-1.5 MG/5ML PO SYRP
5.0000 mL | ORAL_SOLUTION | Freq: Four times a day (QID) | ORAL | Status: DC | PRN
  Administered 2014-08-02 – 2014-08-06 (×9): 5 mL via ORAL
  Filled 2014-08-01 (×9): qty 5

## 2014-08-01 MED ORDER — SODIUM CHLORIDE 0.9 % IJ SOLN
3.0000 mL | Freq: Two times a day (BID) | INTRAMUSCULAR | Status: DC
  Administered 2014-08-02 – 2014-08-06 (×6): 3 mL via INTRAVENOUS

## 2014-08-01 MED ORDER — VITAMIN D3 25 MCG (1000 UNIT) PO TABS
1000.0000 [IU] | ORAL_TABLET | Freq: Every day | ORAL | Status: DC
  Administered 2014-08-03 – 2014-08-07 (×5): 1000 [IU] via ORAL
  Filled 2014-08-01 (×6): qty 1

## 2014-08-01 MED ORDER — DRONABINOL 2.5 MG PO CAPS
2.5000 mg | ORAL_CAPSULE | Freq: Two times a day (BID) | ORAL | Status: DC
  Administered 2014-08-02 – 2014-08-07 (×10): 2.5 mg via ORAL
  Filled 2014-08-01 (×11): qty 1

## 2014-08-01 MED ORDER — SODIUM CHLORIDE 0.9 % IV BOLUS (SEPSIS)
1000.0000 mL | INTRAVENOUS | Status: AC
  Administered 2014-08-01 (×2): 1000 mL via INTRAVENOUS

## 2014-08-01 MED ORDER — FENTANYL CITRATE 0.05 MG/ML IJ SOLN
25.0000 ug | Freq: Once | INTRAMUSCULAR | Status: AC
  Administered 2014-08-01: 25 ug via INTRAVENOUS
  Filled 2014-08-01: qty 2

## 2014-08-01 MED ORDER — SODIUM CHLORIDE 0.9 % IJ SOLN
10.0000 mL | Freq: Once | INTRAMUSCULAR | Status: AC
  Administered 2014-08-01: 10 mL via INTRAVENOUS

## 2014-08-01 MED ORDER — OXYCODONE-ACETAMINOPHEN 5-325 MG PO TABS: 1.0000 | ORAL_TABLET | Freq: Four times a day (QID) | ORAL | Status: DC | PRN

## 2014-08-01 MED ORDER — MIRTAZAPINE 15 MG PO TABS
15.0000 mg | ORAL_TABLET | Freq: Every day | ORAL | Status: DC
  Administered 2014-08-02 – 2014-08-06 (×5): 15 mg via ORAL
  Filled 2014-08-01 (×6): qty 1

## 2014-08-01 MED ORDER — DEXAMETHASONE 4 MG PO TABS
4.0000 mg | ORAL_TABLET | Freq: Three times a day (TID) | ORAL | Status: DC
  Administered 2014-08-02 – 2014-08-07 (×16): 4 mg via ORAL
  Filled 2014-08-01 (×17): qty 1

## 2014-08-01 MED ORDER — OSELTAMIVIR PHOSPHATE 75 MG PO CAPS
75.0000 mg | ORAL_CAPSULE | Freq: Two times a day (BID) | ORAL | Status: DC
  Administered 2014-08-02: 75 mg via ORAL
  Filled 2014-08-01 (×2): qty 1

## 2014-08-01 MED ORDER — ACETAMINOPHEN 325 MG PO TABS
650.0000 mg | ORAL_TABLET | Freq: Four times a day (QID) | ORAL | Status: DC | PRN
  Administered 2014-08-07: 650 mg via ORAL
  Filled 2014-08-01 (×2): qty 2

## 2014-08-01 MED ORDER — ENOXAPARIN SODIUM 150 MG/ML ~~LOC~~ SOLN: 1.5000 mg/kg | SUBCUTANEOUS | Status: DC

## 2014-08-01 MED ORDER — ALBUTEROL SULFATE (2.5 MG/3ML) 0.083% IN NEBU: 2.5000 mg | INHALATION_SOLUTION | RESPIRATORY_TRACT | Status: AC | PRN

## 2014-08-01 MED ORDER — FOLIC ACID 1 MG PO TABS
1.0000 mg | ORAL_TABLET | Freq: Every day | ORAL | Status: DC
  Administered 2014-08-03 – 2014-08-07 (×5): 1 mg via ORAL
  Filled 2014-08-01 (×6): qty 1

## 2014-08-01 MED ORDER — SODIUM CHLORIDE 0.9 % IV BOLUS (SEPSIS)
500.0000 mL | INTRAVENOUS | Status: AC
  Administered 2014-08-01: 500 mL via INTRAVENOUS

## 2014-08-01 MED ORDER — ONDANSETRON HCL 4 MG/2ML IJ SOLN
4.0000 mg | Freq: Once | INTRAMUSCULAR | Status: AC
  Administered 2014-08-01: 4 mg via INTRAVENOUS
  Filled 2014-08-01: qty 2

## 2014-08-01 MED ORDER — PIPERACILLIN-TAZOBACTAM 3.375 G IVPB
3.3750 g | Freq: Three times a day (TID) | INTRAVENOUS | Status: DC
  Filled 2014-08-01: qty 50

## 2014-08-01 MED ORDER — SODIUM CHLORIDE 0.9 % IV BOLUS (SEPSIS)
1000.0000 mL | Freq: Once | INTRAVENOUS | Status: AC
  Administered 2014-08-01: 1000 mL via INTRAVENOUS

## 2014-08-01 MED ORDER — ATORVASTATIN CALCIUM 10 MG PO TABS
10.0000 mg | ORAL_TABLET | Freq: Every day | ORAL | Status: DC
  Administered 2014-08-02 – 2014-08-06 (×5): 10 mg via ORAL
  Filled 2014-08-01 (×6): qty 1

## 2014-08-01 MED ORDER — POTASSIUM CHLORIDE 20 MEQ/15ML (10%) PO SOLN: 40.0000 meq | Freq: Once | ORAL | Status: DC

## 2014-08-01 MED ORDER — VANCOMYCIN HCL IN DEXTROSE 1-5 GM/200ML-% IV SOLN: 1000.0000 mg | Freq: Three times a day (TID) | INTRAVENOUS | Status: DC

## 2014-08-01 MED ORDER — LORAZEPAM 2 MG/ML IJ SOLN
1.0000 mg | Freq: Once | INTRAMUSCULAR | Status: AC
  Administered 2014-08-01: 1 mg via INTRAVENOUS
  Filled 2014-08-01: qty 1

## 2014-08-01 MED ORDER — HYDROMORPHONE HCL 1 MG/ML IJ SOLN
1.0000 mg | INTRAMUSCULAR | Status: AC | PRN
Start: 2014-08-01 — End: 2014-08-02
  Administered 2014-08-02: 1 mg via INTRAVENOUS
  Filled 2014-08-01: qty 1

## 2014-08-01 MED ORDER — DRONABINOL 2.5 MG PO CAPS: 2.5000 mg | ORAL_CAPSULE | Freq: Two times a day (BID) | ORAL | Status: AC

## 2014-08-01 MED ORDER — ACETAMINOPHEN 650 MG RE SUPP
650.0000 mg | RECTAL | Status: DC | PRN
  Filled 2014-08-01: qty 1

## 2014-08-01 MED ORDER — SODIUM CHLORIDE 0.9 % IV SOLN
INTRAVENOUS | Status: DC
  Administered 2014-08-02 – 2014-08-04 (×6): via INTRAVENOUS

## 2014-08-01 MED ORDER — LEVETIRACETAM 500 MG PO TABS
1000.0000 mg | ORAL_TABLET | Freq: Two times a day (BID) | ORAL | Status: DC
  Administered 2014-08-02 – 2014-08-07 (×12): 1000 mg via ORAL
  Filled 2014-08-01 (×12): qty 2

## 2014-08-01 MED ORDER — HYDROCORTISONE NA SUCCINATE PF 100 MG IJ SOLR
50.0000 mg | Freq: Two times a day (BID) | INTRAMUSCULAR | Status: DC
  Administered 2014-08-02 – 2014-08-03 (×5): 50 mg via INTRAVENOUS
  Filled 2014-08-01 (×6): qty 1

## 2014-08-01 MED ORDER — ONDANSETRON HCL 4 MG/2ML IJ SOLN: 4.0000 mg | Freq: Three times a day (TID) | INTRAMUSCULAR | Status: AC | PRN

## 2014-08-01 MED ORDER — PIPERACILLIN-TAZOBACTAM 3.375 G IVPB 30 MIN
3.3750 g | Freq: Once | INTRAVENOUS | Status: AC
  Administered 2014-08-01: 3.375 g via INTRAVENOUS
  Filled 2014-08-01: qty 50

## 2014-08-01 NOTE — ED Notes (Signed)
Bed: YJ09 Expected date:  Expected time:  Means of arrival:  Comments: EMS- CA Pt, SOB, n/v

## 2014-08-01 NOTE — ED Provider Notes (Signed)
CSN: [id number]     Arrival date & time 08/01/14  1828 History   First MD Initiated Contact with Patient 08/01/14 1845     Chief Complaint  Patient presents with  . Shortness of Breath  . Lung Cancer  . Brain Tumor  . Nausea  . Emesis     HPI  Patient presents with concern of worsening cough, nausea, weakness, generalized discomfort. Patient states that she always has pain, and her diffuse pain has increased today. Concern, the patient has also developed nausea, cough, fever over the course of the day. Multiple episodes of vomiting occurred prior to arrival. The patient was at outpatient MRI for further evaluation of metastatic brain disease, and soon thereafter developed her symptoms. She denies confusion, disorientation. No clear alleviating or exacerbating factors.    Past Medical History  Diagnosis Date  . Hypertension   . Hyperlipidemia   . Knee injury 1983    left  . Lung cancer dx'd 03/2014  . Brain cancer   . Seizures    Past Surgical History  Procedure Laterality Date  . Video bronchoscopy Bilateral 04/19/2014    Procedure: VIDEO BRONCHOSCOPY WITHOUT FLUORO;  Surgeon: Rigoberto Noel, MD;  Location: [place];  Service: Cardiopulmonary;  Laterality: Bilateral;  . Abdominal hysterectomy  1983   Family History  Problem Relation Age of Onset  . Cancer Sister 37    unknown detail    History  Substance Use Topics  . Smoking status: Former Research scientist (life sciences)  . Smokeless tobacco: Not on file  . Alcohol Use: Yes     Comment: 2 oz/week   OB History    No data available     Review of Systems  Constitutional:       Per HPI, otherwise negative  HENT:       Per HPI, otherwise negative  Respiratory:       Per HPI, otherwise negative  Cardiovascular:       Per HPI, otherwise negative  Gastrointestinal: Positive for nausea and vomiting.  Endocrine:       Negative aside from HPI  Genitourinary:       Neg aside from HPI   Musculoskeletal:       Per HPI, otherwise  negative  Skin: Negative.   Neurological: Negative for syncope.      Allergies  Review of patient's allergies indicates no known allergies.  Home Medications   Prior to Admission medications   Medication Sig Start Date End Date Taking? Authorizing Provider  atorvastatin (LIPITOR) 10 MG tablet Take 10 mg by mouth at bedtime.  02/18/14   Historical Provider, MD  cholecalciferol (VITAMIN D) 1000 UNITS tablet Take 1,000 Units by mouth daily.    Historical Provider, MD  dexamethasone (DECADRON) 4 MG tablet Take 1 tablet (4 mg total) by mouth 3 (three) times daily. TID for 3 days, then BID 1 week, then 2 mg bid 5 days, 2 mg daily 5days,then stop 07/24/14   Tyler Pita, MD  dronabinol (MARINOL) 2.5 MG capsule Take 1 capsule (2.5 mg total) by mouth 2 (two) times daily before a meal. 08/01/14   Tyler Pita, MD  enoxaparin (LOVENOX) 150 MG/ML injection Inject 0.89 mLs (135 mg total) into the skin daily. 07/22/14   Truitt Merle, MD  enoxaparin (LOVENOX) 80 MG/0.8ML injection  06/28/14   Historical Provider, MD  folic acid (FOLVITE) 1 MG tablet Take 1 mg by mouth daily.  05/23/14   Historical Provider, MD  HYDROcodone-homatropine (HYCODAN) 5-1.5 MG/5ML syrup Take  5 mLs by mouth every 6 (six) hours as needed for cough. Patient not taking: Reported on 07/23/2014 07/15/14   Truitt Merle, MD  levETIRAcetam (KEPPRA) 1000 MG tablet Take 1,000 mg by mouth 2 (two) times daily. 07/09/14   Historical Provider, MD  levETIRAcetam (KEPPRA) 750 MG tablet Take 2 tablets (1,500 mg total) by mouth 2 (two) times daily. Patient not taking: Reported on 08/01/2014 07/23/14   Debby Freiberg, MD  mirtazapine (REMERON) 15 MG tablet Take 1 tablet (15 mg total) by mouth at bedtime. 07/15/14   Truitt Merle, MD  ondansetron (ZOFRAN) 8 MG tablet Take 1 tablet (8 mg total) by mouth every 8 (eight) hours as needed for nausea or vomiting. 05/23/14   Truitt Merle, MD  oxyCODONE-acetaminophen (PERCOCET/ROXICET) 5-325 MG per tablet Take 1-2 tablets by mouth  every 6 (six) hours as needed for severe pain. Patient not taking: Reported on 08/01/2014 06/19/14   Tyler Pita, MD  potassium chloride SA (K-DUR,KLOR-CON) 20 MEQ tablet Take 1 tablet (20 mEq total) by mouth 2 (two) times daily. 07/15/14   Truitt Merle, MD  PRESCRIPTION MEDICATION Chemotherapy.    Historical Provider, MD   BP 91/62 mmHg  Pulse 145  Temp(Src) 101.2 F (38.4 C) (Rectal)  Resp 31  Ht 5\' 9"  (1.753 m)  Wt 175 lb (79.379 kg)  BMI 25.83 kg/m2  SpO2 94% Physical Exam  Constitutional: She is oriented to person, place, and time. She appears well-developed and well-nourished. No distress.  HENT:  Head: Normocephalic and atraumatic.  Eyes: Conjunctivae and EOM are normal.  Cardiovascular: Regular rhythm and normal pulses.  Tachycardia present.   Pulmonary/Chest: Accessory muscle usage present. No stridor. Tachypnea noted. She has decreased breath sounds.  Abdominal: She exhibits no distension.  Musculoskeletal: She exhibits no edema.  Neurological: She is alert and oriented to person, place, and time. She displays no atrophy and no tremor. No cranial nerve deficit. She exhibits normal muscle tone. She displays no seizure activity.  Skin: Skin is warm and dry.  Psychiatric: She has a normal mood and affect. Her speech is not rapid and/or pressured, not delayed and not tangential. She is not actively hallucinating. Cognition and memory are not impaired.  Nursing note and vitals reviewed.   ED Course  Procedures (including critical care time) Labs Review Labs Reviewed  CBC - Abnormal; Notable for the following:    WBC 21.8 (*)    Hemoglobin 11.9 (*)    HCT 35.3 (*)    MCV 75.3 (*)    MCH 25.4 (*)    RDW 17.4 (*)    All other components within normal limits  CULTURE, BLOOD (ROUTINE X 2)  CULTURE, BLOOD (ROUTINE X 2)  COMPREHENSIVE METABOLIC PANEL  I-STAT CG4 LACTIC ACID, ED  I-STAT TROPOININ, ED  I-STAT CG4 LACTIC ACID, ED    Imaging Review No results found.   EKG  Interpretation   Date/Time:  Thursday August 01 2014 18:28:47 EST Ventricular Rate:  140 PR Interval:  87 QRS Duration: 78 QT Interval:  272 QTC Calculation: 415 R Axis:   37 Text Interpretation:  Sinus tachycardia Ventricular premature complex  Aberrant complex Probable left atrial enlargement Repolarization  abnormality, prob rate related Sinus tachycardia T wave abnormality  Abnormal ekg Confirmed by Carmin Muskrat  MD [phone]) on 08/01/2014 7:06:56  PM     With patient's initial tachycardia, fever, tachypnea, there was immediate concern for sepsis. Patient received 500 mL bolus, was scheduled for 30 mL/kg, 2.5 L resuscitation. Patient received  Tylenol. Empiric antibiotics ordered. I discussed patient's case with pharmacy.  I reviewed the patient's chart, including MRI imaging from earlier today, with increased metastatic disease and her brain.  Has reviewed the patient's visit from one week ago, which was seen by a colleague, for focal seizure activity.   On repeat exam the patient continues to be in some discomfort, she is tachycardic, 140s, but not hypoxic, not altered.  Patient's labs notable for leukocytosis, minor elevation in creatinine.  X-ray demonstrates left-sided pleural effusion.   On repeat exam patient remains awake, comfortable.  I discussed resuscitation goals with her. She confirms that she is a DO NOT RESUSCITATE.  MDM   Final diagnoses:  Sepsis, due to unspecified organism   patient presents with multiple complaints, including generalized weakness, cough, ongoing pain in multiple areas. Patient's labs are immediately notable for leukocytosis, and her vitals notable for tachycardia, tachypnea, fever or CONCERNING for sepsis. Patient received empiric fluid resuscitation, broad-spectrum antibiotics.  Patient's x-ray showed left-sided pleural effusion, possible pneumonia, fits her story of cough, fever, nausea, fatigue. Patient also has substantial  healthcare interactivity, with ongoing therapy for lung cancer. After initial resuscitation patient continued to have pain, received additional analgesics, antiemetics. Patient required admission for further evaluation and management of her sepsis.   CRITICAL CARE Performed by: Carmin Muskrat Total critical care time: 35 Critical care time was exclusive of separately billable procedures and treating other patients. Critical care was necessary to treat or prevent imminent or life-threatening deterioration. Critical care was time spent personally by me on the following activities: development of treatment plan with patient and/or surrogate as well as nursing, discussions with consultants, evaluation of patient's response to treatment, examination of patient, obtaining history from patient or surrogate, ordering and performing treatments and interventions, ordering and review of laboratory studies, ordering and review of radiographic studies, pulse oximetry and re-evaluation of patient's condition.   Carmin Muskrat, MD 08/02/14 [phone]

## 2014-08-01 NOTE — Progress Notes (Signed)
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 Brain metastasis 198.3 C79.31     Interval Since Last Radiation:  10  weeks  Narrative:  The patient returns today for routine follow-up.  Patient reports that she has not increased her Keppra. She reports Dr. Mina Marble instructed her to continue Keppra 1000 mg bid. Patient denies any seizure activity since 3/2. Reports taking decadron taper as directed; she will begin 2 mg once per day for five days today. Biggest complaint today is nausea. Denies vomiting. Reports zofran doesn't help to relieve nausea. Reports using her Lovenox once per day instead of twice because she can't afford the refill. Reports this morning she only took her bp medication and keppra because she hasn't been able to eat due to nausea. Reports she is very weak. Riding in wheelchair today. Denies pain. Denies headache or dizziness. Denies diplopia or ringing in the ears. Heart rate elevated.                              ALLERGIES:  has No Known Allergies.  Meds: Current Outpatient Prescriptions  Medication Sig Dispense Refill  . atorvastatin (LIPITOR) 10 MG tablet Take 10 mg by mouth at bedtime.   0  . cholecalciferol (VITAMIN D) 1000 UNITS tablet Take 1,000 Units by mouth daily.    Marland Kitchen dexamethasone (DECADRON) 4 MG tablet Take 1 tablet (4 mg total) by mouth 3 (three) times daily. TID for 3 days, then BID 1 week, then 2 mg bid 5 days, 2 mg daily 5days,then stop 60 tablet 2  . enoxaparin (LOVENOX) 150 MG/ML  injection Inject 0.89 mLs (135 mg total) into the skin daily. 1 mL 0  . folic acid (FOLVITE) 1 MG tablet Take 1 mg by mouth daily.   3  . levETIRAcetam (KEPPRA) 1000 MG tablet Take 1,000 mg by mouth 2 (two) times daily.  2  . mirtazapine (REMERON) 15 MG tablet Take 1 tablet (15 mg total) by mouth at bedtime. 30 tablet 2  . ondansetron (ZOFRAN) 8 MG tablet Take 1 tablet (8 mg total) by mouth every 8 (eight) hours as needed for nausea or vomiting. 20 tablet 0  . potassium chloride SA (K-DUR,KLOR-CON) 20 MEQ tablet Take 1 tablet (20 mEq total) by mouth 2 (two) times daily. 60 tablet 1  . PRESCRIPTION MEDICATION Chemotherapy.    . enoxaparin (LOVENOX) 80 MG/0.8ML injection   1  . HYDROcodone-homatropine (HYCODAN) 5-1.5 MG/5ML syrup Take 5 mLs by mouth every 6 (six) hours as needed for cough. (Patient not taking: Reported on 07/23/2014) 300 mL 0  . levETIRAcetam (KEPPRA) 750 MG tablet Take 2 tablets (1,500 mg total) by mouth 2 (two) times daily. (Patient not taking: Reported on 08/01/2014) 30 tablet 0  . oxyCODONE-acetaminophen (PERCOCET/ROXICET) 5-325 MG per tablet Take 1-2 tablets by mouth every 6 (six) hours as needed for severe pain. (Patient not taking: Reported on 08/01/2014) 100 tablet 0  No current facility-administered medications for this encounter.    Physical Findings: The patient is in no acute distress. Patient is alert and oriented.  height is 5\' 9"  (1.753 m) and weight is 177 lb (80.287 kg). Her oral temperature is 98.5 F (36.9 C). Her blood pressure is 114/67 and her pulse is 115. Her respiration is 16 and oxygen saturation is 100%. Patient globally weak.  No significant changes.  Lab Findings: Lab Results  Component Value Date   WBC 13.6* 07/23/2014   WBC 12.6* 07/22/2014   HGB 10.2* 07/23/2014   HGB 10.7* 07/22/2014   HCT 30.1* 07/23/2014   HCT 33.3* 07/22/2014   PLT 359 07/23/2014   PLT 331 07/22/2014    Lab Results  Component Value Date   NA 139 07/23/2014   NA 138  07/22/2014   K 3.4* 07/23/2014   K 3.6 07/22/2014   CHLORIDE 97* 07/22/2014   CO2 30 07/23/2014   CO2 25 07/22/2014   GLUCOSE 106* 07/23/2014   GLUCOSE 107 07/22/2014   BUN 8 07/23/2014   BUN 7.0 07/22/2014   CREATININE 0.67 07/23/2014   CREATININE 0.8 07/22/2014   BILITOT 1.10 07/22/2014   BILITOT 1.2 06/24/2014   ALKPHOS 55 07/22/2014   ALKPHOS 110 06/24/2014   AST 15 07/22/2014   AST 16 06/24/2014   ALT 7 07/22/2014   ALT 29 06/24/2014   PROT 7.3 07/22/2014   PROT 7.3 06/24/2014   ALBUMIN 2.3* 07/22/2014   ALBUMIN 3.1* 06/24/2014   CALCIUM 8.6 07/23/2014   CALCIUM 9.2 07/22/2014   ANIONGAP 13 07/23/2014   ANIONGAP 16* 07/22/2014    Radiographic Findings: Dg Chest 1 View  07/18/2014   CLINICAL DATA:  Pain following thoracentesis.  Left-sided effusion.  EXAM: CHEST  1 VIEW  COMPARISON:  CT chest with contrast 07/11/2014.  FINDINGS: The heart size is normal. There is a significant decrease in the left pleural effusion. There is no pneumothorax. The right lung is clear. The visualized soft tissues and bony thorax are otherwise unremarkable.  IMPRESSION: 1. Decreased left pleural effusion following thoracentesis. 2. No significant pneumothorax or other complication evident by radiograph. 3. The right lung is clear.   Electronically Signed   By: San Morelle M.D.   On: 07/18/2014 12:07   Dg Chest 2 View  07/23/2014   CLINICAL DATA:  Focal left arm seizures; known history of brain cancer and lung cancer. Initial encounter.  EXAM: CHEST  2 VIEW  COMPARISON:  Chest radiograph performed 07/18/2014  FINDINGS: There is mildly increased moderate left pleural effusion with associated airspace opacity. The right lung appears clear. There is mild rightward mediastinal shift, likely reflecting the pleural effusion. No pneumothorax is seen. The underlying left lower lobe lung mass is not well characterized on radiograph.  The cardiomediastinal silhouette remains borderline normal in size.  No acute osseous abnormalities are identified.  IMPRESSION: Mildly increased moderate left pleural effusion, with associated airspace opacity. Underlying known lung mass not well characterized on radiograph.   Electronically Signed   By: Garald Balding M.D.   On: 07/23/2014 18:17   Ct Head Wo Contrast  07/23/2014   CLINICAL DATA:  Focal seizure  EXAM: CT HEAD WITHOUT CONTRAST  TECHNIQUE: Contiguous axial images were obtained from the base of the skull through the vertex without intravenous contrast.  COMPARISON:  04/18/2014  FINDINGS: Skull and Sinuses:Negative for fracture or destructive process. The mastoids, middle ears, and imaged paranasal sinuses are clear.  Orbits: Dysconjugate gaze.  Brain: Known  cerebral metastases in the bilateral posterior cerebral hemispheres, parietal on the right and frontal parietal on the left. The masses are cortically based on the right and subcortical on the left. Edema surrounding the left-sided metastasis has increased, but there is no midline shift or herniation, and the metastasis has increased in diameter to 18 mm (13 mm by contrast-enhanced MRI 05/01/2014). The edema tracks into the external and internal capsules posteriorly, making the posterior putamen prominent. Low-attenuation in the bilateral anterior internal capsules is chronic, likely from multiple dilated perivascular spaces and white matter gliosis based on previous brain MRI. There is no evidence of acute hemorrhage. No acute infarct or hydrocephalus.  IMPRESSION: Known brain metastases. Compared to 05/01/2014 brain MRI, a left posterior frontal metastasis has increased size and vasogenic edema.   Electronically Signed   By: Monte Fantasia M.D.   On: 07/23/2014 21:09   Ct Chest W Contrast  07/11/2014   CLINICAL DATA:  Stage IV left lung cancer, after chemotherapy  EXAM: CT CHEST, ABDOMEN, AND PELVIS WITH CONTRAST  TECHNIQUE: Multidetector CT imaging of the chest, abdomen and pelvis was performed following  the standard protocol during bolus administration of intravenous contrast.  CONTRAST:  12mL OMNIPAQUE IOHEXOL 300 MG/ML  SOLN  COMPARISON:  None.  FINDINGS: CT CHEST FINDINGS  Mediastinum/Nodes: Heart is normal in size. No pericardial effusion.  Coronary atherosclerosis in the LAD.  Atherosclerotic calcifications of the aortic arch. Mild ectasia of the ascending thoracic aortic, measuring 3.4 cm.  Small mediastinal nodes, including a 5 mm AP window node in a 6 mm short axis prevascular node. 2.2 x 2.5 cm left hilar node (series 2/image 35), previously 1.8 x 2.6 cm.  Visualized thyroid is unremarkable.  Lungs/Pleura: 5.6 x 4.4 cm area of mass-like consolidation in the left lower lobe (series 2/image 46), corresponding to a prior 8.0 x 4.7 cm mass.  Moderate to large left pleural effusion, partially loculated, with associated pleural-based nodularity (series 2/image 50).  Visualized right lung is clear.  Moderate centrilobular and paraseptal emphysematous changes.  No pneumothorax.  Musculoskeletal: Mild degenerative changes of the thoracic spine.  CT ABDOMEN PELVIS FINDINGS  Hepatobiliary: Possible 8 x 11 mm hypoenhancing lesion in the central right liver anterior to the intrahepatic IVC (series 2/image 54).  2.2 x 1.2 cm hypoenhancing metastasis posteriorly in the lateral segment left hepatic lobe (series 2/image 60), previously 7 mm.  Gallbladder is notable for multiple layering gallstones, without associated inflammatory changes. No intrahepatic or extrahepatic ductal dilatation.  Pancreas: Within normal limits.  Spleen: Within normal limits.  Adrenals/Urinary Tract: Adrenal glands unremarkable.  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  Right kidney is within normal limits.  No hydronephrosis.  Bladder is within normal limits.  Stomach/Bowel: Stomach is unremarkable.  No evidence of bowel obstruction.  Normal appendix.  Vascular/Lymphatic: Atherosclerotic calcifications of  the abdominal aorta and branch vessels.  Fusiform ectasia of the infrarenal abdominal aorta, measuring 2.6 x 2.7 cm (series 2/image 37).  No suspicious abdominopelvic lymphadenopathy.  Reproductive: Status post hysterectomy.  No adnexal masses.  Other: No abdominopelvic ascites.  Musculoskeletal: 8 mm sclerotic lesion in the superior endplate of L4, new, suspicious (sagittal image 71). Additional sclerotic lesion in the sacrum (sagittal image 70), grossly unchanged.  IMPRESSION: Mixed response.  Left lower lobe mass has decreased in size.  Associated mediastinal/left hilar lymphadenopathy is mildly decreased. Moderate to large left pleural effusion, likely malignant, new/increased.  Suspected mild progression of hepatic metastases.  New 8 mm sclerotic lesion at L4, suspicious for osseous metastasis.  Additional ancillary findings as above.   Electronically Signed   By: Julian Hy M.D.   On: 07/11/2014 11:10   Mr Jeri Cos MH Contrast  07/29/2014   CLINICAL DATA:  Metastatic lung cancer. Three month stereotactic radiosurgery follow-up  EXAM: MRI HEAD WITHOUT AND WITH CONTRAST  TECHNIQUE: Multiplanar, multiecho pulse sequences of the brain and surrounding structures were obtained without and with intravenous contrast.  CONTRAST:  16 mL MultiHance IV  COMPARISON:  MRI 05/01/2014  FINDINGS: Multiple metastatic deposits. Increase in edema in the left parietal lobe secondary to enlarging hemorrhagic metastatic deposit. Right parietal edema also has progressed somewhat. Ventricle size remains normal. No significant shift of the midline structures. No acute infarct.  Punctate calcification right cerebellum unchanged without enhancement.  3 mm enhancing nodule right superior cerebellum unchanged consistent with metastatic deposit.  2 mm enhancing nodule central pons unchanged.  19 x 20 mm hemorrhagic lesion left parietal lobe has increased in size since the prior study. Increased surrounding white matter edema.  11 x  13 mm hemorrhagic enhancing lesion high right parietal lobe is similar in size. Slight increase in surrounding edema.  New enhancing lesion left frontal lobe over the convexity measuring 4 x 6.6 mm. This is consistent with metastatic disease and is new.  Calvarium is negative.  Paranasal sinuses clear  IMPRESSION: Multiple metastatic deposits are present as described above.  Hemorrhagic lesion left parietal lobe has increased in size with increase in surrounding edema.  New lesion over the left frontal convexity measuring 4 x 6.6 mm.  Right high parietal lesion is similar in size but slightly increased edema since the prior study. Other lesions are stable.   Electronically Signed   By: Franchot Gallo M.D.   On: 07/29/2014 15:06   Ct Abdomen Pelvis W Contrast  07/11/2014   CLINICAL DATA:  Stage IV left lung cancer, after chemotherapy  EXAM: CT CHEST, ABDOMEN, AND PELVIS WITH CONTRAST  TECHNIQUE: Multidetector CT imaging of the chest, abdomen and pelvis was performed following the standard protocol during bolus administration of intravenous contrast.  CONTRAST:  19mL OMNIPAQUE IOHEXOL 300 MG/ML  SOLN  COMPARISON:  None.  FINDINGS: CT CHEST FINDINGS  Mediastinum/Nodes: Heart is normal in size. No pericardial effusion.  Coronary atherosclerosis in the LAD.  Atherosclerotic calcifications of the aortic arch. Mild ectasia of the ascending thoracic aortic, measuring 3.4 cm.  Small mediastinal nodes, including a 5 mm AP window node in a 6 mm short axis prevascular node. 2.2 x 2.5 cm left hilar node (series 2/image 35), previously 1.8 x 2.6 cm.  Visualized thyroid is unremarkable.  Lungs/Pleura: 5.6 x 4.4 cm area of mass-like consolidation in the left lower lobe (series 2/image 46), corresponding to a prior 8.0 x 4.7 cm mass.  Moderate to large left pleural effusion, partially loculated, with associated pleural-based nodularity (series 2/image 50).  Visualized right lung is clear.  Moderate centrilobular and paraseptal  emphysematous changes.  No pneumothorax.  Musculoskeletal: Mild degenerative changes of the thoracic spine.  CT ABDOMEN PELVIS FINDINGS  Hepatobiliary: Possible 8 x 11 mm hypoenhancing lesion in the central right liver anterior to the intrahepatic IVC (series 2/image 54).  2.2 x 1.2 cm hypoenhancing metastasis posteriorly in the lateral segment left hepatic lobe (series 2/image 60), previously 7 mm.  Gallbladder is notable for multiple layering gallstones, without associated inflammatory changes. No intrahepatic or extrahepatic ductal dilatation.  Pancreas: Within normal limits.  Spleen: Within normal limits.  Adrenals/Urinary Tract: Adrenal glands unremarkable.  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  Right kidney is within normal limits.  No hydronephrosis.  Bladder is within normal limits.  Stomach/Bowel: Stomach is unremarkable.  No evidence of bowel obstruction.  Normal appendix.  Vascular/Lymphatic: Atherosclerotic calcifications of the abdominal aorta and branch vessels.  Fusiform ectasia of the infrarenal abdominal aorta, measuring 2.6 x 2.7 cm (series 2/image 37).  No suspicious abdominopelvic lymphadenopathy.  Reproductive: Status post hysterectomy.  No adnexal masses.  Other: No abdominopelvic ascites.  Musculoskeletal: 8 mm sclerotic lesion in the superior endplate of L4, new, suspicious (sagittal image 71). Additional sclerotic lesion in the sacrum (sagittal image 70), grossly unchanged.  IMPRESSION: Mixed response.  Left lower lobe mass has decreased in size.  Associated mediastinal/left hilar lymphadenopathy is mildly decreased. Moderate to large left pleural effusion, likely malignant, new/increased.  Suspected mild progression of hepatic metastases.  New 8 mm sclerotic lesion at L4, suspicious for osseous metastasis.  Additional ancillary findings as above.   Electronically Signed   By: Julian Hy M.D.   On: 07/11/2014 11:10   US Thoracentesis Asp  Pleural Space W/img Guide  07/18/2014   CLINICAL DATA:  Shortness of breath. Left-sided pleural effusion. Request diagnostic and therapeutic thoracentesis.  EXAM: ULTRASOUND GUIDED LEFT THORACENTESIS  COMPARISON:  None.  PROCEDURE: An ultrasound guided thoracentesis was thoroughly discussed with the patient and questions answered. The benefits, risks, alternatives and complications were also discussed. The patient understands and wishes to proceed with the procedure. Written consent was obtained.  Ultrasound was performed to localize and mark an adequate pocket of fluid in the left chest. The area was then prepped and draped in the normal sterile fashion. 1% Lidocaine was used for local anesthesia. Under ultrasound guidance a 19 gauge Yueh catheter was introduced. Thoracentesis was performed. The catheter was removed and a dressing applied.  COMPLICATIONS: None  FINDINGS: A total of approximately 750 mL of clear, dark amber colored fluid was removed. A fluid sample wassent for laboratory analysis.  IMPRESSION: Successful ultrasound guided left thoracentesis yielding 750 mL of pleural fluid.  Read by: Ascencion Dike PA-C   Electronically Signed   By: Corrie Mckusick D.O.   On: 07/18/2014 11:27    Impression:  The patient has a new subcentimeter brain metastasis amenable to radiotherapy.  Plan:  We discussed SRS for salvage, surveillance, and even hospice.  She would like to pursue SRS.  Her appetite and nutrition has not been good.  Will try marinol.  _____________________________________  [name]. [name], M.D.

## 2014-08-01 NOTE — Progress Notes (Signed)
ANTIBIOTIC CONSULT NOTE - INITIAL  Pharmacy Consult for Vancomycin/Zosyn Indication: sepsis  No Known Allergies  Patient Measurements: Height: 5\' 9"  (175.3 cm) Weight: 175 lb (79.379 kg) IBW/kg (Calculated) : 66.2  Vital Signs: Temp: 101.2 F (38.4 C) (03/10 1833) Temp Source: Rectal (03/10 1833) BP: 91/62 mmHg (03/10 1854) Pulse Rate: 145 (03/10 1854) Intake/Output from previous day:   Intake/Output from this shift:    Labs:  Recent Labs  08/01/14 1854  WBC 21.8*  HGB 11.9*  PLT 324   Estimated Creatinine Clearance: 65.5 mL/min (by C-G formula based on Cr of 0.67). No results for input(s): VANCOTROUGH, VANCOPEAK, VANCORANDOM, GENTTROUGH, GENTPEAK, GENTRANDOM, TOBRATROUGH, TOBRAPEAK, TOBRARND, AMIKACINPEAK, AMIKACINTROU, AMIKACIN in the last 72 hours.   Microbiology: Recent Results (from the past 720 hour(s))  Body fluid culture     Status: None   Collection Time: 07/18/14 10:46 AM  Result Value Ref Range Status   Specimen Description PLEURAL LEFT  Final   Special Requests NONE  Final   Gram Stain   Final    RARE WBC PRESENT,BOTH PMN AND MONONUCLEAR NO ORGANISMS SEEN Performed at [facility]    Culture   Final    NO GROWTH 3 DAYS Performed at [facility]    Report Status 07/21/2014 FINAL  Final    Medical History: Past Medical History  Diagnosis Date  . Hypertension   . Hyperlipidemia   . Knee injury 1983    left  . Lung cancer dx'd 03/2014  . Brain cancer   . Seizures      Assessment: 62 yoF with h/o NSLC with brain mets presents with shortness of breath, nausea/vomiting. Patient had outpatient MRI today. Code sepsis called.  Pharmacy contacted EDP 30 minutes after code was alerted to obtain antibiotic orders. Pharmacy consulted to dose Vancomycin and Zosyn.  Vancomycin 1g and Zosyn 3.375g x 1 ordered by EDP.  RN contacted to administer fluid bolus and antibiotics within the hour.  Patient had been transferred to xray at that  time and RN communicated that she would administer antibiotics as soon as patient returns.  Tmax: 101.2 WBC: 21.8 Renal: SCr 1.13, CrCl~55 ml/min (CG), ~50 ml/min (normalized)  Blood cultures x 2 collected.  Goal of Therapy:  Vancomycin trough level 15-20 mcg/ml  Doses adjusted per renal function Eradication of infection  Plan:  1.  Vancomycin 750 mg IV q12h. 2.  Zosyn 3.375g IV q8h (4 hour infusion time).  3.  F/u trough levels, SCr, culture results, clinical course.  Michelle Cobb 08/01/2014,7:31 PM

## 2014-08-01 NOTE — Progress Notes (Signed)
  Radiation Oncology         [phone] ________________________________  Name: Michelle Cobb MRN: [id number]  Date: 08/01/2014  DOB: Dec 15, 1940  SIMULATION AND TREATMENT PLANNING NOTE    ICD-9-CM ICD-10-CM   1. Brain metastasis 198.3 C79.31     DIAGNOSIS:  74 year old woman with an isolated 6.6 mm  NARRATIVE:  The patient was brought to the [facility].  Identity was confirmed.  All relevant records and images related to the planned course of therapy were reviewed.  The patient freely provided informed written consent to proceed with treatment after reviewing the details related to the planned course of therapy. The consent form was witnessed and verified by the simulation staff. Intravenous access was established for contrast administration. Then, the patient was set-up in a stable reproducible supine position for radiation therapy.  A relocatable thermoplastic stereotactic head frame was fabricated for precise immobilization.  CT images were obtained.  Surface markings were placed.  The CT images were loaded into the planning software and fused with the patient's targeting MRI scan.  Then the target and avoidance structures were contoured.  Treatment planning then occurred.  The radiation prescription was entered and confirmed.  I have requested 3D planning  I have requested a DVH of the following structures: Brain stem, brain, left eye, right eye, lenses, optic chiasm, target volumes, uninvolved brain, and normal tissue.    PLAN:  The patient will receive 20 Gy in 1 fraction.  ________________________________  [name] [name], M.D.

## 2014-08-01 NOTE — Progress Notes (Signed)
[place] [facility]  Clinical Social Work phoned pt to follow up on medicaid number in order to submit SCAT application. Pt upset and stated she "had been throwing up all day". Pt stated she had a friend on the way over, but she could not talk right now. CSW inquired if she wanted RN to call her to follow up. Pt stated yes. CSW phoned radonc and relayed message for RN to call and check in on pt.   Loren Racer, [place] Worker [place]  [place] Phone: [phone] Fax: [phone]

## 2014-08-01 NOTE — Patient Instructions (Signed)
Cut back to dexamethasone 2 mg (1/2 pill) once daily on Friday and Saturday, then stop.

## 2014-08-01 NOTE — ED Notes (Signed)
Per [facility]- Pt resides at home. Pt c/o of SOB N/V. Symptoms started after returning home from here for MRI . C/o of NV/D also. Zofran 4 mg given in route. Pt c/o cough

## 2014-08-01 NOTE — Progress Notes (Signed)
See progress note under physician encounter. 

## 2014-08-01 NOTE — Progress Notes (Signed)
Started right AC IV on the first attempt. Excellent blood return obtained. Flushed without complication. Patient tolerated well. Injected contrast for SIM. Removed IV. Applied occlusive dressing to old IV site.

## 2014-08-01 NOTE — H&P (Addendum)
[facility] History and Physical  Michelle Cobb QQP:[id number] DOB: 1941/03/17 DOA: 08/01/2014  Referring physician: ED physician PCP: Woody Seller, MD  Specialists:   Chief Complaint: cough, SOB, nausea, vomiting, diarrhea  HPI: Michelle Cobb is a 74 y.o. 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  Patient reports she started having worsening cough, runny nose, but no sore throat today. She feels whole-body aching and feels very weak. She coughs up some white colored mucus. No significant chest pain. She also has nausea, vomiting and diarrhea. She reports that she has diarrhea with loose stool and vomiting all day today. She does not have significant abdominal pain. Patient has fever and chills.  Patient denies dysuria, urgency, frequency, hematuria, skin rashes or leg swelling. No unilateral weakness, numbness or tingling sensations. No vision change or hearing loss.   In ED, patient was found to have leukocytosis with WBC 21.8, fever with temperature 101.2, soft blood pressure, tachycardia. X-ray showed stable moderate left pleural effusion and adjacent basilar consolidation/atelectasis. Patient is admitted to inpatient for further evaluation and treatment.  Review of Systems: As presented in the history of presenting illness, rest negative.  Where does patient live?  At home Can patient participate in ADLs? none  Allergy: No Known Allergies  Past Medical History  Diagnosis Date  . Hypertension   . Hyperlipidemia   . Knee injury 1983    left  . Lung cancer dx'd 03/2014  . Brain cancer   . Seizures     Past Surgical History  Procedure Laterality  Date  . Video bronchoscopy Bilateral 04/19/2014    Procedure: VIDEO BRONCHOSCOPY WITHOUT FLUORO;  Surgeon: Rigoberto Noel, MD;  Location: [facility];  Service: Cardiopulmonary;  Laterality: Bilateral;  . Abdominal hysterectomy  1983    Social History:  reports that she has quit smoking. She does not have any smokeless tobacco history on file. She reports that she drinks alcohol. She reports that she does not use illicit drugs.  Family History:  Family History  Problem Relation Age of Onset  . Cancer Sister 52    unknown detail      Prior to Admission medications   Medication Sig Start Date End Date Taking? Authorizing Provider  atorvastatin (LIPITOR) 10 MG tablet Take 10 mg by mouth at bedtime.  02/18/14   Historical Provider, MD  cholecalciferol (VITAMIN D) 1000 UNITS tablet Take 1,000 Units by mouth daily.    Historical Provider, MD  dexamethasone (DECADRON) 4 MG tablet Take 1 tablet (4 mg total) by mouth 3 (three) times daily. TID for 3 days, then BID 1 week, then 2 mg bid 5 days, 2 mg daily 5days,then stop 07/24/14   Tyler Pita, MD  dronabinol (MARINOL) 2.5 MG capsule Take 1 capsule (2.5 mg total) by mouth 2 (two) times daily before a meal. 08/01/14   Tyler Pita, MD  enoxaparin (LOVENOX) 150 MG/ML injection Inject 0.89 mLs (135 mg total) into the skin daily. 07/22/14   Truitt Merle, MD  enoxaparin (LOVENOX) 80 MG/0.8ML injection  06/28/14   Historical Provider, MD  folic acid (FOLVITE) 1 MG tablet Take 1 mg by mouth daily.  05/23/14   Historical Provider, MD  HYDROcodone-homatropine (HYCODAN) 5-1.5 MG/5ML syrup Take 5 mLs by mouth every 6 (  six) hours as needed for cough. Patient not taking: Reported on 07/23/2014 07/15/14   Truitt Merle, MD  levETIRAcetam (KEPPRA) 1000 MG tablet Take 1,000 mg by mouth 2 (two) times daily. 07/09/14   Historical Provider, MD  levETIRAcetam (KEPPRA) 750 MG tablet Take 2 tablets (1,500 mg total) by mouth 2 (two) times daily. Patient not taking: Reported on  08/01/2014 07/23/14   Debby Freiberg, MD  mirtazapine (REMERON) 15 MG tablet Take 1 tablet (15 mg total) by mouth at bedtime. 07/15/14   Truitt Merle, MD  ondansetron (ZOFRAN) 8 MG tablet Take 1 tablet (8 mg total) by mouth every 8 (eight) hours as needed for nausea or vomiting. 05/23/14   Truitt Merle, MD  oxyCODONE-acetaminophen (PERCOCET/ROXICET) 5-325 MG per tablet Take 1-2 tablets by mouth every 6 (six) hours as needed for severe pain. Patient not taking: Reported on 08/01/2014 06/19/14   Tyler Pita, MD  potassium chloride SA (K-DUR,KLOR-CON) 20 MEQ tablet Take 1 tablet (20 mEq total) by mouth 2 (two) times daily. 07/15/14   Truitt Merle, MD  PRESCRIPTION MEDICATION Chemotherapy.    Historical Provider, MD    Physical Exam: Filed Vitals:   08/01/14 2000 08/01/14 2030 08/01/14 2100 08/01/14 2130  BP: 92/58 110/60 105/53 98/54  Pulse: 132 129 130 126  Temp:      TempSrc:      Resp: 38 38 43 42  Height:      Weight:      SpO2: 89% 99% 96% 97%   General: Not in acute distress HEENT:       Eyes: PERRL, EOMI, no scleral icterus       ENT: No discharge from the ears and nose, no pharynx injection, no tonsillar enlargement.        Neck: No JVD, no bruit, no mass felt. Cardiac: S1/S2, RRR, No murmurs, No gallops or rubs Pulm: deceased air movement bilaterally, L>R. No No rales, wheezing, rhonchi or rubs. Abd: Soft, nondistended, nontender, no rebound pain, no organomegaly, BS present Ext: No edema bilaterally. 2+DP/PT pulse bilaterally Musculoskeletal: No joint deformities, erythema, or stiffness, ROM full Skin: No rashes.  Neuro: Alert and oriented X3, cranial nerves II-XII grossly intact, muscle strength 5/5 in all extremeties, sensation to light touch intact. B Psych: Patient is not psychotic, no suicidal or hemocidal ideation.  Labs on Admission:  Basic Metabolic Panel:  Recent Labs Lab 08/01/14 1854  NA 137  K 3.4*  CL 97  CO2 27  GLUCOSE 94  BUN 18  CREATININE 1.13*  CALCIUM 9.1    Liver Function Tests:  Recent Labs Lab 08/01/14 1854  AST 37  ALT 19  ALKPHOS 61  BILITOT 1.5*  PROT 7.9  ALBUMIN 2.9*   No results for input(s): LIPASE, AMYLASE in the last 168 hours. No results for input(s): AMMONIA in the last 168 hours. CBC:  Recent Labs Lab 08/01/14 1854  WBC 21.8*  HGB 11.9*  HCT 35.3*  MCV 75.3*  PLT 324   Cardiac Enzymes: No results for input(s): CKTOTAL, CKMB, CKMBINDEX, TROPONINI in the last 168 hours.  BNP (last 3 results) No results for input(s): BNP in the last 8760 hours.  ProBNP (last 3 results) No results for input(s): PROBNP in the last 8760 hours.  CBG: No results for input(s): GLUCAP in the last 168 hours.  Radiological Exams on Admission: Dg Chest 2 View  08/01/2014   CLINICAL DATA:  Metastatic lung ca - cough - SOB - Pt resides at home. Pt c/o of SOB  N/V. Symptoms started after returning home from here for MRI today  EXAM: CHEST - 2 VIEW  COMPARISON:  07/23/2014  FINDINGS: Moderate left pleural effusion. Consolidation/ atelectasis at the left lung base as before. Right lung clear. Heart size difficult to assess due to adjacent opacity. No pneumothorax Visualized skeletal structures are unremarkable.  IMPRESSION: 1. Stable moderate left pleural effusion and adjacent basilar consolidation/atelectasis.   Electronically Signed   By: Lucrezia Europe M.D.   On: 08/01/2014 20:10    EKG: Independently reviewed.   Assessment/Plan Principal Problem:   Sepsis Active Problems:   Hyperlipidemia   Brain metastasis   Focal seizures   DVT (deep venous thrombosis)   Essential hypertension   Hypokalemia   Weakness generalized   Malignant neoplasm of lower lobe of left lung   Cough   Nausea vomiting and diarrhea  Sepsis: It is likely due to multiple etiologies, including possible flu given runny nose and whole body aching, possible PNA and gastroenteritis given nausea, vomiting and diarrhea.  - Will admit to tele bed - Empiric  antibiotics, including vancomycin, cefepime, Tamiflu - Urine legionella and S. pneumococcal antigen - IVF: 2.5 L normal saline bolus, followed by 150 mL per hour cc/h - Hyucodan for cough - Follow up blood culture x2, sputum culture and respiratory virus panel and flu PCR - Zofran for Nausea - Pain control: dilaudid for severe pain, Percocet for moderate pain - stress dose of Solu-Cortef for: 50 mg twice a day (patient is on Decadron at home  Cough and shortness of breath: Possible flu or pneumonia - see above  Nausea vomiting and diarrhea: Likely viral gastroenteritis, but need to rule out C. 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 s/p SBRT on 05/09/14. Patent has been followed up by Dr. Evelena Leyden. Recently received Nivolumab immunotherapy.  -Will follow up with dr. Krista Blue  Focal seizure: Stable. -Continue Keppra  DVT ppx:    SQ Lovenox  Code Status: DNR Family Communication: None at bed side.    Disposition Plan: Admit to inpatient   Date of Service 08/01/2014    Ivor Costa [facility] Pager [phone]  If 7PM-7AM, please contact night-coverage [email or web address] Password [email or web address] 08/01/2014, 11:01 PM

## 2014-08-01 NOTE — Progress Notes (Signed)
[place] Work  Clinical Social Work was referred by patient for assessment of psychosocial needs due to care concerns at home, transportation and financial issues.  Clinical Social Worker met with patient at [facility] prior to [facility] to offer support and assess for needs. CSW amd pt completed SCAT application. She is open to SCAT services if we can get her SCAT passes. CSW inquired if pt had medicaid or thought she might be eligible. Pt stated she "had card at home, but never uses it". CSW educated pt on how if she used mcd for additional insurance she could get more assistance for home care, meds, no copays, etc. Pt open to [place] calling her this afternoon to get medicaid number and encouraged pt to bring card in at next appointment. If indeed she has medicaid, this would open many doors for her. Pt open to additional support at home as well. She is now requesting wheelchair at home. CSW let RN know and pt may need PT eval as well.  Pt felt her friend would be able to come back and pick her up after appointment today. CSW to follow and continue to assist.   Clinical Social Work interventions: Supportive listening Resource education SCAT application completed  Loren Racer, [place] Worker [place]  [place] Phone: [phone] Fax: [phone]

## 2014-08-01 NOTE — Progress Notes (Signed)
Patient reports that she has not increased her Keppra. She reports Dr. Mina Marble instructed her to continue Keppra 1000 mg bid. Patient denies any seizure activity since 3/2. Reports taking decadron taper as directed; she will begin 2 mg once per day for five days today. Biggest complaint today is nausea. Denies vomiting. Reports zofran doesn't help to relieve nausea. Reports using her Lovenox once per day instead of twice because she can't afford the refill. Reports this morning she only took her bp medication and keppra because she hasn't been able to eat due to nausea. Reports she is very weak. Riding in wheelchair today. Denies pain. Denies headache or dizziness. Denies diplopia or ringing in the ears. Heart rate elevated.

## 2014-08-02 ENCOUNTER — Encounter (HOSPITAL_COMMUNITY): Payer: Self-pay | Admitting: *Deleted

## 2014-08-02 ENCOUNTER — Encounter: Payer: Medicare Other | Admitting: Nutrition

## 2014-08-02 ENCOUNTER — Ambulatory Visit: Payer: Medicare Other

## 2014-08-02 ENCOUNTER — Ambulatory Visit: Payer: Medicare Other | Admitting: Hematology

## 2014-08-02 ENCOUNTER — Other Ambulatory Visit: Payer: Medicare Other

## 2014-08-02 DIAGNOSIS — E876 Hypokalemia: Secondary | ICD-10-CM

## 2014-08-02 DIAGNOSIS — C7931 Secondary malignant neoplasm of brain: Secondary | ICD-10-CM | POA: Diagnosis not present

## 2014-08-02 LAB — COMPREHENSIVE METABOLIC PANEL
ALT: 16 U/L (ref 0–35)
ANION GAP: 11 (ref 5–15)
AST: 28 U/L (ref 0–37)
Albumin: 2.1 g/dL — ABNORMAL LOW (ref 3.5–5.2)
Alkaline Phosphatase: 49 U/L (ref 39–117)
BILIRUBIN TOTAL: 1 mg/dL (ref 0.3–1.2)
BUN: 14 mg/dL (ref 6–23)
CO2: 21 mmol/L (ref 19–32)
CREATININE: 1.03 mg/dL (ref 0.50–1.10)
Calcium: 7.3 mg/dL — ABNORMAL LOW (ref 8.4–10.5)
Chloride: 109 mmol/L (ref 96–112)
GFR calc non Af Amer: 53 mL/min — ABNORMAL LOW (ref >90)
GFR, EST AFRICAN AMERICAN: 61 mL/min — AB (ref >90)
GLUCOSE: 86 mg/dL (ref 70–99)
Potassium: 3 mmol/L — ABNORMAL LOW (ref 3.5–5.1)
Sodium: 141 mmol/L (ref 135–145)
Total Protein: 5.7 g/dL — ABNORMAL LOW (ref 6.0–8.3)

## 2014-08-02 LAB — URINALYSIS, ROUTINE W REFLEX MICROSCOPIC
Glucose, UA: NEGATIVE mg/dL
Hgb urine dipstick: NEGATIVE
KETONES UR: NEGATIVE mg/dL
NITRITE: NEGATIVE
Protein, ur: NEGATIVE mg/dL
Specific Gravity, Urine: >1.046 — ABNORMAL HIGH (ref 1.005–1.030)
Urobilinogen, UA: 1 mg/dL (ref 0.0–1.0)
pH: 5 (ref 5.0–8.0)

## 2014-08-02 LAB — URINE MICROSCOPIC-ADD ON

## 2014-08-02 LAB — PROCALCITONIN: PROCALCITONIN: 30.56 ng/mL

## 2014-08-02 LAB — STREP PNEUMONIAE URINARY ANTIGEN: Strep Pneumo Urinary Antigen: NEGATIVE

## 2014-08-02 LAB — CBC
HEMATOCRIT: 29.2 % — AB (ref 36.0–46.0)
Hemoglobin: 9.9 g/dL — ABNORMAL LOW (ref 12.0–15.0)
MCH: 25.5 pg — ABNORMAL LOW (ref 26.0–34.0)
MCHC: 33.9 g/dL (ref 30.0–36.0)
MCV: 75.3 fL — ABNORMAL LOW (ref 78.0–100.0)
Platelets: 275 10*3/uL (ref 150–400)
RBC: 3.88 MIL/uL (ref 3.87–5.11)
RDW: 17.3 % — AB (ref 11.5–15.5)
WBC: 21.3 10*3/uL — AB (ref 4.0–10.5)

## 2014-08-02 LAB — INFLUENZA PANEL BY PCR (TYPE A & B)
H1N1 flu by pcr: NOT DETECTED
INFLAPCR: NEGATIVE
Influenza B By PCR: NEGATIVE

## 2014-08-02 LAB — CG4 I-STAT (LACTIC ACID): LACTIC ACID, VENOUS: 5.7 mmol/L — AB (ref 0.5–2.0)

## 2014-08-02 LAB — APTT: aPTT: 28 seconds (ref 24–37)

## 2014-08-02 LAB — GLUCOSE, CAPILLARY: Glucose-Capillary: 112 mg/dL — ABNORMAL HIGH (ref 70–99)

## 2014-08-02 LAB — PROTIME-INR
INR: 1.32 (ref 0.00–1.49)
PROTHROMBIN TIME: 16.5 s — AB (ref 11.6–15.2)

## 2014-08-02 MED ORDER — POTASSIUM CHLORIDE 10 MEQ/100ML IV SOLN
10.0000 meq | INTRAVENOUS | Status: AC
  Administered 2014-08-02 (×3): 10 meq via INTRAVENOUS
  Filled 2014-08-02 (×3): qty 100

## 2014-08-02 MED ORDER — DEXTROSE 5 % IV SOLN
1.0000 g | Freq: Two times a day (BID) | INTRAVENOUS | Status: DC
  Administered 2014-08-02 – 2014-08-05 (×8): 1 g via INTRAVENOUS
  Filled 2014-08-02 (×9): qty 1

## 2014-08-02 MED ORDER — ENOXAPARIN SODIUM 80 MG/0.8ML ~~LOC~~ SOLN
1.0000 mg/kg | Freq: Two times a day (BID) | SUBCUTANEOUS | Status: DC
  Administered 2014-08-02 – 2014-08-07 (×11): 80 mg via SUBCUTANEOUS
  Filled 2014-08-02 (×11): qty 0.8

## 2014-08-02 MED ORDER — ENOXAPARIN SODIUM 150 MG/ML ~~LOC~~ SOLN: 1.5000 mg/kg | SUBCUTANEOUS | Status: DC

## 2014-08-02 NOTE — Progress Notes (Signed)
Spoke with Linward Foster family friend regarding patients status.

## 2014-08-02 NOTE — Progress Notes (Signed)
PT Cancellation Note  Patient Details Name: Michelle Cobb MRN: [id number] DOB: April 30, 1941   Cancelled Treatment:    Reason Eval/Treat Not Completed: Patient's level of consciousness (per OT, pt not arousable/alert.  will check back as schedule permits.)   Orvin Netter,KATHrine E 08/02/2014, 10:45 AM Carmelia Bake, PT, DPT 08/02/2014 Pager: [phone]

## 2014-08-02 NOTE — Care Management Note (Addendum)
    Page 1 of 1   08/06/2014     11:12:20 AM CARE MANAGEMENT NOTE 08/06/2014  Patient:  Michelle Cobb, Michelle Cobb   Account Number:  [id number]  Date Initiated:  08/02/2014  Documentation initiated by:  Dessa Phi  Subjective/Objective Assessment:   74 y/o f admitted w/Sepsis.     Action/Plan:   From home.   Anticipated DC Date:  08/06/2014   Anticipated DC Plan:  [place]  CM consult      Choice offered to / List presented to:             Status of service:  Completed, signed off Medicare Important Message given?  YES (If response is "NO", the following Medicare IM given date fields will be blank) Date Medicare IM given:  08/05/2014 Medicare IM given by:  [facility] Date Additional Medicare IM given:   Additional Medicare IM given by:    Discharge Disposition:  [place]  Per UR Regulation:  Reviewed for med. necessity/level of care/duration of stay  If discussed at [place] of Stay Meetings, dates discussed:   08/06/2014    Comments:  08/06/14 Dessa Phi RN BSN NCM [phone] d/c SNF.  08/05/14 Dessa Phi RN BSN NCM [phone] PT-SNF.iv abx.  08/02/14 Dessa Phi RN BSN NCM [phone] PT-cons.Await recommendations.

## 2014-08-02 NOTE — Progress Notes (Signed)
Patient ID: Michelle Cobb  female  MMN:[id number]    DOB: Oct 19, 1940    DOA: 08/01/2014  PCP: Woody Seller, MD    Brief history of present illness Michelle Cobb is a 74 y.o. 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 Patient reports she started having worsening cough, runny nose, but no sore throat today. She felt whole-body aching and feels very weak. She coughed up some white colored mucus. No significant chest pain. She also had nausea, vomiting and diarrhea. She reported that she has diarrhea with loose stool and vomiting all day before admission. She did not have significant abdominal pain. Patient had fever and chills. Patient denied dysuria, urgency, frequency, hematuria, skin rashes or leg swelling. No unilateral weakness, numbness or tingling sensations. No vision change or hearing loss. In ED, patient was found to have leukocytosis with WBC 21.8, fever with temperature 101.2, soft blood pressure, tachycardia. X-ray showed stable moderate left pleural effusion and adjacent basilar consolidation/atelectasis. Patient was admitted to inpatient for further evaluation and treatment.    Assessment/Plan: Principal Problem:   Sepsis likely due to pneumonia, gastroenteritis, possible influenza -  Pro-calcitonin high 30.56, leukocytosis 21.3, continue vancomycin, cefepime  - Empirically started on Tamiflu, influenza panel negative, will discontinue Tamiflu - Leukocytosis also likely due to Decadron, continue IV hydrocortisone until BP stable, continue IV fluid hydration  Active Problems: Nausea, vomiting, diarrhea - Continue IV fluid hydration, start clear liquid diet,  follow C. 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 s/p SBRT on 05/09/14. Patent has been followed up by Dr. Evelena Leyden. Recently received Nivolumab immunotherapy.     Brain metastasis - Continue Decadron, on stress dose steroids  DVT - Continue home dose of Lovenox  History of seizures Continue Keppra   DVT Prophylaxis:Lovenox  Code Status:DO NOT RESUSCITATE  Family Communication:  no family member at the bedside, Discussed in detail with the patient, all imaging results, lab results explained to the patient. Will need PT evaluation prior to discharge, states that she lives alone    Disposition:  Consultants: None  Procedures: None  Antibiotics:  IV vancomycin  IV cefepime  Subjective: Patient seen and examined, somewhat lethargic but arousable and responds to questions. States that she lives at home alone. No fevers or chills. No nausea or vomiting  Objective: Weight change:   Intake/Output Summary (Last 24 hours) at 08/02/14 1404 Last data filed at 08/02/14 1351  Gross per 24 hour  Intake   1650 ml  Output    520 ml  Net   1130 ml   Blood pressure 91/51, pulse 95, temperature 98.1 F (36.7 C), temperature source Oral, resp. rate 24, height 5' 7.5" (1.715 m), weight 78.5 kg (173 lb 1 oz), SpO2 100 %.  Physical Exam: General:Somewhat lethargic but arousable, oriented to self and place  CVS: S1-S2 clear, no murmur rubs or gallops Chest:Decreased breath sounds at the bases  Abdomen: soft nontender, nondistended, normal bowel sounds  Extremities: no cyanosis, clubbing or edema noted bilaterally   Lab Results: Basic Metabolic Panel:  Recent Labs Lab 08/01/14 1854 08/02/14 0135  NA 137 141  K 3.4* 3.0*  CL 97  109  CO2 27 21  GLUCOSE 94 86  BUN 18 14  CREATININE 1.13* 1.03  CALCIUM 9.1 7.3*   Liver Function Tests:  Recent Labs Lab  08/01/14 1854 08/02/14 0135  AST 37 28  ALT 19 16  ALKPHOS 61 49  BILITOT 1.5* 1.0  PROT 7.9 5.7*  ALBUMIN 2.9* 2.1*   No results for input(s): LIPASE, AMYLASE in the last 168 hours. No results for input(s): AMMONIA in the last 168 hours. CBC:  Recent Labs Lab 08/01/14 1854 08/02/14 0135  WBC 21.8* 21.3*  HGB 11.9* 9.9*  HCT 35.3* 29.2*  MCV 75.3* 75.3*  PLT 324 275   Cardiac Enzymes: No results for input(s): CKTOTAL, CKMB, CKMBINDEX, TROPONINI in the last 168 hours. BNP: Invalid input(s): POCBNP CBG:  Recent Labs Lab 08/02/14 0739  GLUCAP 112*     Micro Results: No results found for this or any previous visit (from the past 240 hour(s)).  Studies/Results: Dg Chest 1 View  07/18/2014   CLINICAL DATA:  Pain following thoracentesis.  Left-sided effusion.  EXAM: CHEST  1 VIEW  COMPARISON:  CT chest with contrast 07/11/2014.  FINDINGS: The heart size is normal. There is a significant decrease in the left pleural effusion. There is no pneumothorax. The right lung is clear. The visualized soft tissues and bony thorax are otherwise unremarkable.  IMPRESSION: 1. Decreased left pleural effusion following thoracentesis. 2. No significant pneumothorax or other complication evident by radiograph. 3. The right lung is clear.   Electronically Signed   By: San Morelle M.D.   On: 07/18/2014 12:07   Dg Chest 2 View  08/01/2014   CLINICAL DATA:  Metastatic lung ca - cough - SOB - Pt resides at home. Pt c/o of SOB N/V. Symptoms started after returning home from here for MRI today  EXAM: CHEST - 2 VIEW  COMPARISON:  07/23/2014  FINDINGS: Moderate left pleural effusion. Consolidation/ atelectasis at the left lung base as before. Right lung clear. Heart size difficult to assess due to adjacent opacity. No pneumothorax Visualized skeletal structures are unremarkable.  IMPRESSION: 1. Stable moderate left pleural effusion and adjacent basilar consolidation/atelectasis.   Electronically  Signed   By: Lucrezia Europe M.D.   On: 08/01/2014 20:10   Dg Chest 2 View  07/23/2014   CLINICAL DATA:  Focal left arm seizures; known history of brain cancer and lung cancer. Initial encounter.  EXAM: CHEST  2 VIEW  COMPARISON:  Chest radiograph performed 07/18/2014  FINDINGS: There is mildly increased moderate left pleural effusion with associated airspace opacity. The right lung appears clear. There is mild rightward mediastinal shift, likely reflecting the pleural effusion. No pneumothorax is seen. The underlying left lower lobe lung mass is not well characterized on radiograph.  The cardiomediastinal silhouette remains borderline normal in size. No acute osseous abnormalities are identified.  IMPRESSION: Mildly increased moderate left pleural effusion, with associated airspace opacity. Underlying known lung mass not well characterized on radiograph.   Electronically Signed   By: Garald Balding M.D.   On: 07/23/2014 18:17   Ct Head Wo Contrast  07/23/2014   CLINICAL DATA:  Focal seizure  EXAM: CT HEAD WITHOUT CONTRAST  TECHNIQUE: Contiguous axial images were obtained from the base of the skull through the vertex without intravenous contrast.  COMPARISON:  04/18/2014  FINDINGS: Skull and Sinuses:Negative for fracture or destructive process. The mastoids, middle ears, and imaged paranasal sinuses are clear.  Orbits: Dysconjugate gaze.  Brain: Known cerebral metastases in the bilateral  posterior cerebral hemispheres, parietal on the right and frontal parietal on the left. The masses are cortically based on the right and subcortical on the left. Edema surrounding the left-sided metastasis has increased, but there is no midline shift or herniation, and the metastasis has increased in diameter to 18 mm (13 mm by contrast-enhanced MRI 05/01/2014). The edema tracks into the external and internal capsules posteriorly, making the posterior putamen prominent. Low-attenuation in the bilateral anterior internal capsules is  chronic, likely from multiple dilated perivascular spaces and white matter gliosis based on previous brain MRI. There is no evidence of acute hemorrhage. No acute infarct or hydrocephalus.  IMPRESSION: Known brain metastases. Compared to 05/01/2014 brain MRI, a left posterior frontal metastasis has increased size and vasogenic edema.   Electronically Signed   By: Monte Fantasia M.D.   On: 07/23/2014 21:09   Ct Chest W Contrast  07/11/2014   CLINICAL DATA:  Stage IV left lung cancer, after chemotherapy  EXAM: CT CHEST, ABDOMEN, AND PELVIS WITH CONTRAST  TECHNIQUE: Multidetector CT imaging of the chest, abdomen and pelvis was performed following the standard protocol during bolus administration of intravenous contrast.  CONTRAST:  156mL OMNIPAQUE IOHEXOL 300 MG/ML  SOLN  COMPARISON:  None.  FINDINGS: CT CHEST FINDINGS  Mediastinum/Nodes: Heart is normal in size. No pericardial effusion.  Coronary atherosclerosis in the LAD.  Atherosclerotic calcifications of the aortic arch. Mild ectasia of the ascending thoracic aortic, measuring 3.4 cm.  Small mediastinal nodes, including a 5 mm AP window node in a 6 mm short axis prevascular node. 2.2 x 2.5 cm left hilar node (series 2/image 35), previously 1.8 x 2.6 cm.  Visualized thyroid is unremarkable.  Lungs/Pleura: 5.6 x 4.4 cm area of mass-like consolidation in the left lower lobe (series 2/image 46), corresponding to a prior 8.0 x 4.7 cm mass.  Moderate to large left pleural effusion, partially loculated, with associated pleural-based nodularity (series 2/image 50).  Visualized right lung is clear.  Moderate centrilobular and paraseptal emphysematous changes.  No pneumothorax.  Musculoskeletal: Mild degenerative changes of the thoracic spine.  CT ABDOMEN PELVIS FINDINGS  Hepatobiliary: Possible 8 x 11 mm hypoenhancing lesion in the central right liver anterior to the intrahepatic IVC (series 2/image 54).  2.2 x 1.2 cm hypoenhancing metastasis posteriorly in the lateral  segment left hepatic lobe (series 2/image 60), previously 7 mm.  Gallbladder is notable for multiple layering gallstones, without associated inflammatory changes. No intrahepatic or extrahepatic ductal dilatation.  Pancreas: Within normal limits.  Spleen: Within normal limits.  Adrenals/Urinary Tract: Adrenal glands unremarkable.  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  Right kidney is within normal limits.  No hydronephrosis.  Bladder is within normal limits.  Stomach/Bowel: Stomach is unremarkable.  No evidence of bowel obstruction.  Normal appendix.  Vascular/Lymphatic: Atherosclerotic calcifications of the abdominal aorta and branch vessels.  Fusiform ectasia of the infrarenal abdominal aorta, measuring 2.6 x 2.7 cm (series 2/image 37).  No suspicious abdominopelvic lymphadenopathy.  Reproductive: Status post hysterectomy.  No adnexal masses.  Other: No abdominopelvic ascites.  Musculoskeletal: 8 mm sclerotic lesion in the superior endplate of L4, new, suspicious (sagittal image 71). Additional sclerotic lesion in the sacrum (sagittal image 70), grossly unchanged.  IMPRESSION: Mixed response.  Left lower lobe mass has decreased in size.  Associated mediastinal/left hilar lymphadenopathy is mildly decreased. Moderate to large left pleural effusion, likely malignant, new/increased.  Suspected mild progression of hepatic metastases.  New 8 mm sclerotic lesion  at L4, suspicious for osseous metastasis.  Additional ancillary findings as above.   Electronically Signed   By: Julian Hy M.D.   On: 07/11/2014 11:10   Mr Jeri Cos EK Contrast  07/29/2014   CLINICAL DATA:  Metastatic lung cancer. Three month stereotactic radiosurgery follow-up  EXAM: MRI HEAD WITHOUT AND WITH CONTRAST  TECHNIQUE: Multiplanar, multiecho pulse sequences of the brain and surrounding structures were obtained without and with intravenous contrast.  CONTRAST:  16 mL MultiHance IV  COMPARISON:  MRI  05/01/2014  FINDINGS: Multiple metastatic deposits. Increase in edema in the left parietal lobe secondary to enlarging hemorrhagic metastatic deposit. Right parietal edema also has progressed somewhat. Ventricle size remains normal. No significant shift of the midline structures. No acute infarct.  Punctate calcification right cerebellum unchanged without enhancement.  3 mm enhancing nodule right superior cerebellum unchanged consistent with metastatic deposit.  2 mm enhancing nodule central pons unchanged.  19 x 20 mm hemorrhagic lesion left parietal lobe has increased in size since the prior study. Increased surrounding white matter edema.  11 x 13 mm hemorrhagic enhancing lesion high right parietal lobe is similar in size. Slight increase in surrounding edema.  New enhancing lesion left frontal lobe over the convexity measuring 4 x 6.6 mm. This is consistent with metastatic disease and is new.  Calvarium is negative.  Paranasal sinuses clear  IMPRESSION: Multiple metastatic deposits are present as described above.  Hemorrhagic lesion left parietal lobe has increased in size with increase in surrounding edema.  New lesion over the left frontal convexity measuring 4 x 6.6 mm.  Right high parietal lesion is similar in size but slightly increased edema since the prior study. Other lesions are stable.   Electronically Signed   By: Franchot Gallo M.D.   On: 07/29/2014 15:06   Ct Abdomen Pelvis W Contrast  07/11/2014   CLINICAL DATA:  Stage IV left lung cancer, after chemotherapy  EXAM: CT CHEST, ABDOMEN, AND PELVIS WITH CONTRAST  TECHNIQUE: Multidetector CT imaging of the chest, abdomen and pelvis was performed following the standard protocol during bolus administration of intravenous contrast.  CONTRAST:  142m OMNIPAQUE IOHEXOL 300 MG/ML  SOLN  COMPARISON:  None.  FINDINGS: CT CHEST FINDINGS  Mediastinum/Nodes: Heart is normal in size. No pericardial effusion.  Coronary atherosclerosis in the LAD.  Atherosclerotic  calcifications of the aortic arch. Mild ectasia of the ascending thoracic aortic, measuring 3.4 cm.  Small mediastinal nodes, including a 5 mm AP window node in a 6 mm short axis prevascular node. 2.2 x 2.5 cm left hilar node (series 2/image 35), previously 1.8 x 2.6 cm.  Visualized thyroid is unremarkable.  Lungs/Pleura: 5.6 x 4.4 cm area of mass-like consolidation in the left lower lobe (series 2/image 46), corresponding to a prior 8.0 x 4.7 cm mass.  Moderate to large left pleural effusion, partially loculated, with associated pleural-based nodularity (series 2/image 50).  Visualized right lung is clear.  Moderate centrilobular and paraseptal emphysematous changes.  No pneumothorax.  Musculoskeletal: Mild degenerative changes of the thoracic spine.  CT ABDOMEN PELVIS FINDINGS  Hepatobiliary: Possible 8 x 11 mm hypoenhancing lesion in the central right liver anterior to the intrahepatic IVC (series 2/image 54).  2.2 x 1.2 cm hypoenhancing metastasis posteriorly in the lateral segment left hepatic lobe (series 2/image 60), previously 7 mm.  Gallbladder is notable for multiple layering gallstones, without associated inflammatory changes. No intrahepatic or extrahepatic ductal dilatation.  Pancreas: Within normal limits.  Spleen: Within normal limits.  Adrenals/Urinary Tract: Adrenal glands unremarkable.  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  Right kidney is within normal limits.  No hydronephrosis.  Bladder is within normal limits.  Stomach/Bowel: Stomach is unremarkable.  No evidence of bowel obstruction.  Normal appendix.  Vascular/Lymphatic: Atherosclerotic calcifications of the abdominal aorta and branch vessels.  Fusiform ectasia of the infrarenal abdominal aorta, measuring 2.6 x 2.7 cm (series 2/image 37).  No suspicious abdominopelvic lymphadenopathy.  Reproductive: Status post hysterectomy.  No adnexal masses.  Other: No abdominopelvic ascites.  Musculoskeletal: 8 mm  sclerotic lesion in the superior endplate of L4, new, suspicious (sagittal image 71). Additional sclerotic lesion in the sacrum (sagittal image 70), grossly unchanged.  IMPRESSION: Mixed response.  Left lower lobe mass has decreased in size.  Associated mediastinal/left hilar lymphadenopathy is mildly decreased. Moderate to large left pleural effusion, likely malignant, new/increased.  Suspected mild progression of hepatic metastases.  New 8 mm sclerotic lesion at L4, suspicious for osseous metastasis.  Additional ancillary findings as above.   Electronically Signed   By: Julian Hy M.D.   On: 07/11/2014 11:10   US Thoracentesis Asp Pleural Space W/img Guide  07/18/2014   CLINICAL DATA:  Shortness of breath. Left-sided pleural effusion. Request diagnostic and therapeutic thoracentesis.  EXAM: ULTRASOUND GUIDED LEFT THORACENTESIS  COMPARISON:  None.  PROCEDURE: An ultrasound guided thoracentesis was thoroughly discussed with the patient and questions answered. The benefits, risks, alternatives and complications were also discussed. The patient understands and wishes to proceed with the procedure. Written consent was obtained.  Ultrasound was performed to localize and mark an adequate pocket of fluid in the left chest. The area was then prepped and draped in the normal sterile fashion. 1% Lidocaine was used for local anesthesia. Under ultrasound guidance a 19 gauge Yueh catheter was introduced. Thoracentesis was performed. The catheter was removed and a dressing applied.  COMPLICATIONS: None  FINDINGS: A total of approximately 750 mL of clear, dark amber colored fluid was removed. A fluid sample wassent for laboratory analysis.  IMPRESSION: Successful ultrasound guided left thoracentesis yielding 750 mL of pleural fluid.  Read by: Ascencion Dike PA-C   Electronically Signed   By: Corrie Mckusick D.O.   On: 07/18/2014 11:27    Medications: Scheduled Meds: . atorvastatin  10 mg Oral QHS  . ceFEPime  (MAXIPIME) IV  1 g Intravenous Q12H  . cholecalciferol  1,000 Units Oral Daily  . dexamethasone  4 mg Oral TID  . dronabinol  2.5 mg Oral BID AC  . enoxaparin (LOVENOX) injection  1 mg/kg Subcutaneous Q12H  . folic acid  1 mg Oral Daily  . hydrocortisone sod succinate (SOLU-CORTEF) inj  50 mg Intravenous Q12H  . levETIRAcetam  1,000 mg Oral BID  . mirtazapine  15 mg Oral QHS  . oseltamivir  75 mg Oral BID  . potassium chloride  40 mEq Oral Once  . sodium chloride  3 mL Intravenous Q12H  . vancomycin  750 mg Intravenous Q12H   Time spent 25 minutes   LOS: 1 day   RAI,RIPUDEEP M.D. [facility] 08/02/2014, 2:04 PM Pager: [phone]  If 7PM-7AM, please contact night-coverage [email or web address] Password [email or web address]

## 2014-08-02 NOTE — Progress Notes (Signed)
OT Cancellation Note  Patient Details Name: [name] MRN: [id number] DOB: Dec 04, 1940   Cancelled Treatment:    Reason Eval/Treat Not Completed: Patient's level of consciousness;Patient not medically ready;OT screened & acute OT assessment attempted. Pt unable to open her eyes and or follow simple commands. Pt moaning at times & unable to answer all questions. Pt currently unable to participate in skilled OT sessions due to above. Please re-order as pt becomes more alert and medically able to participate. Will sign off acute OT at this time.  Carlynn Herald, Amy Beth Dixon, OTR/L 08/02/2014, 9:11 AM

## 2014-08-02 NOTE — Progress Notes (Signed)
Rx Brief Antibiotic consult note:  Cefepime  Assessment: 5 yoF with h/o NSLC with brain mets presents with shortness of breath, nausea/vomiting. Patient had outpatient MRI today. Code sepsis called. Pharmacy contacted EDP 30 minutes after code was alerted to obtain antibiotic orders. Pharmacy consulted to dose Vancomycin and Zosyn. Vancomycin 1g and Zosyn 3.375g x 1 ordered by EDP. RN contacted to administer fluid bolus and antibiotics within the hour. Patient had been transferred to xray at that time and RN communicated that she would administer antibiotics as soon as patient returns. MD changed Zosyn to Cefepime.   Tmax: 101.2 WBC: 21.8 Renal: SCr 1.13, CrCl~55 ml/min (CG), ~50 ml/min (normalized)  Goal of Therapy:  Doses adjusted per renal function Eradication of infection  Plan:  Cefepime 1Gm IV q12h  F/u SCr/cultures/clinical course  Dorrene German 08/02/2014 1:51 AM

## 2014-08-02 NOTE — Progress Notes (Signed)
Patient alert at this time, able to answer some questions, patient does not remember what day she had her last Lovenox injection.

## 2014-08-02 NOTE — Progress Notes (Signed)
INITIAL NUTRITION ASSESSMENT  DOCUMENTATION CODES Per approved criteria  -Severe malnutrition in the context of chronic illness  Pt meets criteria for severe MALNUTRITION in the context of chronic illness as evidenced by 17% weight loss x 3 months and energy intake <75% for >/= 1 month.  INTERVENTION: Encourage PO intake Diet advancement per MD past clear liquid Placed pt on meal order with assist  RD to continue to monitor for supplement and education needs  NUTRITION DIAGNOSIS: Unintentional weight loss related to lung cancer with brain mets as evidenced by 37 lb weight loss x 3 months.   Goal: Pt to meet >/= 90% of their estimated nutrition needs   Monitor:  PO and supplemental intake, weight, labs, I/O's  Reason for Assessment: Pt identified as at nutrition risk on the Malnutrition Screen Tool  Admitting Dx: Sepsis  ASSESSMENT: 74 y.o. 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  Pt reports not eating anything today because she had not ordered anything. Pt is on clear liquid diet. RD to place pt on meal order with assist to assure that she is ordering her meals.  Pt seemed confused when answering questions. Pt reports N/V all days yesterday. Prior to that she states she was not eating well. Last thing she remembers eating is chicken noodle soup and she could not recall when that was.  Pt with 37 lb weight loss since 05/01/14 (17% weight loss x 3 months, significant for time frame).  When asked if pt is interested in receiving nutritional supplements, pt declined d/t believing that Ensure and drinks like them cause her seizures.  Nutrition Focused Physical  Exam:  Subcutaneous Fat:  Orbital Region: mild depletion Upper Arm Region: WNL Thoracic and Lumbar Region: NA  Muscle:  Temple Region: moderate depletion Clavicle Bone Region: moderate depletion Clavicle and Acromion Bone Region: moderate depletion Scapular Bone Region: NA Dorsal Hand: WNL Patellar Region: edematous Anterior Thigh Region: edematous Posterior Calf Region: edematous  Edema: +1 RLE & +1 LLE edema  Labs reviewed: Low K  Height: Ht Readings from Last 1 Encounters:  08/01/14 5' 7.5" (1.715 m)    Weight: Wt Readings from Last 1 Encounters:  08/01/14 173 lb 1 oz (78.5 kg)    Ideal Body Weight: 140 lb  % Ideal Body Weight: 124%  Wt Readings from Last 10 Encounters:  08/01/14 173 lb 1 oz (78.5 kg)  07/29/14 177 lb (80.287 kg)  07/22/14 177 lb (80.287 kg)  07/15/14 180 lb (81.647 kg)  06/26/14 195 lb (88.451 kg)  06/24/14 195 lb 14.4 oz (88.86 kg)  06/19/14 193 lb 11.2 oz (87.862 kg)  06/10/14 195 lb 9.6 oz (88.724 kg)  05/23/14 197 lb 4.8 oz (89.495 kg)  05/01/14 215 lb (97.523 kg)    Usual Body Weight: 215 lb  % Usual Body Weight: 80%  BMI:  Body mass index is 26.69 kg/(m^2).  Estimated Nutritional Needs: Kcal: 1950-2150 Protein: 105-115g Fluid: 2L/day  Skin: intact  Diet Order: Diet clear liquid  EDUCATION NEEDS: -No education needs identified at this time   Intake/Output Summary (Last 24 hours) at 08/02/14 0932 Last data filed at 08/01/14 2324  Gross per 24 hour  Intake   1500 ml  Output    300 ml  Net  1200 ml    Last BM: PTA  Labs:   Recent Labs Lab 08/01/14 1854 08/02/14 0135  NA 137 141  K 3.4* 3.0*  CL 97 109  CO2 27 21  BUN 18 14  CREATININE 1.13* 1.03  CALCIUM 9.1 7.3*  GLUCOSE 94 86    CBG (last 3)   Recent Labs  08/02/14 0739  GLUCAP 112*    Scheduled Meds: . atorvastatin  10 mg Oral QHS  . ceFEPime (MAXIPIME) IV  1 g Intravenous Q12H  . cholecalciferol  1,000 Units Oral Daily  .  dexamethasone  4 mg Oral TID  . dronabinol  2.5 mg Oral BID AC  . enoxaparin (LOVENOX) injection  1 mg/kg Subcutaneous Q12H  . folic acid  1 mg Oral Daily  . hydrocortisone sod succinate (SOLU-CORTEF) inj  50 mg Intravenous Q12H  . levETIRAcetam  1,000 mg Oral BID  . mirtazapine  15 mg Oral QHS  . oseltamivir  75 mg Oral BID  . potassium chloride  10 mEq Intravenous Q1 Hr x 3  . potassium chloride  40 mEq Oral Once  . sodium chloride  3 mL Intravenous Q12H  . vancomycin  750 mg Intravenous Q12H    Continuous Infusions: . sodium chloride 150 mL/hr at 08/02/14 9794    Past Medical History  Diagnosis Date  . Hypertension   . Hyperlipidemia   . Knee injury 1983    left  . Lung cancer dx'd 03/2014  . Brain cancer   . Seizures     Past Surgical History  Procedure Laterality Date  . Video bronchoscopy Bilateral 04/19/2014    Procedure: VIDEO BRONCHOSCOPY WITHOUT FLUORO;  Surgeon: Rigoberto Noel, MD;  Location: [facility];  Service: Cardiopulmonary;  Laterality: Bilateral;  . Abdominal hysterectomy  1983    Clayton Bibles, MS, RD, LDN Pager: [phone] After Hours Pager: [phone]

## 2014-08-03 LAB — URINALYSIS, ROUTINE W REFLEX MICROSCOPIC
BILIRUBIN URINE: NEGATIVE
GLUCOSE, UA: NEGATIVE mg/dL
Hgb urine dipstick: NEGATIVE
KETONES UR: NEGATIVE mg/dL
NITRITE: NEGATIVE
Protein, ur: 30 mg/dL — AB
Specific Gravity, Urine: 1.026 (ref 1.005–1.030)
UROBILINOGEN UA: 0.2 mg/dL (ref 0.0–1.0)
pH: 6 (ref 5.0–8.0)

## 2014-08-03 LAB — BASIC METABOLIC PANEL
Anion gap: 9 (ref 5–15)
BUN: 18 mg/dL (ref 6–23)
CALCIUM: 7.3 mg/dL — AB (ref 8.4–10.5)
CO2: 20 mmol/L (ref 19–32)
Chloride: 111 mmol/L (ref 96–112)
Creatinine, Ser: 0.73 mg/dL (ref 0.50–1.10)
GFR calc Af Amer: >90 mL/min (ref >90)
GFR calc non Af Amer: 83 mL/min — ABNORMAL LOW (ref >90)
Glucose, Bld: 106 mg/dL — ABNORMAL HIGH (ref 70–99)
Potassium: 3.9 mmol/L (ref 3.5–5.1)
SODIUM: 140 mmol/L (ref 135–145)

## 2014-08-03 LAB — CBC
HCT: 28.1 % — ABNORMAL LOW (ref 36.0–46.0)
HEMOGLOBIN: 9.5 g/dL — AB (ref 12.0–15.0)
MCH: 25.5 pg — AB (ref 26.0–34.0)
MCHC: 33.8 g/dL (ref 30.0–36.0)
MCV: 75.3 fL — AB (ref 78.0–100.0)
Platelets: 173 10*3/uL (ref 150–400)
RBC: 3.73 MIL/uL — AB (ref 3.87–5.11)
RDW: 18.3 % — AB (ref 11.5–15.5)
WBC: 18.5 10*3/uL — AB (ref 4.0–10.5)

## 2014-08-03 LAB — URINE MICROSCOPIC-ADD ON

## 2014-08-03 LAB — GLUCOSE, CAPILLARY: GLUCOSE-CAPILLARY: 106 mg/dL — AB (ref 70–99)

## 2014-08-03 NOTE — Progress Notes (Signed)
Patient ID: Michelle Cobb  female  DJM:[id number]    DOB: June 27, 1940    DOA: 08/01/2014  PCP: Woody Seller, MD    Brief history of present illness Michelle Cobb is a 73 y.o. 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 Patient reports she started having worsening cough, runny nose, but no sore throat today. She felt whole-body aching and feels very weak. She coughed up some white colored mucus. No significant chest pain. She also had nausea, vomiting and diarrhea. She reported that she has diarrhea with loose stool and vomiting all day before admission. She did not have significant abdominal pain. Patient had fever and chills. Patient denied dysuria, urgency, frequency, hematuria, skin rashes or leg swelling. No unilateral weakness, numbness or tingling sensations. No vision change or hearing loss. In ED, patient was found to have leukocytosis with WBC 21.8, fever with temperature 101.2, soft blood pressure, tachycardia. X-ray showed stable moderate left pleural effusion and adjacent basilar consolidation/atelectasis. Patient was admitted to inpatient for further evaluation and treatment.    Assessment/Plan: Principal Problem:   Sepsis likely due to pneumonia, gastroenteritis: Improving, influenza ruled out -  Pro-calcitonin high 30.56, leukocytosis  improving 18.5 (patient is also on steroids ) continue vancomycin, cefepime  - Leukocytosis also likely due to Decadron,BP currently stopped, continue IV fluids and IV hydrocortisone, will taper tomorrow  - UA positive for UTI however urine culture not done at admission, reordered  Active Problems: Nausea, vomiting,  diarrhea - Continue IV fluid hydration,  advance to soft diet , no bowel movement in 2 days, DC'd C. 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 s/p SBRT on 05/09/14. Patent has been followed up by Dr. Evelena Leyden. Recently received Nivolumab immunotherapy.     Brain metastasis - Continue Decadron, on stress dose steroids  DVT - Continue home dose of Lovenox  History of seizures Continue Keppra   DVT Prophylaxis:Lovenox  Code Status: DO NOT RESUSCITATE  Family Communication:  no family member at the bedside, Discussed in detail with the patient, all imaging results, lab results explained to the patient.  PT evaluation recommending skilled nursing facility    Disposition:  Consultants: None   Procedures: None   Antibiotics:  IV vancomycin  IV cefepime  Subjective: Patient seen and examined, alert and oriented, states he hasn't slept well, "people keep interrupting me". No acute issues overnight, no chest pain, shortness of breath, fever or chills.  Objective: Weight change:   Intake/Output Summary (Last 24 hours) at 08/03/14 1225 Last data filed at 08/03/14 0600  Gross per 24 hour  Intake 3656.25 ml  Output    620 ml  Net 3036.25 ml   Blood pressure 90/60, pulse 85, temperature 97.5 F (36.4 C), temperature source Oral, resp. rate 20, height 5' 7.5" (1.715 m), weight 78.5 kg (173 lb 1 oz), SpO2 98 %.  Physical Exam: General: Alert and oriented 3, NAD  CVS: S1-S2 clear, no murmur rubs or gallops Chest:Decreased breath sounds at the bases  Abdomen: soft nontender, nondistended, normal bowel sounds  Extremities: no cyanosis, clubbing or edema noted bilaterally   Lab Results: Basic Metabolic Panel:  Recent Labs Lab 08/02/14 0135 08/03/14  0540  NA 141 140  K 3.0* 3.9  CL 109 111  CO2 21 20  GLUCOSE 86 106*  BUN 14 18  CREATININE 1.03 0.73  CALCIUM 7.3* 7.3*    Liver Function Tests:  Recent Labs Lab 08/01/14 1854 08/02/14 0135  AST 37 28  ALT 19 16  ALKPHOS 61 49  BILITOT 1.5* 1.0  PROT 7.9 5.7*  ALBUMIN 2.9* 2.1*   No results for input(s): LIPASE, AMYLASE in the last 168 hours. No results for input(s): AMMONIA in the last 168 hours. CBC:  Recent Labs Lab 08/02/14 0135 08/03/14 0540  WBC 21.3* 18.5*  HGB 9.9* 9.5*  HCT 29.2* 28.1*  MCV 75.3* 75.3*  PLT 275 173   Cardiac Enzymes: No results for input(s): CKTOTAL, CKMB, CKMBINDEX, TROPONINI in the last 168 hours. BNP: Invalid input(s): POCBNP CBG:  Recent Labs Lab 08/02/14 0739 08/03/14 0743  GLUCAP 112* 106*     Micro Results: Recent Results (from the past 240 hour(s))  Blood Culture (routine x 2)     Status: None (Preliminary result)   Collection Time: 08/01/14  6:54 PM  Result Value Ref Range Status   Specimen Description RIGHT ANTECUBITAL  Final   Special Requests BOTTLES DRAWN AEROBIC AND ANAEROBIC 5CC  Final   Culture   Final           BLOOD CULTURE RECEIVED NO GROWTH TO DATE CULTURE WILL BE HELD FOR 5 DAYS BEFORE ISSUING A FINAL NEGATIVE REPORT Performed at [facility]    Report Status PENDING  Incomplete  Blood Culture (routine x 2)     Status: None (Preliminary result)   Collection Time: 08/01/14  8:15 PM  Result Value Ref Range Status   Specimen Description BLOOD RIGHT HAND  Final   Special Requests BOTTLES DRAWN AEROBIC AND ANAEROBIC 4CC  Final   Culture   Final           BLOOD CULTURE RECEIVED NO GROWTH TO DATE CULTURE WILL BE HELD FOR 5 DAYS BEFORE ISSUING A FINAL NEGATIVE REPORT Performed at [facility]    Report Status PENDING  Incomplete    Studies/Results: Dg Chest 1 View  07/18/2014   CLINICAL DATA:  Pain following thoracentesis.  Left-sided effusion.  EXAM: CHEST  1 VIEW  COMPARISON:  CT chest with contrast 07/11/2014.  FINDINGS: The heart size is normal. There is a significant decrease in the left pleural effusion.  There is no pneumothorax. The right lung is clear. The visualized soft tissues and bony thorax are otherwise unremarkable.  IMPRESSION: 1. Decreased left pleural effusion following thoracentesis. 2. No significant pneumothorax or other complication evident by radiograph. 3. The right lung is clear.   Electronically Signed   By: San Morelle M.D.   On: 07/18/2014 12:07   Dg Chest 2 View  08/01/2014   CLINICAL DATA:  Metastatic lung ca - cough - SOB - Pt resides at home. Pt c/o of SOB N/V. Symptoms started after returning home from here for MRI today  EXAM: CHEST - 2 VIEW  COMPARISON:  07/23/2014  FINDINGS: Moderate left pleural effusion. Consolidation/ atelectasis at the left lung base as before. Right lung clear. Heart size difficult to assess due to adjacent opacity. No pneumothorax Visualized skeletal structures are unremarkable.  IMPRESSION: 1. Stable moderate left pleural effusion and adjacent basilar consolidation/atelectasis.   Electronically Signed   By: Lucrezia Europe M.D.   On: 08/01/2014 20:10   Dg Chest 2 View  07/23/2014  CLINICAL DATA:  Focal left arm seizures; known history of brain cancer and lung cancer. Initial encounter.  EXAM: CHEST  2 VIEW  COMPARISON:  Chest radiograph performed 07/18/2014  FINDINGS: There is mildly increased moderate left pleural effusion with associated airspace opacity. The right lung appears clear. There is mild rightward mediastinal shift, likely reflecting the pleural effusion. No pneumothorax is seen. The underlying left lower lobe lung mass is not well characterized on radiograph.  The cardiomediastinal silhouette remains borderline normal in size. No acute osseous abnormalities are identified.  IMPRESSION: Mildly increased moderate left pleural effusion, with associated airspace opacity. Underlying known lung mass not well characterized on radiograph.   Electronically Signed   By: Jeffery  Chang M.D.   On: 07/23/2014 18:17   Ct Head Wo Contrast  07/23/2014    CLINICAL DATA:  Focal seizure  EXAM: CT HEAD WITHOUT CONTRAST  TECHNIQUE: Contiguous axial images were obtained from the base of the skull through the vertex without intravenous contrast.  COMPARISON:  04/18/2014  FINDINGS: Skull and Sinuses:Negative for fracture or destructive process. The mastoids, middle ears, and imaged paranasal sinuses are clear.  Orbits: Dysconjugate gaze.  Brain: Known cerebral metastases in the bilateral posterior cerebral hemispheres, parietal on the right and frontal parietal on the left. The masses are cortically based on the right and subcortical on the left. Edema surrounding the left-sided metastasis has increased, but there is no midline shift or herniation, and the metastasis has increased in diameter to 18 mm (13 mm by contrast-enhanced MRI 05/01/2014). The edema tracks into the external and internal capsules posteriorly, making the posterior putamen prominent. Low-attenuation in the bilateral anterior internal capsules is chronic, likely from multiple dilated perivascular spaces and white matter gliosis based on previous brain MRI. There is no evidence of acute hemorrhage. No acute infarct or hydrocephalus.  IMPRESSION: Known brain metastases. Compared to 05/01/2014 brain MRI, a left posterior frontal metastasis has increased size and vasogenic edema.   Electronically Signed   By: Jonathon  Watts M.D.   On: 07/23/2014 21:09   Ct Chest W Contrast  07/11/2014   CLINICAL DATA:  Stage IV left lung cancer, after chemotherapy  EXAM: CT CHEST, ABDOMEN, AND PELVIS WITH CONTRAST  TECHNIQUE: Multidetector CT imaging of the chest, abdomen and pelvis was performed following the standard protocol during bolus administration of intravenous contrast.  CONTRAST:  100mL OMNIPAQUE IOHEXOL 300 MG/ML  SOLN  COMPARISON:  None.  FINDINGS: CT CHEST FINDINGS  Mediastinum/Nodes: Heart is normal in size. No pericardial effusion.  Coronary atherosclerosis in the LAD.  Atherosclerotic calcifications of  the aortic arch. Mild ectasia of the ascending thoracic aortic, measuring 3.4 cm.  Small mediastinal nodes, including a 5 mm AP window node in a 6 mm short axis prevascular node. 2.2 x 2.5 cm left hilar node (series 2/image 35), previously 1.8 x 2.6 cm.  Visualized thyroid is unremarkable.  Lungs/Pleura: 5.6 x 4.4 cm area of mass-like consolidation in the left lower lobe (series 2/image 46), corresponding to a prior 8.0 x 4.7 cm mass.  Moderate to large left pleural effusion, partially loculated, with associated pleural-based nodularity (series 2/image 50).  Visualized right lung is clear.  Moderate centrilobular and paraseptal emphysematous changes.  No pneumothorax.  Musculoskeletal: Mild degenerative changes of the thoracic spine.  CT ABDOMEN PELVIS FINDINGS  Hepatobiliary: Possible 8 x 11 mm hypoenhancing lesion in the central right liver anterior to the intrahepatic IVC (series 2/image 54).  2.2 x 1.2 cm hypoenhancing metastasis posteriorly in the   lateral segment left hepatic lobe (series 2/image 60), previously 7 mm.  Gallbladder is notable for multiple layering gallstones, without associated inflammatory changes. No intrahepatic or extrahepatic ductal dilatation.  Pancreas: Within normal limits.  Spleen: Within normal limits.  Adrenals/Urinary Tract: Adrenal glands unremarkable.  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  Right kidney is within normal limits.  No hydronephrosis.  Bladder is within normal limits.  Stomach/Bowel: Stomach is unremarkable.  No evidence of bowel obstruction.  Normal appendix.  Vascular/Lymphatic: Atherosclerotic calcifications of the abdominal aorta and branch vessels.  Fusiform ectasia of the infrarenal abdominal aorta, measuring 2.6 x 2.7 cm (series 2/image 37).  No suspicious abdominopelvic lymphadenopathy.  Reproductive: Status post hysterectomy.  No adnexal masses.  Other: No abdominopelvic ascites.  Musculoskeletal: 8 mm sclerotic lesion  in the superior endplate of L4, new, suspicious (sagittal image 71). Additional sclerotic lesion in the sacrum (sagittal image 70), grossly unchanged.  IMPRESSION: Mixed response.  Left lower lobe mass has decreased in size.  Associated mediastinal/left hilar lymphadenopathy is mildly decreased. Moderate to large left pleural effusion, likely malignant, new/increased.  Suspected mild progression of hepatic metastases.  New 8 mm sclerotic lesion at L4, suspicious for osseous metastasis.  Additional ancillary findings as above.   Electronically Signed   By: Sriyesh  Krishnan M.D.   On: 07/11/2014 11:10   Mr Brain W Wo Contrast  07/29/2014   CLINICAL DATA:  Metastatic lung cancer. Three month stereotactic radiosurgery follow-up  EXAM: MRI HEAD WITHOUT AND WITH CONTRAST  TECHNIQUE: Multiplanar, multiecho pulse sequences of the brain and surrounding structures were obtained without and with intravenous contrast.  CONTRAST:  16 mL MultiHance IV  COMPARISON:  MRI 05/01/2014  FINDINGS: Multiple metastatic deposits. Increase in edema in the left parietal lobe secondary to enlarging hemorrhagic metastatic deposit. Right parietal edema also has progressed somewhat. Ventricle size remains normal. No significant shift of the midline structures. No acute infarct.  Punctate calcification right cerebellum unchanged without enhancement.  3 mm enhancing nodule right superior cerebellum unchanged consistent with metastatic deposit.  2 mm enhancing nodule central pons unchanged.  19 x 20 mm hemorrhagic lesion left parietal lobe has increased in size since the prior study. Increased surrounding white matter edema.  11 x 13 mm hemorrhagic enhancing lesion high right parietal lobe is similar in size. Slight increase in surrounding edema.  New enhancing lesion left frontal lobe over the convexity measuring 4 x 6.6 mm. This is consistent with metastatic disease and is new.  Calvarium is negative.  Paranasal sinuses clear  IMPRESSION:  Multiple metastatic deposits are present as described above.  Hemorrhagic lesion left parietal lobe has increased in size with increase in surrounding edema.  New lesion over the left frontal convexity measuring 4 x 6.6 mm.  Right high parietal lesion is similar in size but slightly increased edema since the prior study. Other lesions are stable.   Electronically Signed   By: Charles  Clark M.D.   On: 07/29/2014 15:06   Ct Abdomen Pelvis W Contrast  07/11/2014   CLINICAL DATA:  Stage IV left lung cancer, after chemotherapy  EXAM: CT CHEST, ABDOMEN, AND PELVIS WITH CONTRAST  TECHNIQUE: Multidetector CT imaging of the chest, abdomen and pelvis was performed following the standard protocol during bolus administration of intravenous contrast.  CONTRAST:  100mL OMNIPAQUE IOHEXOL 300 MG/ML  SOLN  COMPARISON:  None.  FINDINGS: CT CHEST FINDINGS  Mediastinum/Nodes: Heart is normal in size. No pericardial effusion.  Coronary   atherosclerosis in the LAD.  Atherosclerotic calcifications of the aortic arch. Mild ectasia of the ascending thoracic aortic, measuring 3.4 cm.  Small mediastinal nodes, including a 5 mm AP window node in a 6 mm short axis prevascular node. 2.2 x 2.5 cm left hilar node (series 2/image 35), previously 1.8 x 2.6 cm.  Visualized thyroid is unremarkable.  Lungs/Pleura: 5.6 x 4.4 cm area of mass-like consolidation in the left lower lobe (series 2/image 46), corresponding to a prior 8.0 x 4.7 cm mass.  Moderate to large left pleural effusion, partially loculated, with associated pleural-based nodularity (series 2/image 50).  Visualized right lung is clear.  Moderate centrilobular and paraseptal emphysematous changes.  No pneumothorax.  Musculoskeletal: Mild degenerative changes of the thoracic spine.  CT ABDOMEN PELVIS FINDINGS  Hepatobiliary: Possible 8 x 11 mm hypoenhancing lesion in the central right liver anterior to the intrahepatic IVC (series 2/image 54).  2.2 x 1.2 cm hypoenhancing metastasis  posteriorly in the lateral segment left hepatic lobe (series 2/image 60), previously 7 mm.  Gallbladder is notable for multiple layering gallstones, without associated inflammatory changes. No intrahepatic or extrahepatic ductal dilatation.  Pancreas: Within normal limits.  Spleen: Within normal limits.  Adrenals/Urinary Tract: Adrenal glands unremarkable.  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  Right kidney is within normal limits.  No hydronephrosis.  Bladder is within normal limits.  Stomach/Bowel: Stomach is unremarkable.  No evidence of bowel obstruction.  Normal appendix.  Vascular/Lymphatic: Atherosclerotic calcifications of the abdominal aorta and branch vessels.  Fusiform ectasia of the infrarenal abdominal aorta, measuring 2.6 x 2.7 cm (series 2/image 37).  No suspicious abdominopelvic lymphadenopathy.  Reproductive: Status post hysterectomy.  No adnexal masses.  Other: No abdominopelvic ascites.  Musculoskeletal: 8 mm sclerotic lesion in the superior endplate of L4, new, suspicious (sagittal image 71). Additional sclerotic lesion in the sacrum (sagittal image 70), grossly unchanged.  IMPRESSION: Mixed response.  Left lower lobe mass has decreased in size.  Associated mediastinal/left hilar lymphadenopathy is mildly decreased. Moderate to large left pleural effusion, likely malignant, new/increased.  Suspected mild progression of hepatic metastases.  New 8 mm sclerotic lesion at L4, suspicious for osseous metastasis.  Additional ancillary findings as above.   Electronically Signed   By: Julian Hy M.D.   On: 07/11/2014 11:10   US Thoracentesis Asp Pleural Space W/img Guide  07/18/2014   CLINICAL DATA:  Shortness of breath. Left-sided pleural effusion. Request diagnostic and therapeutic thoracentesis.  EXAM: ULTRASOUND GUIDED LEFT THORACENTESIS  COMPARISON:  None.  PROCEDURE: An ultrasound guided thoracentesis was thoroughly discussed with the patient and  questions answered. The benefits, risks, alternatives and complications were also discussed. The patient understands and wishes to proceed with the procedure. Written consent was obtained.  Ultrasound was performed to localize and mark an adequate pocket of fluid in the left chest. The area was then prepped and draped in the normal sterile fashion. 1% Lidocaine was used for local anesthesia. Under ultrasound guidance a 19 gauge Yueh catheter was introduced. Thoracentesis was performed. The catheter was removed and a dressing applied.  COMPLICATIONS: None  FINDINGS: A total of approximately 750 mL of clear, dark amber colored fluid was removed. A fluid sample wassent for laboratory analysis.  IMPRESSION: Successful ultrasound guided left thoracentesis yielding 750 mL of pleural fluid.  Read by: Ascencion Dike PA-C   Electronically Signed   By: Corrie Mckusick D.O.   On: 07/18/2014 11:27    Medications: Scheduled Meds: .  atorvastatin  10 mg Oral QHS  . ceFEPime (MAXIPIME) IV  1 g Intravenous Q12H  . cholecalciferol  1,000 Units Oral Daily  . dexamethasone  4 mg Oral TID  . dronabinol  2.5 mg Oral BID AC  . enoxaparin (LOVENOX) injection  1 mg/kg Subcutaneous Q12H  . folic acid  1 mg Oral Daily  . hydrocortisone sod succinate (SOLU-CORTEF) inj  50 mg Intravenous Q12H  . levETIRAcetam  1,000 mg Oral BID  . mirtazapine  15 mg Oral QHS  . potassium chloride  40 mEq Oral Once  . sodium chloride  3 mL Intravenous Q12H  . vancomycin  750 mg Intravenous Q12H   Time spent 25 minutes   LOS: 2 days   Taygen Newsome M.D. [facility] 08/03/2014, 12:25 PM Pager: [phone]  If 7PM-7AM, please contact night-coverage [email or web address] Password [email or web address]

## 2014-08-03 NOTE — Evaluation (Signed)
Physical Therapy Evaluation Patient Details Name: Michelle Cobb MRN: [id number] DOB: 26-Jul-1940 Today's Date: 08/03/2014   History of Present Illness  74 y.o. 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 Pt currently with functional limitations due to the deficits listed below (see PT Problem List).  Pt will benefit from skilled PT to increase their independence and safety with mobility to allow discharge to the venue listed below.   Pt self limiting mobility today, only agreeable to sit EOB.  Pt does report living alone however does not give clear answers to previous level of function and would therefore recommend SNF at this time.       Follow Up Recommendations SNF    Equipment Recommendations  Rolling walker with 5" wheels;Wheelchair (measurements PT) (?unknown equipment at home)    Recommendations for Other Services       Precautions / Restrictions Precautions Precautions: Fall      Mobility  Bed Mobility Overal bed mobility: Needs Assistance Bed Mobility: Supine to Sit;Sit to Supine     Supine to sit: Supervision;HOB elevated Sit to supine: Supervision;HOB elevated   General bed mobility comments: no physical assist required  Transfers Overall transfer level:  (pt refused, stating too weak and tired)                  Ambulation/Gait                Stairs            Wheelchair Mobility    Modified Rankin (Stroke Patients Only)       Balance                                             Pertinent Vitals/Pain Pain Assessment: 0-10 Pain Score:  (did not  rate) Pain Location: L flank "from cancer" Pain Descriptors / Indicators: Aching Pain Intervention(s): Limited activity within patient's tolerance;Repositioned    Home Living Family/patient expects to be discharged to:: Private residence Living Arrangements: Alone   Type of Home: House       Home Layout: One level Home Equipment: Wheelchair - manual Additional Comments: uncertain of equipment as pt reports she ambulates but then states uses w/c however, unable to achieve straightforward answers    Prior Function Level of Independence: Independent               Hand Dominance        Extremity/Trunk Assessment               Lower Extremity Assessment: Generalized weakness         Communication   Communication: No difficulties  Cognition Arousal/Alertness: Awake/alert Behavior During Therapy: WFL for tasks assessed/performed Overall Cognitive Status: No family/caregiver present to determine baseline cognitive functioning (pt alert however would not provide straightforward answers, poor reasoning)                      General Comments      Exercises        Assessment/Plan    PT Assessment Patient needs continued PT services  PT Diagnosis Difficulty walking;Generalized weakness   PT Problem List Decreased strength;Decreased activity tolerance;Decreased mobility  PT Treatment Interventions DME instruction;Gait training;Functional mobility training;Patient/family education;Therapeutic activities;Therapeutic exercise   PT Goals (Current goals can be found in the Care Plan section) Acute Rehab PT Goals PT Goal Formulation: With patient Time For Goal Achievement: 08/17/14 Potential to Achieve Goals: Good    Frequency Min 2X/week   Barriers to discharge        Co-evaluation               End of Session   Activity Tolerance: Other (comment) (self limiting) Patient left: in bed;with call bell/phone within reach;with bed alarm  set Nurse Communication: Mobility status         Time: 0920-0415 PT Time Calculation (min) (ACUTE ONLY): 18 min   Charges:   PT Evaluation $Initial PT Evaluation Tier I: 1 Procedure     PT G Codes:        Daijon Wenke,KATHrine E 08/03/2014, 11:08 AM Carmelia Bake, PT, DPT 08/03/2014 Pager: [phone]

## 2014-08-04 LAB — URINALYSIS, ROUTINE W REFLEX MICROSCOPIC
Bilirubin Urine: NEGATIVE
Glucose, UA: NEGATIVE mg/dL
Hgb urine dipstick: NEGATIVE
Ketones, ur: NEGATIVE mg/dL
NITRITE: NEGATIVE
Protein, ur: 30 mg/dL — AB
Specific Gravity, Urine: 1.02 (ref 1.005–1.030)
Urobilinogen, UA: 0.2 mg/dL (ref 0.0–1.0)
pH: 6 (ref 5.0–8.0)

## 2014-08-04 LAB — BASIC METABOLIC PANEL
ANION GAP: 9 (ref 5–15)
BUN: 14 mg/dL (ref 6–23)
CALCIUM: 7.8 mg/dL — AB (ref 8.4–10.5)
CO2: 21 mmol/L (ref 19–32)
CREATININE: 0.69 mg/dL (ref 0.50–1.10)
Chloride: 110 mmol/L (ref 96–112)
GFR calc non Af Amer: 84 mL/min — ABNORMAL LOW (ref >90)
Glucose, Bld: 118 mg/dL — ABNORMAL HIGH (ref 70–99)
POTASSIUM: 3.6 mmol/L (ref 3.5–5.1)
Sodium: 140 mmol/L (ref 135–145)

## 2014-08-04 LAB — CBC
HCT: 24.9 % — ABNORMAL LOW (ref 36.0–46.0)
Hemoglobin: 8.4 g/dL — ABNORMAL LOW (ref 12.0–15.0)
MCH: 25 pg — ABNORMAL LOW (ref 26.0–34.0)
MCHC: 33.7 g/dL (ref 30.0–36.0)
MCV: 74.1 fL — AB (ref 78.0–100.0)
Platelets: 186 10*3/uL (ref 150–400)
RBC: 3.36 MIL/uL — AB (ref 3.87–5.11)
RDW: 18 % — ABNORMAL HIGH (ref 11.5–15.5)
WBC: 16.8 10*3/uL — AB (ref 4.0–10.5)

## 2014-08-04 LAB — VANCOMYCIN, TROUGH: Vancomycin Tr: 24.4 ug/mL — ABNORMAL HIGH (ref 10.0–20.0)

## 2014-08-04 LAB — GLUCOSE, CAPILLARY: Glucose-Capillary: 109 mg/dL — ABNORMAL HIGH (ref 70–99)

## 2014-08-04 LAB — URINE MICROSCOPIC-ADD ON

## 2014-08-04 MED ORDER — MENTHOL 3 MG MT LOZG
1.0000 | LOZENGE | OROMUCOSAL | Status: DC | PRN
  Filled 2014-08-04: qty 9

## 2014-08-04 MED ORDER — SODIUM CHLORIDE 0.9 % IJ SOLN: 3.0000 mL | INTRAMUSCULAR | Status: DC | PRN

## 2014-08-04 MED ORDER — HYDROCORTISONE NA SUCCINATE PF 100 MG IJ SOLR
25.0000 mg | Freq: Two times a day (BID) | INTRAMUSCULAR | Status: DC
Start: 2014-08-04 — End: 2014-08-05
  Administered 2014-08-04 – 2014-08-05 (×3): 25 mg via INTRAVENOUS
  Filled 2014-08-04 (×3): qty 0.5

## 2014-08-04 MED ORDER — METRONIDAZOLE 500 MG PO TABS
500.0000 mg | ORAL_TABLET | Freq: Two times a day (BID) | ORAL | Status: DC
  Administered 2014-08-04 – 2014-08-07 (×7): 500 mg via ORAL
  Filled 2014-08-04 (×7): qty 1

## 2014-08-04 MED ORDER — SODIUM CHLORIDE 0.9 % IV SOLN: 250.0000 mL | INTRAVENOUS | Status: DC | PRN

## 2014-08-04 MED ORDER — BENZONATATE 100 MG PO CAPS
100.0000 mg | ORAL_CAPSULE | Freq: Three times a day (TID) | ORAL | Status: DC
  Administered 2014-08-04 – 2014-08-07 (×10): 100 mg via ORAL
  Filled 2014-08-04 (×15): qty 1

## 2014-08-04 MED ORDER — VANCOMYCIN HCL 500 MG IV SOLR
500.0000 mg | Freq: Two times a day (BID) | INTRAVENOUS | Status: DC
  Administered 2014-08-05 (×2): 500 mg via INTRAVENOUS
  Filled 2014-08-04 (×2): qty 500

## 2014-08-04 MED ORDER — SODIUM CHLORIDE 0.9 % IJ SOLN
3.0000 mL | Freq: Two times a day (BID) | INTRAMUSCULAR | Status: DC
  Administered 2014-08-04 – 2014-08-07 (×2): 3 mL via INTRAVENOUS

## 2014-08-04 NOTE — Progress Notes (Signed)
ANTIBIOTIC CONSULT NOTE   Pharmacy Consult for Vancomycin & Cefepime Indication: sepsis  No Known Allergies  Patient Measurements: Height: 5' 7.5" (171.5 cm) Weight: 173 lb 1 oz (78.5 kg) IBW/kg (Calculated) : 62.75  Vital Signs: Temp: 97.5 F (36.4 C) (03/13 1408) Temp Source: Oral (03/13 1408) BP: 115/71 mmHg (03/13 1408) Pulse Rate: 86 (03/13 1408) Intake/Output from previous day: 03/12 0701 - 03/13 0700 In: 3360.4 [P.O.:600; I.V.:2660.4; IV Piggyback:100] Out: 850 [Urine:850] Intake/Output from this shift: Total I/O In: 240 [P.O.:240] Out: 550 [Urine:550]  Labs:  Recent Labs  08/02/14 0135 08/03/14 0540 08/04/14 0415  WBC 21.3* 18.5* 16.8*  HGB 9.9* 9.5* 8.4*  PLT 275 173 186  CREATININE 1.03 0.73 0.69   Estimated Creatinine Clearance: 68.3 mL/min (by C-G formula based on Cr of 0.69). No results for input(s): VANCOTROUGH, VANCOPEAK, VANCORANDOM, GENTTROUGH, GENTPEAK, GENTRANDOM, TOBRATROUGH, TOBRAPEAK, TOBRARND, AMIKACINPEAK, AMIKACINTROU, AMIKACIN in the last 72 hours.   Microbiology: Recent Results (from the past 720 hour(s))  Body fluid culture     Status: None   Collection Time: 07/18/14 10:46 AM  Result Value Ref Range Status   Specimen Description PLEURAL LEFT  Final   Special Requests NONE  Final   Gram Stain   Final    RARE WBC PRESENT,BOTH PMN AND MONONUCLEAR NO ORGANISMS SEEN Performed at [facility]    Culture   Final    NO GROWTH 3 DAYS Performed at [facility]    Report Status 07/21/2014 FINAL  Final  Blood Culture (routine x 2)     Status: None (Preliminary result)   Collection Time: 08/01/14  6:54 PM  Result Value Ref Range Status   Specimen Description RIGHT ANTECUBITAL  Final   Special Requests BOTTLES DRAWN AEROBIC AND ANAEROBIC 5CC  Final   Culture   Final           BLOOD CULTURE RECEIVED NO GROWTH TO DATE CULTURE WILL BE HELD FOR 5 DAYS BEFORE ISSUING A FINAL NEGATIVE REPORT Performed at [facility]    Report Status PENDING  Incomplete  Blood Culture (routine x 2)     Status: None (Preliminary result)   Collection Time: 08/01/14  8:15 PM  Result Value Ref Range Status   Specimen Description BLOOD RIGHT HAND  Final   Special Requests BOTTLES DRAWN AEROBIC AND ANAEROBIC 4CC  Final   Culture   Final           BLOOD CULTURE RECEIVED NO GROWTH TO DATE CULTURE WILL BE HELD FOR 5 DAYS BEFORE ISSUING A FINAL NEGATIVE REPORT Performed at [facility]    Report Status PENDING  Incomplete   Assessment: 59 yoF with h/o NSLC with brain mets presents with shortness of breath, nausea/vomiting. Patient had outpatient MRI today. Code sepsis called.  Pharmacy contacted EDP 30 minutes after code was alerted to obtain antibiotic orders. Pharmacy consulted to dose Vancomycin and Zosyn.  Vancomycin 1g and Zosyn 3.375g x 1 ordered by EDP.  RN contacted to administer fluid bolus and antibiotics within the hour.  Patient had been transferred to xray at that time and RN communicated that she would administer antibiotics as soon as patient returns.  Abx changed to Vancomycin & Cefepime  Day 4 abx  3/10 Blood cx: ngtd 3/11 Resp virus panel: negative 3/11 Strep pneumo/ Legionella: negative/pending 3/12 Urine: ng  Goal of Therapy:  Vancomycin trough level 15-20 mcg/ml   Plan:   Vancomycin 750 mg IV q12h.  Cefepime 1gm q12  Obtain trough tonight prior to 6th dose  Minda Ditto PharmD Pager [phone] 08/04/2014, 6:45 PM

## 2014-08-04 NOTE — Clinical Social Work Psychosocial (Signed)
Clinical Social Work Department BRIEF PSYCHOSOCIAL ASSESSMENT 08/04/2014  Patient:  Michelle Cobb, Michelle Cobb     Account Number:  [id number]     [place] date:  08/01/2014  Clinical Social Worker:  Dede Query, CLINICAL SOCIAL WORKER  Date/Time:  08/04/2014 05:40 PM  Referred by:  Physician  Date Referred:  08/04/2014 Referred for  SNF Placement   Other Referral:   Interview type:  Family Other interview type:    PSYCHOSOCIAL DATA Living Status:  ALONE Admitted from facility:   Level of care:   Primary support name:  [facility] Primary support relationship to patient:  FAMILY Degree of support available:   High but cousin lives in [place]  Other Concerns:    [place] / PLAN  CSW reviewed pt chart which reflected pt was disoriented x 4 so CSW called pt's [facility]    Assessment/plan status:  Psychosocial Support/Ongoing Assessment of Needs Other assessment/ plan:   Information/referral to community resources:   CSW can call cousin Lawerance Cruel [phone] for further help with placement needs    PATIENT'S/FAMILY'S RESPONSE TO PLAN OF CARE:  CSW reviewed chart which reflected that pt was disoriented x4.  CSW called and spoke with pt's [place] [phone] to gather informtion.  Pt's godson stated that pt lives alone and does not have family in town.  Pt's godson stated that it would "not be an easy sell" getting pt to agree to [facility]  pt's god son stated that all of pt's family lives in [place] and they have tried to get her to move there.  Pt's godson stated that he stayed with pt December and january to help with doctor appointment and he noticed she couldnt remember things. Pt's godson stated that pt has a son and a sister but she is not close to them.  Pt's godson asked to be called for additional help   .Dede Query, LCSW [facility] Clinical Social Worker - Weekend Coverage cell #: [phone]

## 2014-08-04 NOTE — Progress Notes (Signed)
Patient ID: Michelle Cobb  female  JOI:[id number]    DOB: [birth year]    DOA: 08/01/2014  PCP: Woody Seller, MD    Brief history of present illness Michelle Cobb is a 74 y.o. 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 Patient reports she started having worsening cough, runny nose, but no sore throat today. She felt whole-body aching and feels very weak. She coughed up some white colored mucus. No significant chest pain. She also had nausea, vomiting and diarrhea. She reported that she has diarrhea with loose stool and vomiting all day before admission. She did not have significant abdominal pain. Patient had fever and chills. Patient denied dysuria, urgency, frequency, hematuria, skin rashes or leg swelling. No unilateral weakness, numbness or tingling sensations. No vision change or hearing loss. In ED, patient was found to have leukocytosis with WBC 21.8, fever with temperature 101.2, soft blood pressure, tachycardia. X-ray showed stable moderate left pleural effusion and adjacent basilar consolidation/atelectasis. Patient was admitted to inpatient for further evaluation and treatment.    Assessment/Plan: Principal Problem:   Sepsis likely due to pneumonia, gastroenteritis and UTI, : Improving, influenza ruled out -  Pro-calcitonin high 30.56, leukocytosis  improving 18.5 (patient is also on steroids ) continue vancomycin, cefepime  - Leukocytosis also likely due to Decadron,BP currently stopped, continue IV fluids and IV hydrocortisone, will taper   - UA positive for UTI however urine culture not done at admission, reordered - Added antitussives  Active  Problems: Trichomonas on UA - will start Flagyl IV 500 twice a day for 7 days however I will repeat UA  Nausea, vomiting, diarrhea - Resolved, discontinue IV fluids, tolerating diet    Hyperlipidemia - Continue Lipitor  LLL lung adenocarcinoma, [id number], 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 s/p SBRT on 05/09/14. Patent has been followed up by Dr. Evelena Leyden. Recently received Nivolumab immunotherapy.     Brain metastasis - Continue Decadron, on stress dose steroids  DVT - Continue home dose of Lovenox  History of seizures Continue Keppra   DVT Prophylaxis:Lovenox  Code Status: DO NOT RESUSCITATE  Family Communication:  no family member at the bedside, Discussed in detail with the patient, all imaging results, lab results explained to the patient.  PT evaluation recommending skilled nursing facility    Disposition:  Consultants: None   Procedures: None   Antibiotics:  IV vancomycin  IV cefepime  Subjective: Patient seen and examined, coughing today, no fevers or chills, no nausea vomiting or diarrhea  Objective: Weight change:   Intake/Output Summary (Last 24 hours) at 08/04/14 1114 Last data filed at 08/04/14 0832  Gross per 24 hour  Intake 2880.42 ml  Output   1150 ml  Net 1730.42 ml   Blood pressure 103/61, pulse 76, temperature 98.1 F (36.7 C), temperature source Oral, resp. rate 19, height 5' 7.5" (1.715 m), weight 78.5 kg (173 lb 1 oz), SpO2 100 %.  Physical Exam: General: Alert and oriented 3, NAD  CVS: S1-S2 clear, no murmur rubs or gallops Chest:Decreased breath sounds at the bases  Abdomen: soft nontender, nondistended, normal bowel sounds  Extremities: no cyanosis, clubbing or edema noted bilaterally   Lab Results: Basic Metabolic Panel:  Recent Labs Lab 08/03/14 0540 08/04/14 0415  NA 140 140  K 3.9 3.6  CL 111 110  CO2 20 21  GLUCOSE 106* 118*  BUN 18 14  CREATININE 0.73 0.69  CALCIUM 7.3* 7.8*   Liver Function  Tests:  Recent Labs Lab 08/01/14 1854 08/02/14 0135  AST 37 28  ALT 19 16  ALKPHOS 61 49  BILITOT 1.5* 1.0  PROT 7.9 5.7*  ALBUMIN 2.9* 2.1*   No results for input(s): LIPASE, AMYLASE in the last 168 hours. No results for input(s): AMMONIA in the last 168 hours. CBC:  Recent Labs Lab 08/03/14 0540 08/04/14 0415  WBC 18.5* 16.8*  HGB 9.5* 8.4*  HCT 28.1* 24.9*  MCV 75.3* 74.1*  PLT 173 186   Cardiac Enzymes: No results for input(s): CKTOTAL, CKMB, CKMBINDEX, TROPONINI in the last 168 hours. BNP: Invalid input(s): POCBNP CBG:  Recent Labs Lab 08/02/14 0739 08/03/14 0743 08/04/14 0734  GLUCAP 112* 106* 109*     Micro Results: Recent Results (from the past 240 hour(s))  Blood Culture (routine x 2)     Status: None (Preliminary result)   Collection Time: 08/01/14  6:54 PM  Result Value Ref Range Status   Specimen Description RIGHT ANTECUBITAL  Final   Special Requests BOTTLES DRAWN AEROBIC AND ANAEROBIC 5CC  Final   Culture   Final           BLOOD CULTURE RECEIVED NO GROWTH TO DATE CULTURE WILL BE HELD FOR 5 DAYS BEFORE ISSUING A FINAL NEGATIVE REPORT Performed at [facility]    Report Status PENDING  Incomplete  Blood Culture (routine x 2)     Status: None (Preliminary result)   Collection Time: 08/01/14  8:15 PM  Result Value Ref Range Status   Specimen Description BLOOD RIGHT HAND  Final   Special Requests BOTTLES DRAWN AEROBIC AND ANAEROBIC 4CC  Final   Culture   Final           BLOOD CULTURE RECEIVED NO GROWTH TO DATE CULTURE WILL BE HELD FOR 5 DAYS BEFORE ISSUING A FINAL NEGATIVE REPORT Performed at [facility]    Report Status PENDING  Incomplete    Studies/Results: Dg Chest 1 View  07/18/2014   CLINICAL DATA:  Pain following thoracentesis.  Left-sided effusion.  EXAM: CHEST  1 VIEW  COMPARISON:  CT chest with contrast 07/11/2014.  FINDINGS: The heart size is normal. There is a significant decrease in the left pleural effusion.  There is no pneumothorax. The right lung is clear. The visualized soft tissues and bony thorax are otherwise unremarkable.  IMPRESSION: 1. Decreased left pleural effusion following thoracentesis. 2. No significant pneumothorax or other complication evident by radiograph. 3. The right lung is clear.   Electronically Signed   By: San Morelle M.D.   On: 07/18/2014 12:07   Dg Chest 2 View  08/01/2014   CLINICAL DATA:  Metastatic lung ca - cough - SOB - Pt resides at home. Pt c/o of SOB N/V. Symptoms started after returning home from here for MRI today  EXAM: CHEST - 2 VIEW  COMPARISON:  07/23/2014  FINDINGS: Moderate left pleural effusion. Consolidation/ atelectasis at the left lung base as before. Right lung clear. Heart size difficult to assess due to adjacent opacity. No pneumothorax Visualized skeletal structures are unremarkable.  IMPRESSION: 1. Stable moderate left pleural effusion and adjacent basilar consolidation/atelectasis.   Electronically Signed   By: Lucrezia Europe M.D.   On: 08/01/2014 20:10   Dg Chest 2 View  07/23/2014  CLINICAL DATA:  Focal left arm seizures; known history of brain cancer and lung cancer. Initial encounter.  EXAM: CHEST  2 VIEW  COMPARISON:  Chest radiograph performed 07/18/2014  FINDINGS: There is mildly increased moderate left pleural effusion with associated airspace opacity. The right lung appears clear. There is mild rightward mediastinal shift, likely reflecting the pleural effusion. No pneumothorax is seen. The underlying left lower lobe lung mass is not well characterized on radiograph.  The cardiomediastinal silhouette remains borderline normal in size. No acute osseous abnormalities are identified.  IMPRESSION: Mildly increased moderate left pleural effusion, with associated airspace opacity. Underlying known lung mass not well characterized on radiograph.   Electronically Signed   By: Jeffery  Chang M.D.   On: 07/23/2014 18:17   Ct Head Wo Contrast  07/23/2014    CLINICAL DATA:  Focal seizure  EXAM: CT HEAD WITHOUT CONTRAST  TECHNIQUE: Contiguous axial images were obtained from the base of the skull through the vertex without intravenous contrast.  COMPARISON:  04/18/2014  FINDINGS: Skull and Sinuses:Negative for fracture or destructive process. The mastoids, middle ears, and imaged paranasal sinuses are clear.  Orbits: Dysconjugate gaze.  Brain: Known cerebral metastases in the bilateral posterior cerebral hemispheres, parietal on the right and frontal parietal on the left. The masses are cortically based on the right and subcortical on the left. Edema surrounding the left-sided metastasis has increased, but there is no midline shift or herniation, and the metastasis has increased in diameter to 18 mm (13 mm by contrast-enhanced MRI 05/01/2014). The edema tracks into the external and internal capsules posteriorly, making the posterior putamen prominent. Low-attenuation in the bilateral anterior internal capsules is chronic, likely from multiple dilated perivascular spaces and white matter gliosis based on previous brain MRI. There is no evidence of acute hemorrhage. No acute infarct or hydrocephalus.  IMPRESSION: Known brain metastases. Compared to 05/01/2014 brain MRI, a left posterior frontal metastasis has increased size and vasogenic edema.   Electronically Signed   By: Jonathon  Watts M.D.   On: 07/23/2014 21:09   Ct Chest W Contrast  07/11/2014   CLINICAL DATA:  Stage IV left lung cancer, after chemotherapy  EXAM: CT CHEST, ABDOMEN, AND PELVIS WITH CONTRAST  TECHNIQUE: Multidetector CT imaging of the chest, abdomen and pelvis was performed following the standard protocol during bolus administration of intravenous contrast.  CONTRAST:  100mL OMNIPAQUE IOHEXOL 300 MG/ML  SOLN  COMPARISON:  None.  FINDINGS: CT CHEST FINDINGS  Mediastinum/Nodes: Heart is normal in size. No pericardial effusion.  Coronary atherosclerosis in the LAD.  Atherosclerotic calcifications of  the aortic arch. Mild ectasia of the ascending thoracic aortic, measuring 3.4 cm.  Small mediastinal nodes, including a 5 mm AP window node in a 6 mm short axis prevascular node. 2.2 x 2.5 cm left hilar node (series 2/image 35), previously 1.8 x 2.6 cm.  Visualized thyroid is unremarkable.  Lungs/Pleura: 5.6 x 4.4 cm area of mass-like consolidation in the left lower lobe (series 2/image 46), corresponding to a prior 8.0 x 4.7 cm mass.  Moderate to large left pleural effusion, partially loculated, with associated pleural-based nodularity (series 2/image 50).  Visualized right lung is clear.  Moderate centrilobular and paraseptal emphysematous changes.  No pneumothorax.  Musculoskeletal: Mild degenerative changes of the thoracic spine.  CT ABDOMEN PELVIS FINDINGS  Hepatobiliary: Possible 8 x 11 mm hypoenhancing lesion in the central right liver anterior to the intrahepatic IVC (series 2/image 54).  2.2 x 1.2 cm hypoenhancing metastasis posteriorly in the   lateral segment left hepatic lobe (series 2/image 60), previously 7 mm.  Gallbladder is notable for multiple layering gallstones, without associated inflammatory changes. No intrahepatic or extrahepatic ductal dilatation.  Pancreas: Within normal limits.  Spleen: Within normal limits.  Adrenals/Urinary Tract: Adrenal glands unremarkable.  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  Right kidney is within normal limits.  No hydronephrosis.  Bladder is within normal limits.  Stomach/Bowel: Stomach is unremarkable.  No evidence of bowel obstruction.  Normal appendix.  Vascular/Lymphatic: Atherosclerotic calcifications of the abdominal aorta and branch vessels.  Fusiform ectasia of the infrarenal abdominal aorta, measuring 2.6 x 2.7 cm (series 2/image 37).  No suspicious abdominopelvic lymphadenopathy.  Reproductive: Status post hysterectomy.  No adnexal masses.  Other: No abdominopelvic ascites.  Musculoskeletal: 8 mm sclerotic lesion  in the superior endplate of L4, new, suspicious (sagittal image 71). Additional sclerotic lesion in the sacrum (sagittal image 70), grossly unchanged.  IMPRESSION: Mixed response.  Left lower lobe mass has decreased in size.  Associated mediastinal/left hilar lymphadenopathy is mildly decreased. Moderate to large left pleural effusion, likely malignant, new/increased.  Suspected mild progression of hepatic metastases.  New 8 mm sclerotic lesion at L4, suspicious for osseous metastasis.  Additional ancillary findings as above.   Electronically Signed   By: Sriyesh  Krishnan M.D.   On: 07/11/2014 11:10   Mr Brain W Wo Contrast  07/29/2014   CLINICAL DATA:  Metastatic lung cancer. Three month stereotactic radiosurgery follow-up  EXAM: MRI HEAD WITHOUT AND WITH CONTRAST  TECHNIQUE: Multiplanar, multiecho pulse sequences of the brain and surrounding structures were obtained without and with intravenous contrast.  CONTRAST:  16 mL MultiHance IV  COMPARISON:  MRI 05/01/2014  FINDINGS: Multiple metastatic deposits. Increase in edema in the left parietal lobe secondary to enlarging hemorrhagic metastatic deposit. Right parietal edema also has progressed somewhat. Ventricle size remains normal. No significant shift of the midline structures. No acute infarct.  Punctate calcification right cerebellum unchanged without enhancement.  3 mm enhancing nodule right superior cerebellum unchanged consistent with metastatic deposit.  2 mm enhancing nodule central pons unchanged.  19 x 20 mm hemorrhagic lesion left parietal lobe has increased in size since the prior study. Increased surrounding white matter edema.  11 x 13 mm hemorrhagic enhancing lesion high right parietal lobe is similar in size. Slight increase in surrounding edema.  New enhancing lesion left frontal lobe over the convexity measuring 4 x 6.6 mm. This is consistent with metastatic disease and is new.  Calvarium is negative.  Paranasal sinuses clear  IMPRESSION:  Multiple metastatic deposits are present as described above.  Hemorrhagic lesion left parietal lobe has increased in size with increase in surrounding edema.  New lesion over the left frontal convexity measuring 4 x 6.6 mm.  Right high parietal lesion is similar in size but slightly increased edema since the prior study. Other lesions are stable.   Electronically Signed   By: Charles  Clark M.D.   On: 07/29/2014 15:06   Ct Abdomen Pelvis W Contrast  07/11/2014   CLINICAL DATA:  Stage IV left lung cancer, after chemotherapy  EXAM: CT CHEST, ABDOMEN, AND PELVIS WITH CONTRAST  TECHNIQUE: Multidetector CT imaging of the chest, abdomen and pelvis was performed following the standard protocol during bolus administration of intravenous contrast.  CONTRAST:  100mL OMNIPAQUE IOHEXOL 300 MG/ML  SOLN  COMPARISON:  None.  FINDINGS: CT CHEST FINDINGS  Mediastinum/Nodes: Heart is normal in size. No pericardial effusion.  Coronary   atherosclerosis in the LAD.  Atherosclerotic calcifications of the aortic arch. Mild ectasia of the ascending thoracic aortic, measuring 3.4 cm.  Small mediastinal nodes, including a 5 mm AP window node in a 6 mm short axis prevascular node. 2.2 x 2.5 cm left hilar node (series 2/image 35), previously 1.8 x 2.6 cm.  Visualized thyroid is unremarkable.  Lungs/Pleura: 5.6 x 4.4 cm area of mass-like consolidation in the left lower lobe (series 2/image 46), corresponding to a prior 8.0 x 4.7 cm mass.  Moderate to large left pleural effusion, partially loculated, with associated pleural-based nodularity (series 2/image 50).  Visualized right lung is clear.  Moderate centrilobular and paraseptal emphysematous changes.  No pneumothorax.  Musculoskeletal: Mild degenerative changes of the thoracic spine.  CT ABDOMEN PELVIS FINDINGS  Hepatobiliary: Possible 8 x 11 mm hypoenhancing lesion in the central right liver anterior to the intrahepatic IVC (series 2/image 54).  2.2 x 1.2 cm hypoenhancing metastasis  posteriorly in the lateral segment left hepatic lobe (series 2/image 60), previously 7 mm.  Gallbladder is notable for multiple layering gallstones, without associated inflammatory changes. No intrahepatic or extrahepatic ductal dilatation.  Pancreas: Within normal limits.  Spleen: Within normal limits.  Adrenals/Urinary Tract: Adrenal glands unremarkable.  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  Right kidney is within normal limits.  No hydronephrosis.  Bladder is within normal limits.  Stomach/Bowel: Stomach is unremarkable.  No evidence of bowel obstruction.  Normal appendix.  Vascular/Lymphatic: Atherosclerotic calcifications of the abdominal aorta and branch vessels.  Fusiform ectasia of the infrarenal abdominal aorta, measuring 2.6 x 2.7 cm (series 2/image 37).  No suspicious abdominopelvic lymphadenopathy.  Reproductive: Status post hysterectomy.  No adnexal masses.  Other: No abdominopelvic ascites.  Musculoskeletal: 8 mm sclerotic lesion in the superior endplate of L4, new, suspicious (sagittal image 71). Additional sclerotic lesion in the sacrum (sagittal image 70), grossly unchanged.  IMPRESSION: Mixed response.  Left lower lobe mass has decreased in size.  Associated mediastinal/left hilar lymphadenopathy is mildly decreased. Moderate to large left pleural effusion, likely malignant, new/increased.  Suspected mild progression of hepatic metastases.  New 8 mm sclerotic lesion at L4, suspicious for osseous metastasis.  Additional ancillary findings as above.   Electronically Signed   By: Julian Hy M.D.   On: 07/11/2014 11:10   US Thoracentesis Asp Pleural Space W/img Guide  07/18/2014   CLINICAL DATA:  Shortness of breath. Left-sided pleural effusion. Request diagnostic and therapeutic thoracentesis.  EXAM: ULTRASOUND GUIDED LEFT THORACENTESIS  COMPARISON:  None.  PROCEDURE: An ultrasound guided thoracentesis was thoroughly discussed with the patient and  questions answered. The benefits, risks, alternatives and complications were also discussed. The patient understands and wishes to proceed with the procedure. Written consent was obtained.  Ultrasound was performed to localize and mark an adequate pocket of fluid in the left chest. The area was then prepped and draped in the normal sterile fashion. 1% Lidocaine was used for local anesthesia. Under ultrasound guidance a 19 gauge Yueh catheter was introduced. Thoracentesis was performed. The catheter was removed and a dressing applied.  COMPLICATIONS: None  FINDINGS: A total of approximately 750 mL of clear, dark amber colored fluid was removed. A fluid sample wassent for laboratory analysis.  IMPRESSION: Successful ultrasound guided left thoracentesis yielding 750 mL of pleural fluid.  Read by: Ascencion Dike PA-C   Electronically Signed   By: Corrie Mckusick D.O.   On: 07/18/2014 11:27    Medications: Scheduled Meds: .  atorvastatin  10 mg Oral QHS  . benzonatate  100 mg Oral TID  . ceFEPime (MAXIPIME) IV  1 g Intravenous Q12H  . cholecalciferol  1,000 Units Oral Daily  . dexamethasone  4 mg Oral TID  . dronabinol  2.5 mg Oral BID AC  . enoxaparin (LOVENOX) injection  1 mg/kg Subcutaneous Q12H  . folic acid  1 mg Oral Daily  . hydrocortisone sod succinate (SOLU-CORTEF) inj  50 mg Intravenous Q12H  . levETIRAcetam  1,000 mg Oral BID  . metroNIDAZOLE  500 mg Oral Q12H  . mirtazapine  15 mg Oral QHS  . potassium chloride  40 mEq Oral Once  . sodium chloride  3 mL Intravenous Q12H  . vancomycin  750 mg Intravenous Q12H   Time spent 25 minutes   LOS: 3 days   Emilea Goga M.D. [facility] 08/04/2014, 11:14 AM Pager: [phone]  If 7PM-7AM, please contact night-coverage [email or web address] Password [email or web address]

## 2014-08-04 NOTE — Progress Notes (Signed)
ANTIBIOTIC CONSULT NOTE - FOLLOW UP  Pharmacy Consult for Vancomycin  Indication: rule out sepsis  No Known Allergies  Patient Measurements: Height: 5' 7.5" (171.5 cm) Weight: 173 lb 1 oz (78.5 kg) IBW/kg (Calculated) : 62.75  Vital Signs: Temp: 98.1 F (36.7 C) (03/13 2007) Temp Source: Oral (03/13 2007) BP: 109/67 mmHg (03/13 2007) Pulse Rate: 85 (03/13 2007)  Labs:  Recent Labs  08/02/14 0135 08/03/14 0540 08/04/14 0415  WBC 21.3* 18.5* 16.8*  HGB 9.9* 9.5* 8.4*  PLT 275 173 186  CREATININE 1.03 0.73 0.69   Estimated Creatinine Clearance: 68.3 mL/min (by C-G formula based on Cr of 0.69).  Recent Labs  08/04/14 1930  VANCOTROUGH 24.4*     Assessment: Supra-therapeutic vancomycin trough (drawn correctly), WBC remains elevated but trending down, renal function appears ok.   Goal of Therapy:  Vancomycin trough level 15-20 mcg/ml  Plan:  -Decrease vancomycin to 500 mg IV q12h -Repeat vancomycin trough at steady state  Narda Bonds 08/04/2014,8:54 PM

## 2014-08-05 DIAGNOSIS — E43 Unspecified severe protein-calorie malnutrition: Secondary | ICD-10-CM | POA: Insufficient documentation

## 2014-08-05 DIAGNOSIS — C7931 Secondary malignant neoplasm of brain: Secondary | ICD-10-CM | POA: Diagnosis not present

## 2014-08-05 LAB — BASIC METABOLIC PANEL
Anion gap: 7 (ref 5–15)
BUN: 16 mg/dL (ref 6–23)
CHLORIDE: 112 mmol/L (ref 96–112)
CO2: 23 mmol/L (ref 19–32)
CREATININE: 0.79 mg/dL (ref 0.50–1.10)
Calcium: 8.3 mg/dL — ABNORMAL LOW (ref 8.4–10.5)
GFR calc non Af Amer: 81 mL/min — ABNORMAL LOW (ref >90)
Glucose, Bld: 122 mg/dL — ABNORMAL HIGH (ref 70–99)
POTASSIUM: 3.6 mmol/L (ref 3.5–5.1)
Sodium: 142 mmol/L (ref 135–145)

## 2014-08-05 LAB — GLUCOSE, CAPILLARY: GLUCOSE-CAPILLARY: 106 mg/dL — AB (ref 70–99)

## 2014-08-05 LAB — CBC
HCT: 25.5 % — ABNORMAL LOW (ref 36.0–46.0)
Hemoglobin: 8.6 g/dL — ABNORMAL LOW (ref 12.0–15.0)
MCH: 25.1 pg — ABNORMAL LOW (ref 26.0–34.0)
MCHC: 33.7 g/dL (ref 30.0–36.0)
MCV: 74.6 fL — ABNORMAL LOW (ref 78.0–100.0)
PLATELETS: 171 10*3/uL (ref 150–400)
RBC: 3.42 MIL/uL — ABNORMAL LOW (ref 3.87–5.11)
RDW: 18.2 % — AB (ref 11.5–15.5)
WBC: 15.4 10*3/uL — ABNORMAL HIGH (ref 4.0–10.5)

## 2014-08-05 LAB — URINE CULTURE
CULTURE: NO GROWTH
Colony Count: NO GROWTH

## 2014-08-05 LAB — LEGIONELLA ANTIGEN, URINE

## 2014-08-05 LAB — EXPECTORATED SPUTUM ASSESSMENT W REFEX TO RESP CULTURE

## 2014-08-05 MED ORDER — LEVOFLOXACIN 500 MG PO TABS
500.0000 mg | ORAL_TABLET | Freq: Every day | ORAL | Status: DC
  Administered 2014-08-05 – 2014-08-07 (×3): 500 mg via ORAL
  Filled 2014-08-05 (×3): qty 1

## 2014-08-05 NOTE — Progress Notes (Signed)
[facility]  Patient Status: Active (receiving services up to time of hospitalization)  [facility] is providing the following services: RN and PT  If patient discharges after hours, please call [phone].   Michelle Cobb 08/05/2014, 3:09 PM

## 2014-08-05 NOTE — Progress Notes (Signed)
CSW called & spoke with patient's cousin, Doroteo Bradford (ph#: [phone]) who gave CSW patient's son, Gizelle Whetsel contact information (ph#: [phone]). CSW spoke with son who chose [facility]. CSW confirmed with Tammy @ SNF that they would be able to take patient when ready.   Clinical Social Work Department CLINICAL SOCIAL WORK PLACEMENT NOTE 08/05/2014  Patient:  Michelle Cobb, Michelle Cobb  Account Number:  [id number] [place] date:  08/01/2014  Clinical Social Worker:  Renold Genta  Date/time:  08/05/2014 02:54 PM  Clinical Social Work is seeking post-discharge placement for this patient at the following level of care:   SKILLED NURSING   (*CSW will update this form in Epic as items are completed)   08/05/2014  Patient/family provided with [place] Department of Clinical Social Work's list of facilities offering this level of care within the geographic area requested by the patient (or if unable, by the patient's family).  08/05/2014  Patient/family informed of their freedom to choose among providers that offer the needed level of care, that participate in Medicare, Medicaid or managed care program needed by the patient, have an available bed and are willing to accept the patient.  08/05/2014  Patient/family informed of [facility]' ownership interest in [facility], as well as of the fact that they are under no obligation to receive care at this facility.  PASARR submitted to EDS on 08/05/2014 PASARR number received on 08/05/2014  FL2 transmitted to all facilities in geographic area requested by pt/family on  08/05/2014 FL2 transmitted to all facilities within larger geographic area on   Patient informed that his/her managed care company has contracts with or will negotiate with  certain facilities, including the following:     Patient/family informed of bed offers received:  08/05/2014 Patient chooses bed at [facility] Physician recommends and patient chooses bed at    Patient to be transferred to  on   Patient to be transferred to facility by  Patient and family notified of transfer on  Name of family member notified:    The following physician request were entered in Epic:   Additional Comments:    Raynaldo Opitz, [place] Social Worker cell #: [phone]

## 2014-08-05 NOTE — Progress Notes (Signed)
[place] for PO levaquin Indication: CAP  No Known Allergies  Patient Measurements: Height: 5' 7.5" (171.5 cm) Weight: 173 lb 1 oz (78.5 kg) IBW/kg (Calculated) : 62.75  Vital Signs: Temp: 97.8 F (36.6 C) (03/14 1300) Temp Source: Oral (03/14 1300) BP: 108/61 mmHg (03/14 1300) Pulse Rate: 79 (03/14 1300) Intake/Output from previous day: 03/13 0701 - 03/14 0700 In: 240 [P.O.:240] Out: 850 [Urine:850] Intake/Output from this shift: Total I/O In: 415 [I.V.:65; IV Piggyback:350] Out: 300 [Urine:300]  Labs:  Recent Labs  08/03/14 0540 08/04/14 0415 08/05/14 0426  WBC 18.5* 16.8* 15.4*  HGB 9.5* 8.4* 8.6*  PLT 173 186 171  CREATININE 0.73 0.69 0.79   Estimated Creatinine Clearance: 68.3 mL/min (by C-G formula based on Cr of 0.79).  Recent Labs  08/04/14 1930  [place] 24.4*     Microbiology: Recent Results (from the past 720 hour(s))  Body fluid culture     Status: None   Collection Time: 07/18/14 10:46 AM  Result Value Ref Range Status   Specimen Description PLEURAL LEFT  Final   Special Requests NONE  Final   Gram Stain   Final    RARE WBC PRESENT,BOTH PMN AND MONONUCLEAR NO ORGANISMS SEEN Performed at [facility]    Culture   Final    NO GROWTH 3 DAYS Performed at [facility]    Report Status 07/21/2014 FINAL  Final  Blood Culture (routine x 2)     Status: None (Preliminary result)   Collection Time: 08/01/14  6:54 PM  Result Value Ref Range Status   Specimen Description RIGHT ANTECUBITAL  Final   Special Requests BOTTLES DRAWN AEROBIC AND ANAEROBIC 5CC  Final   Culture   Final           BLOOD CULTURE RECEIVED NO GROWTH TO DATE CULTURE WILL BE HELD FOR 5 DAYS BEFORE ISSUING A FINAL NEGATIVE REPORT Performed at [facility]    Report Status PENDING  Incomplete  Blood Culture (routine x 2)     Status: None (Preliminary result)   Collection Time: 08/01/14  8:15 PM  Result Value Ref  Range Status   Specimen Description BLOOD RIGHT HAND  Final   Special Requests BOTTLES DRAWN AEROBIC AND ANAEROBIC 4CC  Final   Culture   Final           BLOOD CULTURE RECEIVED NO GROWTH TO DATE CULTURE WILL BE HELD FOR 5 DAYS BEFORE ISSUING A FINAL NEGATIVE REPORT Performed at [facility]    Report Status PENDING  Incomplete  Urine culture     Status: None   Collection Time: 08/03/14 10:26 PM  Result Value Ref Range Status   Specimen Description URINE, CLEAN CATCH  Final   Special Requests NONE  Final   Colony Count NO GROWTH Performed at [facility]   Final   Culture NO GROWTH Performed at [facility]   Final   Report Status 08/05/2014 FINAL  Final  Culture, sputum-assessment     Status: None   Collection Time: 08/05/14 10:25 AM  Result Value Ref Range Status   Specimen Description SPUTUM  Final   Special Requests NONE  Final   Sputum evaluation   Final    THIS SPECIMEN IS ACCEPTABLE. RESPIRATORY CULTURE REPORT TO FOLLOW.   Report Status 08/05/2014 FINAL  Final   Assessment: 50 yoF on broad spectrum antibiotics for sepsis 2/2 PNA, gastroenteritis, and UTI, now improving.  Pharmacy asked  to narrow to PO levaquin.   3/10 >> Zosyn >> 3/10 3/10 >> Vanc >> 3/11 >>Cefepime >> 3/11 >> Tamiflu >> 3/11 3/13 >> Flagyl po for Trich >>  Tmax: AF  WBC: Improving, now 15.4K (steroids) Renal: SCr 0.79, Cl 68 CG/72 N PCT: 30.56 (3/11)  Goal of Therapy:  Eradication of infection  Plan:  Start levaquin PO 500mg  q24h  Ralene Bathe, PharmD, BCPS 08/05/2014, 2:36 PM  Pager: [phone]

## 2014-08-05 NOTE — Progress Notes (Signed)
Patient ID: Michelle Cobb  female  KKX:[id number]    DOB: 08-16-1940    DOA: 08/01/2014  PCP: Woody Seller, MD    Brief history of present illness Michelle Cobb is a 74 y.o. 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 Patient reports she started having worsening cough, runny nose, but no sore throat today. She felt whole-body aching and feels very weak. She coughed up some white colored mucus. No significant chest pain. She also had nausea, vomiting and diarrhea. She reported that she has diarrhea with loose stool and vomiting all day before admission. She did not have significant abdominal pain. Patient had fever and chills. Patient denied dysuria, urgency, frequency, hematuria, skin rashes or leg swelling. No unilateral weakness, numbness or tingling sensations. No vision change or hearing loss. In ED, patient was found to have leukocytosis with WBC 21.8, fever with temperature 101.2, soft blood pressure, tachycardia. X-ray showed stable moderate left pleural effusion and adjacent basilar consolidation/atelectasis. Patient was admitted to inpatient for further evaluation and treatment.    Assessment/Plan: Principal Problem:   Sepsis likely due to pneumonia, gastroenteritis and UTI, : Improving, influenza ruled out -  Pro-calcitonin high 30.56, leukocytosis  improving 15.4 (patient is also on steroids ), narrow antibiotics to Levaquin per pharmacy today  - BP stable, discontinue IV hydrocortisone - UA positive for UTI however urine culture not done at admission, negative   Active Problems: Trichomonas on UA -On Flagyl IV 500 twice a day for 7 days however repeat UA  does not show Trichomonas.  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 s/p SBRT on 05/09/14. Patent has been followed up by Dr. Evelena Leyden. Recently received Nivolumab immunotherapy.     Brain metastasis - Continue Decadron, on stress dose steroids  DVT - Continue home dose of Lovenox  History of seizures Continue Keppra   DVT Prophylaxis:Lovenox  Code Status: DO NOT RESUSCITATE  Family Communication:  no family member at the bedside, patient's neighbor and friend, Jenny Reichmann present at the bedside, patient requested me to disclose her management and plan of care to her friend.  PT evaluation recommending skilled nursing facility, patient agreeable    Disposition: Likely DC in a.m.  Consultants: None   Procedures: None   Antibiotics:  IV vancomycin and discontinue 3/14  IV cefepime and discontinue 3/14  Subjective: Patient seen and examined, feeling a lot better today, sitting up in the chair, talking to her friend. No coughing, fevers or chills overnight, no acute issues  Objective: Weight change:   Intake/Output Summary (Last 24 hours) at 08/05/14 1420 Last data filed at 08/05/14 1400  Gross per 24 hour  Intake    415 ml  Output    850 ml  Net   -435 ml   Blood pressure 108/61, pulse 79, temperature 97.8 F (36.6 C), temperature source Oral, resp. rate 20, height 5' 7.5" (1.715 m), weight 78.5 kg (173 lb 1 oz), SpO2 99 %.  Physical Exam: General: Alert and oriented 3, NAD  CVS: S1-S2 clear, no murmur rubs or gallops Chest: Clear to auscultation bilaterally  Abdomen: soft NT, ND, NBS Extremities:  no cyanosis, clubbing or edema noted bilaterally   Lab Results: Basic Metabolic Panel:  Recent Labs Lab 08/04/14 0415 08/05/14 0426  NA 140 142  K 3.6 3.6  CL 110 112  CO2 21 23  GLUCOSE 118* 122*  BUN  14 16  CREATININE 0.69 0.79  CALCIUM 7.8* 8.3*   Liver Function Tests:  Recent Labs Lab 08/01/14 1854 08/02/14 0135  AST 37 28  ALT 19 16  ALKPHOS 61 49  BILITOT 1.5* 1.0  PROT 7.9 5.7*  ALBUMIN 2.9* 2.1*   No results for input(s): LIPASE, AMYLASE in the last 168 hours. No results for input(s): AMMONIA in the last 168 hours. CBC:  Recent Labs Lab 08/04/14 0415 08/05/14 0426  WBC 16.8* 15.4*  HGB 8.4* 8.6*  HCT 24.9* 25.5*  MCV 74.1* 74.6*  PLT 186 171   Cardiac Enzymes: No results for input(s): CKTOTAL, CKMB, CKMBINDEX, TROPONINI in the last 168 hours. BNP: Invalid input(s): POCBNP CBG:  Recent Labs Lab 08/02/14 0739 08/03/14 0743 08/04/14 0734 08/05/14 0755  GLUCAP 112* 106* 109* 106*     Micro Results: Recent Results (from the past 240 hour(s))  Blood Culture (routine x 2)     Status: None (Preliminary result)   Collection Time: 08/01/14  6:54 PM  Result Value Ref Range Status   Specimen Description RIGHT ANTECUBITAL  Final   Special Requests BOTTLES DRAWN AEROBIC AND ANAEROBIC 5CC  Final   Culture   Final           BLOOD CULTURE RECEIVED NO GROWTH TO DATE CULTURE WILL BE HELD FOR 5 DAYS BEFORE ISSUING A FINAL NEGATIVE REPORT Performed at [facility]    Report Status PENDING  Incomplete  Blood Culture (routine x 2)     Status: None (Preliminary result)   Collection Time: 08/01/14  8:15 PM  Result Value Ref Range Status   Specimen Description BLOOD RIGHT HAND  Final   Special Requests BOTTLES DRAWN AEROBIC AND ANAEROBIC 4CC  Final   Culture   Final           BLOOD CULTURE RECEIVED NO GROWTH TO DATE CULTURE WILL BE HELD FOR 5 DAYS BEFORE ISSUING A FINAL NEGATIVE REPORT Performed at [facility]    Report Status PENDING  Incomplete  Urine culture     Status: None   Collection Time: 08/03/14 10:26 PM  Result Value Ref Range Status   Specimen Description URINE, CLEAN CATCH  Final   Special Requests NONE  Final   Colony Count NO  GROWTH Performed at [facility]   Final   Culture NO GROWTH Performed at [facility]   Final   Report Status 08/05/2014 FINAL  Final  Culture, sputum-assessment     Status: None   Collection Time: 08/05/14 10:25 AM  Result Value Ref Range Status   Specimen Description SPUTUM  Final   Special Requests NONE  Final   Sputum evaluation   Final    THIS SPECIMEN IS ACCEPTABLE. RESPIRATORY CULTURE REPORT TO FOLLOW.   Report Status 08/05/2014 FINAL  Final    Studies/Results: Dg Chest 1 View  07/18/2014   CLINICAL DATA:  Pain following thoracentesis.  Left-sided effusion.  EXAM: CHEST  1 VIEW  COMPARISON:  CT chest with contrast 07/11/2014.  FINDINGS: The heart size is normal. There is a significant decrease in the left pleural effusion. There is no pneumothorax. The right lung is clear. The visualized soft tissues and bony thorax are otherwise unremarkable.  IMPRESSION: 1. Decreased left pleural effusion following thoracentesis. 2. No significant pneumothorax or other complication evident by radiograph. 3. The right lung is clear.   Electronically Signed   By: San Morelle M.D.   On: 07/18/2014 12:07   Dg Chest 2 View  08/01/2014   CLINICAL DATA:  Metastatic lung ca - cough - SOB - Pt resides at home. Pt c/o of SOB N/V. Symptoms started after returning home from here for MRI today  EXAM: CHEST - 2 VIEW  COMPARISON:  07/23/2014  FINDINGS: Moderate left pleural effusion. Consolidation/ atelectasis at the left lung base as before. Right lung clear. Heart size difficult to assess due to adjacent opacity. No pneumothorax Visualized skeletal structures are unremarkable.  IMPRESSION: 1. Stable moderate left pleural effusion and adjacent basilar consolidation/atelectasis.   Electronically Signed   By: Lucrezia Europe M.D.   On: 08/01/2014 20:10   Dg Chest 2 View  07/23/2014   CLINICAL DATA:  Focal left arm seizures; known history of brain cancer and lung cancer. Initial encounter.  EXAM:  CHEST  2 VIEW  COMPARISON:  Chest radiograph performed 07/18/2014  FINDINGS: There is mildly increased moderate left pleural effusion with associated airspace opacity. The right lung appears clear. There is mild rightward mediastinal shift, likely reflecting the pleural effusion. No pneumothorax is seen. The underlying left lower lobe lung mass is not well characterized on radiograph.  The cardiomediastinal silhouette remains borderline normal in size. No acute osseous abnormalities are identified.  IMPRESSION: Mildly increased moderate left pleural effusion, with associated airspace opacity. Underlying known lung mass not well characterized on radiograph.   Electronically Signed   By: Garald Balding M.D.   On: 07/23/2014 18:17   Ct Head Wo Contrast  07/23/2014   CLINICAL DATA:  Focal seizure  EXAM: CT HEAD WITHOUT CONTRAST  TECHNIQUE: Contiguous axial images were obtained from the base of the skull through the vertex without intravenous contrast.  COMPARISON:  04/18/2014  FINDINGS: Skull and Sinuses:Negative for fracture or destructive process. The mastoids, middle ears, and imaged paranasal sinuses are clear.  Orbits: Dysconjugate gaze.  Brain: Known cerebral metastases in the bilateral posterior cerebral hemispheres, parietal on the right and frontal parietal on the left. The masses are cortically based on the right and subcortical on the left. Edema surrounding the left-sided metastasis has increased, but there is no midline shift or herniation, and the metastasis has increased in diameter to 18 mm (13 mm by contrast-enhanced MRI 05/01/2014). The edema tracks into the external and internal capsules posteriorly, making the posterior putamen prominent. Low-attenuation in the bilateral anterior internal capsules is chronic, likely from multiple dilated perivascular spaces and white matter gliosis based on previous brain MRI. There is no evidence of acute hemorrhage. No acute infarct or hydrocephalus.  IMPRESSION:  Known brain metastases. Compared to 05/01/2014 brain MRI, a left posterior frontal metastasis has increased size and vasogenic edema.   Electronically Signed   By: Monte Fantasia M.D.   On: 07/23/2014 21:09   Ct Chest W Contrast  07/11/2014   CLINICAL DATA:  Stage IV left lung cancer, after chemotherapy  EXAM: CT CHEST, ABDOMEN, AND PELVIS WITH CONTRAST  TECHNIQUE: Multidetector CT imaging of the chest, abdomen and pelvis was performed following the standard protocol during bolus administration of intravenous contrast.  CONTRAST:  154m OMNIPAQUE IOHEXOL 300 MG/ML  SOLN  COMPARISON:  None.  FINDINGS: CT CHEST FINDINGS  Mediastinum/Nodes: Heart is normal in size. No pericardial effusion.  Coronary atherosclerosis in the  LAD.  Atherosclerotic calcifications of the aortic arch. Mild ectasia of the ascending thoracic aortic, measuring 3.4 cm.  Small mediastinal nodes, including a 5 mm AP window node in a 6 mm short axis prevascular node. 2.2 x 2.5 cm left hilar node (series 2/image 35), previously 1.8 x 2.6 cm.  Visualized thyroid is unremarkable.  Lungs/Pleura: 5.6 x 4.4 cm area of mass-like consolidation in the left lower lobe (series 2/image 46), corresponding to a prior 8.0 x 4.7 cm mass.  Moderate to large left pleural effusion, partially loculated, with associated pleural-based nodularity (series 2/image 50).  Visualized right lung is clear.  Moderate centrilobular and paraseptal emphysematous changes.  No pneumothorax.  Musculoskeletal: Mild degenerative changes of the thoracic spine.  CT ABDOMEN PELVIS FINDINGS  Hepatobiliary: Possible 8 x 11 mm hypoenhancing lesion in the central right liver anterior to the intrahepatic IVC (series 2/image 54).  2.2 x 1.2 cm hypoenhancing metastasis posteriorly in the lateral segment left hepatic lobe (series 2/image 60), previously 7 mm.  Gallbladder is notable for multiple layering gallstones, without associated inflammatory changes. No intrahepatic or extrahepatic ductal  dilatation.  Pancreas: Within normal limits.  Spleen: Within normal limits.  Adrenals/Urinary Tract: Adrenal glands unremarkable.  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  Right kidney is within normal limits.  No hydronephrosis.  Bladder is within normal limits.  Stomach/Bowel: Stomach is unremarkable.  No evidence of bowel obstruction.  Normal appendix.  Vascular/Lymphatic: Atherosclerotic calcifications of the abdominal aorta and branch vessels.  Fusiform ectasia of the infrarenal abdominal aorta, measuring 2.6 x 2.7 cm (series 2/image 37).  No suspicious abdominopelvic lymphadenopathy.  Reproductive: Status post hysterectomy.  No adnexal masses.  Other: No abdominopelvic ascites.  Musculoskeletal: 8 mm sclerotic lesion in the superior endplate of L4, new, suspicious (sagittal image 71). Additional sclerotic lesion in the sacrum (sagittal image 70), grossly unchanged.  IMPRESSION: Mixed response.  Left lower lobe mass has decreased in size.  Associated mediastinal/left hilar lymphadenopathy is mildly decreased. Moderate to large left pleural effusion, likely malignant, new/increased.  Suspected mild progression of hepatic metastases.  New 8 mm sclerotic lesion at L4, suspicious for osseous metastasis.  Additional ancillary findings as above.   Electronically Signed   By: Julian Hy M.D.   On: 07/11/2014 11:10   Mr Jeri Cos AJ Contrast  07/29/2014   CLINICAL DATA:  Metastatic lung cancer. Three month stereotactic radiosurgery follow-up  EXAM: MRI HEAD WITHOUT AND WITH CONTRAST  TECHNIQUE: Multiplanar, multiecho pulse sequences of the brain and surrounding structures were obtained without and with intravenous contrast.  CONTRAST:  16 mL MultiHance IV  COMPARISON:  MRI 05/01/2014  FINDINGS: Multiple metastatic deposits. Increase in edema in the left parietal lobe secondary to enlarging hemorrhagic metastatic deposit. Right parietal edema also has progressed somewhat.  Ventricle size remains normal. No significant shift of the midline structures. No acute infarct.  Punctate calcification right cerebellum unchanged without enhancement.  3 mm enhancing nodule right superior cerebellum unchanged consistent with metastatic deposit.  2 mm enhancing nodule central pons unchanged.  19 x 20 mm hemorrhagic lesion left parietal lobe has increased in size since the prior study. Increased surrounding white matter edema.  11 x 13 mm hemorrhagic enhancing lesion high right parietal lobe is similar in size. Slight increase in surrounding edema.  New enhancing lesion left frontal lobe over the convexity measuring 4 x 6.6 mm. This is consistent with metastatic disease and is new.  Calvarium is negative.  Paranasal sinuses clear  IMPRESSION: Multiple metastatic deposits are present as described above.  Hemorrhagic lesion left parietal lobe has increased in size with increase in surrounding edema.  New lesion over the left frontal convexity measuring 4 x 6.6 mm.  Right high parietal lesion is similar in size but slightly increased edema since the prior study. Other lesions are stable.   Electronically Signed   By: Franchot Gallo M.D.   On: 07/29/2014 15:06   Ct Abdomen Pelvis W Contrast  07/11/2014   CLINICAL DATA:  Stage IV left lung cancer, after chemotherapy  EXAM: CT CHEST, ABDOMEN, AND PELVIS WITH CONTRAST  TECHNIQUE: Multidetector CT imaging of the chest, abdomen and pelvis was performed following the standard protocol during bolus administration of intravenous contrast.  CONTRAST:  12m OMNIPAQUE IOHEXOL 300 MG/ML  SOLN  COMPARISON:  None.  FINDINGS: CT CHEST FINDINGS  Mediastinum/Nodes: Heart is normal in size. No pericardial effusion.  Coronary atherosclerosis in the LAD.  Atherosclerotic calcifications of the aortic arch. Mild ectasia of the ascending thoracic aortic, measuring 3.4 cm.  Small mediastinal nodes, including a 5 mm AP window node in a 6 mm short axis prevascular node. 2.2  x 2.5 cm left hilar node (series 2/image 35), previously 1.8 x 2.6 cm.  Visualized thyroid is unremarkable.  Lungs/Pleura: 5.6 x 4.4 cm area of mass-like consolidation in the left lower lobe (series 2/image 46), corresponding to a prior 8.0 x 4.7 cm mass.  Moderate to large left pleural effusion, partially loculated, with associated pleural-based nodularity (series 2/image 50).  Visualized right lung is clear.  Moderate centrilobular and paraseptal emphysematous changes.  No pneumothorax.  Musculoskeletal: Mild degenerative changes of the thoracic spine.  CT ABDOMEN PELVIS FINDINGS  Hepatobiliary: Possible 8 x 11 mm hypoenhancing lesion in the central right liver anterior to the intrahepatic IVC (series 2/image 54).  2.2 x 1.2 cm hypoenhancing metastasis posteriorly in the lateral segment left hepatic lobe (series 2/image 60), previously 7 mm.  Gallbladder is notable for multiple layering gallstones, without associated inflammatory changes. No intrahepatic or extrahepatic ductal dilatation.  Pancreas: Within normal limits.  Spleen: Within normal limits.  Adrenals/Urinary Tract: Adrenal glands unremarkable.  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  Right kidney is within normal limits.  No hydronephrosis.  Bladder is within normal limits.  Stomach/Bowel: Stomach is unremarkable.  No evidence of bowel obstruction.  Normal appendix.  Vascular/Lymphatic: Atherosclerotic calcifications of the abdominal aorta and branch vessels.  Fusiform ectasia of the infrarenal abdominal aorta, measuring 2.6 x 2.7 cm (series 2/image 37).  No suspicious abdominopelvic lymphadenopathy.  Reproductive: Status post hysterectomy.  No adnexal masses.  Other: No abdominopelvic ascites.  Musculoskeletal: 8 mm sclerotic lesion in the superior endplate of L4, new, suspicious (sagittal image 71). Additional sclerotic lesion in the sacrum (sagittal image 70), grossly unchanged.  IMPRESSION: Mixed response.   Left lower lobe mass has decreased in size.  Associated mediastinal/left hilar lymphadenopathy is mildly decreased. Moderate to large left pleural effusion, likely malignant, new/increased.  Suspected mild progression of hepatic metastases.  New 8 mm sclerotic lesion at L4, suspicious for osseous metastasis.  Additional ancillary findings as above.   Electronically Signed   By: SJulian HyM.D.   On: 07/11/2014 11:10   [place]oracentesis Asp Pleural Space W/img Guide  07/18/2014   CLINICAL DATA:  Shortness of breath. Left-sided pleural effusion. Request diagnostic and therapeutic thoracentesis.  EXAM: ULTRASOUND GUIDED LEFT THORACENTESIS  COMPARISON:  None.  PROCEDURE: An ultrasound guided thoracentesis was thoroughly discussed with the patient and questions answered. The benefits, risks, alternatives and complications were also discussed. The patient understands and wishes to proceed with the procedure. Written consent was obtained.  Ultrasound was performed to localize and mark an adequate pocket of fluid in the left chest. The area was then prepped and draped in the normal sterile fashion. 1% Lidocaine was used for local anesthesia. Under ultrasound guidance a 19 gauge Yueh catheter was introduced. Thoracentesis was performed. The catheter was removed and a dressing applied.  COMPLICATIONS: None  FINDINGS: A total of approximately 750 mL of clear, dark amber colored fluid was removed. A fluid sample wassent for laboratory analysis.  IMPRESSION: Successful ultrasound guided left thoracentesis yielding 750 mL of pleural fluid.  Read by: Ascencion Dike PA-C   Electronically Signed   By: Corrie Mckusick D.O.   On: 07/18/2014 11:27    Medications: Scheduled Meds: . atorvastatin  10 mg Oral QHS  . benzonatate  100 mg Oral TID  . cholecalciferol  1,000 Units Oral Daily  . dexamethasone  4 mg Oral TID  . dronabinol  2.5 mg Oral BID AC  . enoxaparin (LOVENOX) injection  1 mg/kg Subcutaneous Q12H  . folic  acid  1 mg Oral Daily  . levETIRAcetam  1,000 mg Oral BID  . metroNIDAZOLE  500 mg Oral Q12H  . mirtazapine  15 mg Oral QHS  . potassium chloride  40 mEq Oral Once  . sodium chloride  3 mL Intravenous Q12H  . sodium chloride  3 mL Intravenous Q12H   Time spent 25 minutes   LOS: 4 days   Tammala Weider M.D. [facility] 08/05/2014, 2:20 PM Pager: [phone]  If 7PM-7AM, please contact night-coverage [email or web address] Password [email or web address]

## 2014-08-06 LAB — CBC
HCT: 29.9 % — ABNORMAL LOW (ref 36.0–46.0)
Hemoglobin: 10.1 g/dL — ABNORMAL LOW (ref 12.0–15.0)
MCH: 25 pg — ABNORMAL LOW (ref 26.0–34.0)
MCHC: 33.8 g/dL (ref 30.0–36.0)
MCV: 74 fL — AB (ref 78.0–100.0)
PLATELETS: 201 10*3/uL (ref 150–400)
RBC: 4.04 MIL/uL (ref 3.87–5.11)
RDW: 18.5 % — ABNORMAL HIGH (ref 11.5–15.5)
WBC: 14.6 10*3/uL — AB (ref 4.0–10.5)

## 2014-08-06 LAB — BASIC METABOLIC PANEL
Anion gap: 7 (ref 5–15)
BUN: 19 mg/dL (ref 6–23)
CALCIUM: 8.3 mg/dL — AB (ref 8.4–10.5)
CHLORIDE: 110 mmol/L (ref 96–112)
CO2: 23 mmol/L (ref 19–32)
Creatinine, Ser: 0.79 mg/dL (ref 0.50–1.10)
GFR calc non Af Amer: 81 mL/min — ABNORMAL LOW (ref >90)
GLUCOSE: 130 mg/dL — AB (ref 70–99)
Potassium: 3.2 mmol/L — ABNORMAL LOW (ref 3.5–5.1)
SODIUM: 140 mmol/L (ref 135–145)

## 2014-08-06 LAB — GLUCOSE, CAPILLARY: GLUCOSE-CAPILLARY: 104 mg/dL — AB (ref 70–99)

## 2014-08-06 MED ORDER — FUROSEMIDE 10 MG/ML IJ SOLN
20.0000 mg | Freq: Once | INTRAMUSCULAR | Status: AC
  Administered 2014-08-06: 20 mg via INTRAVENOUS
  Filled 2014-08-06: qty 2

## 2014-08-06 MED ORDER — ONDANSETRON HCL 4 MG/2ML IJ SOLN: 4.0000 mg | Freq: Four times a day (QID) | INTRAMUSCULAR | Status: DC | PRN

## 2014-08-06 MED ORDER — POTASSIUM CHLORIDE CRYS ER 20 MEQ PO TBCR
40.0000 meq | EXTENDED_RELEASE_TABLET | Freq: Once | ORAL | Status: AC
  Administered 2014-08-06: 40 meq via ORAL
  Filled 2014-08-06: qty 2

## 2014-08-06 NOTE — Progress Notes (Signed)
Physical Therapy Treatment Patient Details Name: Michelle Cobb MRN: [id number] DOB: 1940/12/17 Today's Date: 08/06/2014    History of Present Illness 74 y.o. 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 Very deconditioned-barely able to tolerate minimal activity during session. Continue to recommend SNF.   Follow Up Recommendations  SNF     Equipment Recommendations  Rolling walker with 5" wheels    Recommendations for Other Services       Precautions / Restrictions Precautions Precautions: Fall Restrictions Weight Bearing Restrictions: No    Mobility  Bed Mobility Overal bed mobility: Needs Assistance Bed Mobility: Supine to Sit     Supine to sit: HOB elevated;Mod assist     General bed mobility comments: Assist for LEs and trunk. Increased time. Utilized bedpad for scooting, positioning  Transfers Overall transfer level: Needs assistance Equipment used: Rolling walker (2 wheeled) Transfers: Sit to/from Stand Sit to Stand: Mod assist         General transfer comment: Assist to rise, stabilize, control descent. Multimodal cues for safety, technique, hand placement. Stand pivot from bed to [facility] with RW.   Ambulation/Gait Ambulation/Gait assistance: Min assist Ambulation Distance (Feet): 3 Feet Assistive device: Rolling walker (2 wheeled) Gait Pattern/deviations: Trunk flexed;Decreased stride length;Step-to pattern     General Gait Details: Pt able to take a couple of steps from bsc to recliner with RW. Increased time. Assist to stabilize   Stairs            Wheelchair Mobility    Modified  Rankin (Stroke Patients Only)       Balance                                    Cognition Arousal/Alertness: Awake/alert Behavior During Therapy: WFL for tasks assessed/performed Overall Cognitive Status: Within Functional Limits for tasks assessed                      Exercises      General Comments        Pertinent Vitals/Pain Pain Assessment: Faces Faces Pain Scale: Hurts little more Pain Location: bil LEs Pain Descriptors / Indicators: Sore;Aching Pain Intervention(s): Monitored during session;Limited activity within patient's tolerance    Home Living                      Prior Function            PT Goals (current goals can now be found in the care plan section) Progress towards PT goals: Progressing toward goals (slowly)    Frequency  Min 3X/week    PT Plan Current plan remains appropriate    Co-evaluation             End of Session Equipment Utilized During Treatment: Gait belt Activity Tolerance: Patient limited by fatigue Patient left: in chair;with call bell/phone within reach;with chair alarm set     Time: 6734-1937 PT Time Calculation (min) (ACUTE ONLY): 21 min  Charges:  $Therapeutic Activity: 8-22 mins  G Codes:      Weston Anna, MPT Pager: [phone]

## 2014-08-06 NOTE — Progress Notes (Signed)
Patient ID: Michelle Cobb  female  ZWC:[id number]    DOB: 17-Nov-1940    DOA: 08/01/2014  PCP: Woody Seller, MD    Brief history of present illness Michelle Cobb is a 74 y.o. 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 Patient reports she started having worsening cough, runny nose, but no sore throat today. She felt whole-body aching and feels very weak. She coughed up some white colored mucus. No significant chest pain. She also had nausea, vomiting and diarrhea. She reported that she has diarrhea with loose stool and vomiting all day before admission. She did not have significant abdominal pain. Patient had fever and chills. Patient denied dysuria, urgency, frequency, hematuria, skin rashes or leg swelling. No unilateral weakness, numbness or tingling sensations. No vision change or hearing loss. In ED, patient was found to have leukocytosis with WBC 21.8, fever with temperature 101.2, soft blood pressure, tachycardia. X-ray showed stable moderate left pleural effusion and adjacent basilar consolidation/atelectasis. Patient was admitted to inpatient for further evaluation and treatment.    Assessment/Plan: Principal Problem:   Sepsis likely due to pneumonia, gastroenteritis and UTI, : Improving, influenza ruled out -  Pro-calcitonin high 30.56, leukocytosis  improving 15.4 (patient is also on steroids ), narrowed antibiotics to Levaquin - BP stable, discontinueD IV hydrocortisone - UA positive for UTI however urine culture not done at admission,now negative  - feels her legs are swollen, likely due to IV fluids,I's and O's +6.0 L, gave Lasix 20 mg IV 1  Active  Problems: Trichomonas on UA -On Flagyl IV 500 twice a day for 7 days however repeat UA does not show Trichomonas.  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 s/p SBRT on 05/09/14. Patent has been followed up by Dr. Evelena Leyden. Recently received Nivolumab immunotherapy.     Brain metastasis - Continue Decadron, on stress dose steroids  DVT - Continue home dose of Lovenox  Hypokalemia Replaced  History of seizures Continue Keppra   DVT Prophylaxis:Lovenox  Code Status: DO NOT RESUSCITATE  Family Communication:  no family member at the bedside, patient's neighbor and friend, Jenny Reichmann present at the bedside yesterday, patient requested me to disclose her management and plan of care to her friend.  PT evaluation recommending skilled nursing facility, patient agreeable. Hopefully DC home in a.m. If medically stable    Disposition: Likely DC in a.m.  Consultants: None   Procedures: None   Antibiotics:  IV vancomycin and discontinue 3/14  IV cefepime and discontinue 3/14  Subjective: Patient seen and examined,feeling weak, nauseous today, complaining of edema in her feet  Objective: Weight change:   Intake/Output Summary (Last 24 hours) at 08/06/14 1240 Last data filed at 08/06/14 0845  Gross per 24 hour  Intake    615 ml  Output    550 ml  Net     65 ml   Blood pressure 110/65, pulse 86, temperature 98.4 F (36.9 C), temperature source Oral, resp. rate 18, height 5' 7.5" (1.715 m), weight 78.5 kg (173 lb 1 oz), SpO2 98 %.  Physical Exam: General: Alert and oriented 3, NAD  CVS:  S1-S2 clear, no murmur rubs or gallops Chest: Clear to auscultation bilaterally  Abdomen: soft NT, ND, NBS Extremities: no cyanosis, clubbing. Feet look swollen   Lab Results: Basic Metabolic Panel:  Recent Labs Lab  08/05/14 0426 08/06/14 0850  NA 142 140  K 3.6 3.2*  CL 112 110  CO2 23 23  GLUCOSE 122* 130*  BUN 16 19  CREATININE 0.79 0.79  CALCIUM 8.3* 8.3*   Liver Function Tests:  Recent Labs Lab 08/01/14 1854 08/02/14 0135  AST 37 28  ALT 19 16  ALKPHOS 61 49  BILITOT 1.5* 1.0  PROT 7.9 5.7*  ALBUMIN 2.9* 2.1*   No results for input(s): LIPASE, AMYLASE in the last 168 hours. No results for input(s): AMMONIA in the last 168 hours. CBC:  Recent Labs Lab 08/05/14 0426 08/06/14 0850  WBC 15.4* 14.6*  HGB 8.6* 10.1*  HCT 25.5* 29.9*  MCV 74.6* 74.0*  PLT 171 201   Cardiac Enzymes: No results for input(s): CKTOTAL, CKMB, CKMBINDEX, TROPONINI in the last 168 hours. BNP: Invalid input(s): POCBNP CBG:  Recent Labs Lab 08/02/14 0739 08/03/14 0743 08/04/14 0734 08/05/14 0755 08/06/14 0746  GLUCAP 112* 106* 109* 106* 104*     Micro Results: Recent Results (from the past 240 hour(s))  Blood Culture (routine x 2)     Status: None (Preliminary result)   Collection Time: 08/01/14  6:54 PM  Result Value Ref Range Status   Specimen Description RIGHT ANTECUBITAL  Final   Special Requests BOTTLES DRAWN AEROBIC AND ANAEROBIC 5CC  Final   Culture   Final           BLOOD CULTURE RECEIVED NO GROWTH TO DATE CULTURE WILL BE HELD FOR 5 DAYS BEFORE ISSUING A FINAL NEGATIVE REPORT Performed at [facility]    Report Status PENDING  Incomplete  Blood Culture (routine x 2)     Status: None (Preliminary result)   Collection Time: 08/01/14  8:15 PM  Result Value Ref Range Status   Specimen Description BLOOD RIGHT HAND  Final   Special Requests BOTTLES DRAWN AEROBIC AND ANAEROBIC 4CC  Final   Culture   Final           BLOOD CULTURE RECEIVED NO GROWTH TO DATE CULTURE WILL BE HELD FOR 5 DAYS BEFORE ISSUING A FINAL NEGATIVE REPORT Performed at [facility]    Report Status PENDING  Incomplete  Urine culture     Status: None   Collection Time: 08/03/14 10:26 PM   Result Value Ref Range Status   Specimen Description URINE, CLEAN CATCH  Final   Special Requests NONE  Final   Colony Count NO GROWTH Performed at [facility]   Final   Culture NO GROWTH Performed at [facility]   Final   Report Status 08/05/2014 FINAL  Final  Culture, sputum-assessment     Status: None   Collection Time: 08/05/14 10:25 AM  Result Value Ref Range Status   Specimen Description SPUTUM  Final   Special Requests NONE  Final   Sputum evaluation   Final    THIS SPECIMEN IS ACCEPTABLE. RESPIRATORY CULTURE REPORT TO FOLLOW.   Report Status 08/05/2014 FINAL  Final  Culture, respiratory (NON-Expectorated)     Status: None (Preliminary result)   Collection Time: 08/05/14 10:25 AM  Result Value Ref Range Status   Specimen Description SPUTUM  Final   Special Requests NONE  Final   Gram Stain   Final    ABUNDANT  WBC PRESENT, PREDOMINANTLY PMN FEW SQUAMOUS EPITHELIAL CELLS PRESENT FEW GRAM POSITIVE COCCI IN PAIRS Performed at [facility]    Culture   Final    Culture reincubated for better growth Performed at [facility]    Report Status PENDING  Incomplete    Studies/Results: Dg Chest 1 View  07/18/2014   CLINICAL DATA:  Pain following thoracentesis.  Left-sided effusion.  EXAM: CHEST  1 VIEW  COMPARISON:  CT chest with contrast 07/11/2014.  FINDINGS: The heart size is normal. There is a significant decrease in the left pleural effusion. There is no pneumothorax. The right lung is clear. The visualized soft tissues and bony thorax are otherwise unremarkable.  IMPRESSION: 1. Decreased left pleural effusion following thoracentesis. 2. No significant pneumothorax or other complication evident by radiograph. 3. The right lung is clear.   Electronically Signed   By: San Morelle M.D.   On: 07/18/2014 12:07   Dg Chest 2 View  08/01/2014   CLINICAL DATA:  Metastatic lung ca - cough - SOB - Pt resides at home. Pt c/o of SOB N/V.  Symptoms started after returning home from here for MRI today  EXAM: CHEST - 2 VIEW  COMPARISON:  07/23/2014  FINDINGS: Moderate left pleural effusion. Consolidation/ atelectasis at the left lung base as before. Right lung clear. Heart size difficult to assess due to adjacent opacity. No pneumothorax Visualized skeletal structures are unremarkable.  IMPRESSION: 1. Stable moderate left pleural effusion and adjacent basilar consolidation/atelectasis.   Electronically Signed   By: Lucrezia Europe M.D.   On: 08/01/2014 20:10   Dg Chest 2 View  07/23/2014   CLINICAL DATA:  Focal left arm seizures; known history of brain cancer and lung cancer. Initial encounter.  EXAM: CHEST  2 VIEW  COMPARISON:  Chest radiograph performed 07/18/2014  FINDINGS: There is mildly increased moderate left pleural effusion with associated airspace opacity. The right lung appears clear. There is mild rightward mediastinal shift, likely reflecting the pleural effusion. No pneumothorax is seen. The underlying left lower lobe lung mass is not well characterized on radiograph.  The cardiomediastinal silhouette remains borderline normal in size. No acute osseous abnormalities are identified.  IMPRESSION: Mildly increased moderate left pleural effusion, with associated airspace opacity. Underlying known lung mass not well characterized on radiograph.   Electronically Signed   By: Garald Balding M.D.   On: 07/23/2014 18:17   Ct Head Wo Contrast  07/23/2014   CLINICAL DATA:  Focal seizure  EXAM: CT HEAD WITHOUT CONTRAST  TECHNIQUE: Contiguous axial images were obtained from the base of the skull through the vertex without intravenous contrast.  COMPARISON:  04/18/2014  FINDINGS: Skull and Sinuses:Negative for fracture or destructive process. The mastoids, middle ears, and imaged paranasal sinuses are clear.  Orbits: Dysconjugate gaze.  Brain: Known cerebral metastases in the bilateral posterior cerebral hemispheres, parietal on the right and frontal  parietal on the left. The masses are cortically based on the right and subcortical on the left. Edema surrounding the left-sided metastasis has increased, but there is no midline shift or herniation, and the metastasis has increased in diameter to 18 mm (13 mm by contrast-enhanced MRI 05/01/2014). The edema tracks into the external and internal capsules posteriorly, making the posterior putamen prominent. Low-attenuation in the bilateral anterior internal capsules is chronic, likely from multiple dilated perivascular spaces and white matter gliosis based on previous brain MRI. There is no evidence of acute hemorrhage. No acute infarct or hydrocephalus.  IMPRESSION: Known  brain metastases. Compared to 05/01/2014 brain MRI, a left posterior frontal metastasis has increased size and vasogenic edema.   Electronically Signed   By: Monte Fantasia M.D.   On: 07/23/2014 21:09   Ct Chest W Contrast  07/11/2014   CLINICAL DATA:  Stage IV left lung cancer, after chemotherapy  EXAM: CT CHEST, ABDOMEN, AND PELVIS WITH CONTRAST  TECHNIQUE: Multidetector CT imaging of the chest, abdomen and pelvis was performed following the standard protocol during bolus administration of intravenous contrast.  CONTRAST:  157m OMNIPAQUE IOHEXOL 300 MG/ML  SOLN  COMPARISON:  None.  FINDINGS: CT CHEST FINDINGS  Mediastinum/Nodes: Heart is normal in size. No pericardial effusion.  Coronary atherosclerosis in the LAD.  Atherosclerotic calcifications of the aortic arch. Mild ectasia of the ascending thoracic aortic, measuring 3.4 cm.  Small mediastinal nodes, including a 5 mm AP window node in a 6 mm short axis prevascular node. 2.2 x 2.5 cm left hilar node (series 2/image 35), previously 1.8 x 2.6 cm.  Visualized thyroid is unremarkable.  Lungs/Pleura: 5.6 x 4.4 cm area of mass-like consolidation in the left lower lobe (series 2/image 46), corresponding to a prior 8.0 x 4.7 cm mass.  Moderate to large left pleural effusion, partially loculated,  with associated pleural-based nodularity (series 2/image 50).  Visualized right lung is clear.  Moderate centrilobular and paraseptal emphysematous changes.  No pneumothorax.  Musculoskeletal: Mild degenerative changes of the thoracic spine.  CT ABDOMEN PELVIS FINDINGS  Hepatobiliary: Possible 8 x 11 mm hypoenhancing lesion in the central right liver anterior to the intrahepatic IVC (series 2/image 54).  2.2 x 1.2 cm hypoenhancing metastasis posteriorly in the lateral segment left hepatic lobe (series 2/image 60), previously 7 mm.  Gallbladder is notable for multiple layering gallstones, without associated inflammatory changes. No intrahepatic or extrahepatic ductal dilatation.  Pancreas: Within normal limits.  Spleen: Within normal limits.  Adrenals/Urinary Tract: Adrenal glands unremarkable.  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  Right kidney is within normal limits.  No hydronephrosis.  Bladder is within normal limits.  Stomach/Bowel: Stomach is unremarkable.  No evidence of bowel obstruction.  Normal appendix.  Vascular/Lymphatic: Atherosclerotic calcifications of the abdominal aorta and branch vessels.  Fusiform ectasia of the infrarenal abdominal aorta, measuring 2.6 x 2.7 cm (series 2/image 37).  No suspicious abdominopelvic lymphadenopathy.  Reproductive: Status post hysterectomy.  No adnexal masses.  Other: No abdominopelvic ascites.  Musculoskeletal: 8 mm sclerotic lesion in the superior endplate of L4, new, suspicious (sagittal image 71). Additional sclerotic lesion in the sacrum (sagittal image 70), grossly unchanged.  IMPRESSION: Mixed response.  Left lower lobe mass has decreased in size.  Associated mediastinal/left hilar lymphadenopathy is mildly decreased. Moderate to large left pleural effusion, likely malignant, new/increased.  Suspected mild progression of hepatic metastases.  New 8 mm sclerotic lesion at L4, suspicious for osseous metastasis.  Additional  ancillary findings as above.   Electronically Signed   By: SJulian HyM.D.   On: 07/11/2014 11:10   Mr BJeri CosWKVContrast  07/29/2014   CLINICAL DATA:  Metastatic lung cancer. Three month stereotactic radiosurgery follow-up  EXAM: MRI HEAD WITHOUT AND WITH CONTRAST  TECHNIQUE: Multiplanar, multiecho pulse sequences of the brain and surrounding structures were obtained without and with intravenous contrast.  CONTRAST:  16 mL MultiHance IV  COMPARISON:  MRI 05/01/2014  FINDINGS: Multiple metastatic deposits. Increase in edema in the left parietal lobe secondary to enlarging hemorrhagic metastatic deposit. Right parietal edema  also has progressed somewhat. Ventricle size remains normal. No significant shift of the midline structures. No acute infarct.  Punctate calcification right cerebellum unchanged without enhancement.  3 mm enhancing nodule right superior cerebellum unchanged consistent with metastatic deposit.  2 mm enhancing nodule central pons unchanged.  19 x 20 mm hemorrhagic lesion left parietal lobe has increased in size since the prior study. Increased surrounding white matter edema.  11 x 13 mm hemorrhagic enhancing lesion high right parietal lobe is similar in size. Slight increase in surrounding edema.  New enhancing lesion left frontal lobe over the convexity measuring 4 x 6.6 mm. This is consistent with metastatic disease and is new.  Calvarium is negative.  Paranasal sinuses clear  IMPRESSION: Multiple metastatic deposits are present as described above.  Hemorrhagic lesion left parietal lobe has increased in size with increase in surrounding edema.  New lesion over the left frontal convexity measuring 4 x 6.6 mm.  Right high parietal lesion is similar in size but slightly increased edema since the prior study. Other lesions are stable.   Electronically Signed   By: Franchot Gallo M.D.   On: 07/29/2014 15:06   Ct Abdomen Pelvis W Contrast  07/11/2014   CLINICAL DATA:  Stage IV left lung  cancer, after chemotherapy  EXAM: CT CHEST, ABDOMEN, AND PELVIS WITH CONTRAST  TECHNIQUE: Multidetector CT imaging of the chest, abdomen and pelvis was performed following the standard protocol during bolus administration of intravenous contrast.  CONTRAST:  133m OMNIPAQUE IOHEXOL 300 MG/ML  SOLN  COMPARISON:  None.  FINDINGS: CT CHEST FINDINGS  Mediastinum/Nodes: Heart is normal in size. No pericardial effusion.  Coronary atherosclerosis in the LAD.  Atherosclerotic calcifications of the aortic arch. Mild ectasia of the ascending thoracic aortic, measuring 3.4 cm.  Small mediastinal nodes, including a 5 mm AP window node in a 6 mm short axis prevascular node. 2.2 x 2.5 cm left hilar node (series 2/image 35), previously 1.8 x 2.6 cm.  Visualized thyroid is unremarkable.  Lungs/Pleura: 5.6 x 4.4 cm area of mass-like consolidation in the left lower lobe (series 2/image 46), corresponding to a prior 8.0 x 4.7 cm mass.  Moderate to large left pleural effusion, partially loculated, with associated pleural-based nodularity (series 2/image 50).  Visualized right lung is clear.  Moderate centrilobular and paraseptal emphysematous changes.  No pneumothorax.  Musculoskeletal: Mild degenerative changes of the thoracic spine.  CT ABDOMEN PELVIS FINDINGS  Hepatobiliary: Possible 8 x 11 mm hypoenhancing lesion in the central right liver anterior to the intrahepatic IVC (series 2/image 54).  2.2 x 1.2 cm hypoenhancing metastasis posteriorly in the lateral segment left hepatic lobe (series 2/image 60), previously 7 mm.  Gallbladder is notable for multiple layering gallstones, without associated inflammatory changes. No intrahepatic or extrahepatic ductal dilatation.  Pancreas: Within normal limits.  Spleen: Within normal limits.  Adrenals/Urinary Tract: Adrenal glands unremarkable.  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  Right kidney is within normal limits.  No hydronephrosis.  Bladder is  within normal limits.  Stomach/Bowel: Stomach is unremarkable.  No evidence of bowel obstruction.  Normal appendix.  Vascular/Lymphatic: Atherosclerotic calcifications of the abdominal aorta and branch vessels.  Fusiform ectasia of the infrarenal abdominal aorta, measuring 2.6 x 2.7 cm (series 2/image 37).  No suspicious abdominopelvic lymphadenopathy.  Reproductive: Status post hysterectomy.  No adnexal masses.  Other: No abdominopelvic ascites.  Musculoskeletal: 8 mm sclerotic lesion in the superior endplate of L4, new, suspicious (sagittal image 71).  Additional sclerotic lesion in the sacrum (sagittal image 70), grossly unchanged.  IMPRESSION: Mixed response.  Left lower lobe mass has decreased in size.  Associated mediastinal/left hilar lymphadenopathy is mildly decreased. Moderate to large left pleural effusion, likely malignant, new/increased.  Suspected mild progression of hepatic metastases.  New 8 mm sclerotic lesion at L4, suspicious for osseous metastasis.  Additional ancillary findings as above.   Electronically Signed   By: Julian Hy M.D.   On: 07/11/2014 11:10   US Thoracentesis Asp Pleural Space W/img Guide  07/18/2014   CLINICAL DATA:  Shortness of breath. Left-sided pleural effusion. Request diagnostic and therapeutic thoracentesis.  EXAM: ULTRASOUND GUIDED LEFT THORACENTESIS  COMPARISON:  None.  PROCEDURE: An ultrasound guided thoracentesis was thoroughly discussed with the patient and questions answered. The benefits, risks, alternatives and complications were also discussed. The patient understands and wishes to proceed with the procedure. Written consent was obtained.  Ultrasound was performed to localize and mark an adequate pocket of fluid in the left chest. The area was then prepped and draped in the normal sterile fashion. 1% Lidocaine was used for local anesthesia. Under ultrasound guidance a 19 gauge Yueh catheter was introduced. Thoracentesis was performed. The catheter was  removed and a dressing applied.  COMPLICATIONS: None  FINDINGS: A total of approximately 750 mL of clear, dark amber colored fluid was removed. A fluid sample wassent for laboratory analysis.  IMPRESSION: Successful ultrasound guided left thoracentesis yielding 750 mL of pleural fluid.  Read by: Ascencion Dike PA-C   Electronically Signed   By: Corrie Mckusick D.O.   On: 07/18/2014 11:27    Medications: Scheduled Meds: . atorvastatin  10 mg Oral QHS  . benzonatate  100 mg Oral TID  . cholecalciferol  1,000 Units Oral Daily  . dexamethasone  4 mg Oral TID  . dronabinol  2.5 mg Oral BID AC  . enoxaparin (LOVENOX) injection  1 mg/kg Subcutaneous Q12H  . folic acid  1 mg Oral Daily  . levETIRAcetam  1,000 mg Oral BID  . levofloxacin  500 mg Oral Q1400  . metroNIDAZOLE  500 mg Oral Q12H  . mirtazapine  15 mg Oral QHS  . potassium chloride  40 mEq Oral Once  . sodium chloride  3 mL Intravenous Q12H  . sodium chloride  3 mL Intravenous Q12H   Time spent 25 minutes   LOS: 5 days   RAI,RIPUDEEP M.D. [facility] 08/06/2014, 12:40 PM Pager: [phone]  If 7PM-7AM, please contact night-coverage [email or web address] Password [email or web address]

## 2014-08-07 ENCOUNTER — Telehealth: Payer: Self-pay | Admitting: Radiation Oncology

## 2014-08-07 LAB — GLUCOSE, CAPILLARY: Glucose-Capillary: 151 mg/dL — ABNORMAL HIGH (ref 70–99)

## 2014-08-07 LAB — CULTURE, RESPIRATORY: CULTURE: NORMAL

## 2014-08-07 LAB — RESPIRATORY VIRUS PANEL
Adenovirus: NEGATIVE
INFLUENZA A: NEGATIVE
INFLUENZA B 1: NEGATIVE
METAPNEUMOVIRUS: NEGATIVE
PARAINFLUENZA 1 A: NEGATIVE
Parainfluenza 2: NEGATIVE
Parainfluenza 3: NEGATIVE
Respiratory Syncytial Virus A: NEGATIVE
Respiratory Syncytial Virus B: NEGATIVE
Rhinovirus: NEGATIVE

## 2014-08-07 LAB — CULTURE, RESPIRATORY W GRAM STAIN

## 2014-08-07 MED ORDER — POTASSIUM CHLORIDE CRYS ER 20 MEQ PO TBCR
40.0000 meq | EXTENDED_RELEASE_TABLET | Freq: Three times a day (TID) | ORAL | Status: DC
  Administered 2014-08-07: 40 meq via ORAL
  Filled 2014-08-07: qty 2

## 2014-08-07 MED ORDER — FUROSEMIDE 10 MG/ML IJ SOLN: 20.0000 mg | Freq: Once | INTRAMUSCULAR | Status: DC

## 2014-08-07 MED ORDER — POLYETHYLENE GLYCOL 3350 17 G PO PACK
17.0000 g | PACK | Freq: Every day | ORAL | Status: DC
  Administered 2014-08-07: 17 g via ORAL
  Filled 2014-08-07: qty 1

## 2014-08-07 MED ORDER — FERROUS SULFATE 325 (65 FE) MG PO TABS
325.0000 mg | ORAL_TABLET | Freq: Three times a day (TID) | ORAL | Status: DC
  Administered 2014-08-07: 325 mg via ORAL
  Filled 2014-08-07 (×2): qty 1

## 2014-08-07 MED ORDER — POLYETHYLENE GLYCOL 3350 17 G PO PACK: 17.0000 g | PACK | Freq: Every day | ORAL | Status: AC

## 2014-08-07 MED ORDER — LEVOFLOXACIN 500 MG PO TABS: 500.0000 mg | ORAL_TABLET | Freq: Every day | ORAL | Status: DC

## 2014-08-07 MED ORDER — FUROSEMIDE 10 MG/ML IJ SOLN: 40.0000 mg | Freq: Once | INTRAMUSCULAR | Status: DC

## 2014-08-07 MED ORDER — METRONIDAZOLE 500 MG PO TABS: 500.0000 mg | ORAL_TABLET | Freq: Two times a day (BID) | ORAL | Status: DC

## 2014-08-07 MED ORDER — FERROUS SULFATE 325 (65 FE) MG PO TABS: 325.0000 mg | ORAL_TABLET | Freq: Three times a day (TID) | ORAL | Status: AC

## 2014-08-07 NOTE — Progress Notes (Signed)
[facility] PROGRESS NOTE  Assessment/Plan: Sepsis due to community-acquired pneumonia: - He was started empirically on vancomycin, cefepime and Tamiflu an admission on 08/07/2014 - Workup was negative so antibiotic regimen was D escalated to Levaquin. - She was treated with aggressive fluid hydration and patient started complaining of swelling of the legs, IV fluids were KVO she was given a single dose of Lasix. Apply ted hose place in trendelenburg. - Weight on admission 78.5 kg. Start daily weights  Trichomonas UTI: Change Flagyl to oral 3 times a day  Protein-calorie malnutrition, severe: - ensure TID.  Nausea vomiting and diarrhea - Resolved unclear etiology.  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 Continue Decadron for seizure prophylaxis.  Essential hypertension - Continue current home regimen.   Microcytic anemia: -Started on ferrous sulfate twice a day.  Code Status: full Family Communication: none  Disposition Plan: inpatient   Consultants:  None  Procedures:  None  Antibiotics:  levaquin  HPI/Subjective: Feels weak  Objective: Filed Vitals:   08/06/14 0500 08/06/14 1420 08/06/14 2040 08/07/14 0551  BP: 110/65 132/88 143/77 129/79  Pulse: 86 101 93 103  Temp: 98.4 F (36.9 C) 97.4 F (36.3 C) 98.3 F (36.8 C) 98.2 F (36.8 C)  TempSrc: Oral Oral Oral Oral  Resp: 18 20 18 18   Height:      Weight:      SpO2: 98% 98% 96% 96%    Intake/Output Summary (Last 24 hours) at 08/07/14 0851 Last data filed at 08/07/14 0800  Gross per 24 hour  Intake    120 ml  Output      0 ml  Net    120 ml   Filed Weights   08/01/14 1849 08/01/14 2347  Weight: 79.379 kg (175 lb) 78.5 kg (173 lb 1 oz)    Exam:  General: Alert, awake, oriented x3, in no acute distress.  HEENT: No bruits, no goiter.  Heart: Regular rate and rhythm. Lungs:  Good air movement, clear Abdomen: Soft, nontender, nondistended, positive bowel sounds.  Neuro: Grossly intact, nonfocal.   Data Reviewed: Basic Metabolic Panel:  Recent Labs Lab 08/02/14 0135 08/03/14 0540 08/04/14 0415 08/05/14 0426 08/06/14 0850  NA 141 140 140 142 140  K 3.0* 3.9 3.6 3.6 3.2*  CL 109 111 110 112 110  CO2 21 20 21 23 23   GLUCOSE 86 106* 118* 122* 130*  BUN 14 18 14 16 19   CREATININE 1.03 0.73 0.69 0.79 0.79  CALCIUM 7.3* 7.3* 7.8* 8.3* 8.3*   Liver Function Tests:  Recent Labs Lab 08/01/14 1854 08/02/14 0135  AST 37 28  ALT 19 16  ALKPHOS 61 49  BILITOT 1.5* 1.0  PROT 7.9 5.7*  ALBUMIN 2.9* 2.1*   No results for input(s): LIPASE, AMYLASE in the last 168 hours. No results for input(s): AMMONIA in the last 168 hours. CBC:  Recent Labs Lab 08/02/14 0135 08/03/14 0540 08/04/14 0415 08/05/14 0426 08/06/14 0850  WBC 21.3* 18.5* 16.8* 15.4* 14.6*  HGB 9.9* 9.5* 8.4* 8.6* 10.1*  HCT 29.2* 28.1* 24.9* 25.5* 29.9*  MCV 75.3* 75.3* 74.1* 74.6* 74.0*  PLT 275 173 186 171 201   Cardiac Enzymes: No results for input(s): CKTOTAL, CKMB, CKMBINDEX, TROPONINI in the last 168 hours. BNP (last 3 results) No results for input(s): BNP in the last 8760 hours.  ProBNP (last 3 results) No results for input(s): PROBNP in the last  8760 hours.  CBG:  Recent Labs Lab 08/03/14 0743 08/04/14 0734 08/05/14 0755 08/06/14 0746 08/07/14 0757  GLUCAP 106* 109* 106* 104* 151*    Recent Results (from the past 240 hour(s))  Blood Culture (routine x 2)     Status: None (Preliminary result)   Collection Time: 08/01/14  6:54 PM  Result Value Ref Range Status   Specimen Description RIGHT ANTECUBITAL  Final   Special Requests BOTTLES DRAWN AEROBIC AND ANAEROBIC 5CC  Final   Culture   Final           BLOOD CULTURE RECEIVED NO GROWTH TO DATE CULTURE WILL BE HELD FOR 5 DAYS BEFORE ISSUING A FINAL NEGATIVE REPORT Performed at [facility]    Report  Status PENDING  Incomplete  Blood Culture (routine x 2)     Status: None (Preliminary result)   Collection Time: 08/01/14  8:15 PM  Result Value Ref Range Status   Specimen Description BLOOD RIGHT HAND  Final   Special Requests BOTTLES DRAWN AEROBIC AND ANAEROBIC 4CC  Final   Culture   Final           BLOOD CULTURE RECEIVED NO GROWTH TO DATE CULTURE WILL BE HELD FOR 5 DAYS BEFORE ISSUING A FINAL NEGATIVE REPORT Performed at [facility]    Report Status PENDING  Incomplete  Urine culture     Status: None   Collection Time: 08/03/14 10:26 PM  Result Value Ref Range Status   Specimen Description URINE, CLEAN CATCH  Final   Special Requests NONE  Final   Colony Count NO GROWTH Performed at [facility]   Final   Culture NO GROWTH Performed at [facility]   Final   Report Status 08/05/2014 FINAL  Final  Culture, sputum-assessment     Status: None   Collection Time: 08/05/14 10:25 AM  Result Value Ref Range Status   Specimen Description SPUTUM  Final   Special Requests NONE  Final   Sputum evaluation   Final    THIS SPECIMEN IS ACCEPTABLE. RESPIRATORY CULTURE REPORT TO FOLLOW.   Report Status 08/05/2014 FINAL  Final  Culture, respiratory (NON-Expectorated)     Status: None   Collection Time: 08/05/14 10:25 AM  Result Value Ref Range Status   Specimen Description SPUTUM  Final   Special Requests NONE  Final   Gram Stain   Final    ABUNDANT WBC PRESENT, PREDOMINANTLY PMN FEW SQUAMOUS EPITHELIAL CELLS PRESENT FEW GRAM POSITIVE COCCI IN PAIRS Performed at [facility]    Culture   Final    NORMAL OROPHARYNGEAL FLORA Performed at [facility]    Report Status 08/07/2014 FINAL  Final     Studies: No results found.  Scheduled Meds: . atorvastatin  10 mg Oral QHS  . benzonatate  100 mg Oral TID  . cholecalciferol  1,000 Units Oral Daily  . dexamethasone  4 mg Oral TID  . dronabinol  2.5 mg Oral BID AC  . enoxaparin (LOVENOX)  injection  1 mg/kg Subcutaneous Q12H  . folic acid  1 mg Oral Daily  . levETIRAcetam  1,000 mg Oral BID  . levofloxacin  500 mg Oral Q1400  . metroNIDAZOLE  500 mg Oral Q12H  . mirtazapine  15 mg Oral QHS  . potassium chloride  40 mEq Oral Once  . sodium chloride  3 mL Intravenous Q12H  . sodium chloride  3 mL Intravenous Q12H   Continuous Infusions:    FELIZ  Marguarite Arbour  [facility] Pager [phone]. If 7PM-7AM, please contact night-coverage at [email or web address], password [email or web address] 08/07/2014, 8:51 AM  LOS: 6 days

## 2014-08-07 NOTE — Telephone Encounter (Signed)
Spoke with Myriam Jacobson at [facility]. Confirmed patient's appointment for tomorrow.

## 2014-08-07 NOTE — Progress Notes (Signed)
Report called to [place]. Report given to Cline Crock. VSS. No changes noted with Pt's assessment at this time

## 2014-08-07 NOTE — Discharge Summary (Addendum)
Physician Discharge Summary  Michelle Cobb YIF:[id number] DOB: 1941/02/24 DOA: 08/01/2014  PCP: Woody Seller, MD  Admit date: 08/01/2014 Discharge date: 08/07/2014  Time spent: 30 minutes  Recommendations for Outpatient Follow-up:  1. Follow up with PC in 2 weeks. To evaluate  Improvement of her PNA.  Discharge Diagnoses:  Principal Problem:   Sepsis Active Problems:   Hyperlipidemia   Brain metastasis   Focal seizures   DVT (deep venous thrombosis)   Essential hypertension   Hypokalemia   Weakness generalized   Malignant neoplasm of lower lobe of left lung   Cough   Nausea vomiting and diarrhea   Protein-calorie malnutrition, severe   Discharge Condition: guarded  Diet recommendation: regular  Filed Weights   08/01/14 1849 08/01/14 2347  Weight: 79.379 kg (175 lb) 78.5 kg (173 lb 1 oz)    History of present illness:  74 y.o. 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  Patient reports she started having worsening cough, runny nose, but no sore throat today. She feels whole-body aching and feels very weak. She coughs up some white colored mucus. No significant chest pain. She also has nausea, vomiting and diarrhea. She reports that she has diarrhea with loose stool and vomiting all day today. She does not have significant abdominal pain. Patient has fever and chills.  Patient denies dysuria, urgency, frequency, hematuria, skin rashes or leg swelling. No unilateral weakness, numbness or tingling sensations. No vision change or hearing loss.   Hospital Course:  Sepsis due to community-acquired pneumonia: - She was started empirically on vancomycin,  cefepime and Tamiflu an admission on 08/01/2014 - Workup was negative so antibiotic regimen was Descalated to Levaquin which will continue for an additional day as an outpatient. - She was treated with aggressive fluid hydration and patient started complaining of swelling of the legs, IV fluids were KVO she was given a single dose of Lasix. Now resolved. - Weight on admission 78.5 kg.   Trichomonas UTI: Change Flagyl to oral 3 times a day. For 2 additional days.  Protein-calorie malnutrition, severe: - ensure TID.  Nausea vomiting and diarrhea - Resolved unclear etiology.  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 Continue Decadron for seizure prophylaxis.  Essential hypertension - Continue current home regimen.  Microcytic anemia: -Started on ferrous sulfate twice a day.  Procedures:  Chest x-ray  Consultations:  None  Discharge Exam: Filed Vitals:   08/07/14 0551  BP: 129/79  Pulse: 103  Temp: 98.2 F (36.8 C)  Resp: 18    General: See progress notes  Discharge Instructions   Discharge Instructions    Diet - low sodium heart healthy    Complete by:  As directed      Increase activity slowly    Complete by:  As directed           Current Discharge Medication List    START taking these medications   Details  ferrous sulfate 325 (65 FE) MG tablet Take 1 tablet (325 mg total) by mouth 3 (three) times daily with meals. Qty: 90 tablet, Refills: 3    levofloxacin (LEVAQUIN) 500 MG tablet Take 1 tablet (500  mg total) by mouth daily at 2 PM daily at 2 PM. Qty: 1 tablet, Refills: 0    metroNIDAZOLE (FLAGYL) 500 MG tablet Take 1 tablet (500 mg total) by mouth every 12 (twelve) hours. Qty: 2 tablet, Refills: 0    polyethylene glycol (MIRALAX / GLYCOLAX) packet Take 17 g by mouth daily. Qty: 14 each, Refills: 0      CONTINUE these  medications which have NOT CHANGED   Details  atorvastatin (LIPITOR) 10 MG tablet Take 10 mg by mouth at bedtime.  Refills: 0    cholecalciferol (VITAMIN D) 1000 UNITS tablet Take 1,000 Units by mouth daily.    dexamethasone (DECADRON) 4 MG tablet Take 1 tablet (4 mg total) by mouth 3 (three) times daily. TID for 3 days, then BID 1 week, then 2 mg bid 5 days, 2 mg daily 5days,then stop Qty: 60 tablet, Refills: 2   Associated Diagnoses: Brain metastasis    dronabinol (MARINOL) 2.5 MG capsule Take 1 capsule (2.5 mg total) by mouth 2 (two) times daily before a meal. Qty: 60 capsule, Refills: 3   Associated Diagnoses: Brain metastasis    enoxaparin (LOVENOX) 80 MG/0.8ML injection Inject 80 mg into the skin every 12 (twelve) hours.  Refills: 1    folic acid (FOLVITE) 1 MG tablet Take 1 mg by mouth daily.  Refills: 3    hydrochlorothiazide (HYDRODIURIL) 25 MG tablet Take 25 mg by mouth daily.    levETIRAcetam (KEPPRA) 1000 MG tablet Take 1,000 mg by mouth 2 (two) times daily. Refills: 2    mirtazapine (REMERON) 15 MG tablet Take 1 tablet (15 mg total) by mouth at bedtime. Qty: 30 tablet, Refills: 2    potassium chloride SA (K-DUR,KLOR-CON) 20 MEQ tablet Take 1 tablet (20 mEq total) by mouth 2 (two) times daily. Qty: 60 tablet, Refills: 1    PRESCRIPTION MEDICATION Chemotherapy.      STOP taking these medications     HYDROcodone-homatropine (HYCODAN) 5-1.5 MG/5ML syrup      oxyCODONE-acetaminophen (PERCOCET/ROXICET) 5-325 MG per tablet        No Known Allergies    The results of significant diagnostics from this hospitalization (including imaging, microbiology, ancillary and laboratory) are listed below for reference.    Significant Diagnostic Studies: Dg Chest 1 View  07/18/2014   CLINICAL DATA:  Pain following thoracentesis.  Left-sided effusion.  EXAM: CHEST  1 VIEW  COMPARISON:  CT chest with contrast 07/11/2014.  FINDINGS: The heart size is normal. There is a  significant decrease in the left pleural effusion. There is no pneumothorax. The right lung is clear. The visualized soft tissues and bony thorax are otherwise unremarkable.  IMPRESSION: 1. Decreased left pleural effusion following thoracentesis. 2. No significant pneumothorax or other complication evident by radiograph. 3. The right lung is clear.   Electronically Signed   By: San Morelle M.D.   On: 07/18/2014 12:07   Dg Chest 2 View  08/01/2014   CLINICAL DATA:  Metastatic lung ca - cough - SOB - Pt resides at home. Pt c/o of SOB N/V. Symptoms started after returning home from here for MRI today  EXAM: CHEST - 2 VIEW  COMPARISON:  07/23/2014  FINDINGS: Moderate left pleural effusion. Consolidation/ atelectasis at the left lung base as before. Right lung clear. Heart size difficult to assess due to adjacent opacity. No pneumothorax Visualized skeletal structures are unremarkable.  IMPRESSION: 1. Stable moderate left pleural effusion and adjacent basilar consolidation/atelectasis.   Electronically Signed   By:  Lucrezia Europe M.D.   On: 08/01/2014 20:10   Dg Chest 2 View  07/23/2014   CLINICAL DATA:  Focal left arm seizures; known history of brain cancer and lung cancer. Initial encounter.  EXAM: CHEST  2 VIEW  COMPARISON:  Chest radiograph performed 07/18/2014  FINDINGS: There is mildly increased moderate left pleural effusion with associated airspace opacity. The right lung appears clear. There is mild rightward mediastinal shift, likely reflecting the pleural effusion. No pneumothorax is seen. The underlying left lower lobe lung mass is not well characterized on radiograph.  The cardiomediastinal silhouette remains borderline normal in size. No acute osseous abnormalities are identified.  IMPRESSION: Mildly increased moderate left pleural effusion, with associated airspace opacity. Underlying known lung mass not well characterized on radiograph.   Electronically Signed   By: Garald Balding M.D.   On:  07/23/2014 18:17   Ct Head Wo Contrast  07/23/2014   CLINICAL DATA:  Focal seizure  EXAM: CT HEAD WITHOUT CONTRAST  TECHNIQUE: Contiguous axial images were obtained from the base of the skull through the vertex without intravenous contrast.  COMPARISON:  04/18/2014  FINDINGS: Skull and Sinuses:Negative for fracture or destructive process. The mastoids, middle ears, and imaged paranasal sinuses are clear.  Orbits: Dysconjugate gaze.  Brain: Known cerebral metastases in the bilateral posterior cerebral hemispheres, parietal on the right and frontal parietal on the left. The masses are cortically based on the right and subcortical on the left. Edema surrounding the left-sided metastasis has increased, but there is no midline shift or herniation, and the metastasis has increased in diameter to 18 mm (13 mm by contrast-enhanced MRI 05/01/2014). The edema tracks into the external and internal capsules posteriorly, making the posterior putamen prominent. Low-attenuation in the bilateral anterior internal capsules is chronic, likely from multiple dilated perivascular spaces and white matter gliosis based on previous brain MRI. There is no evidence of acute hemorrhage. No acute infarct or hydrocephalus.  IMPRESSION: Known brain metastases. Compared to 05/01/2014 brain MRI, a left posterior frontal metastasis has increased size and vasogenic edema.   Electronically Signed   By: Monte Fantasia M.D.   On: 07/23/2014 21:09   Ct Chest W Contrast  07/11/2014   CLINICAL DATA:  Stage IV left lung cancer, after chemotherapy  EXAM: CT CHEST, ABDOMEN, AND PELVIS WITH CONTRAST  TECHNIQUE: Multidetector CT imaging of the chest, abdomen and pelvis was performed following the standard protocol during bolus administration of intravenous contrast.  CONTRAST:  132m OMNIPAQUE IOHEXOL 300 MG/ML  SOLN  COMPARISON:  None.  FINDINGS: CT CHEST FINDINGS  Mediastinum/Nodes: Heart is normal in size. No pericardial effusion.  Coronary  atherosclerosis in the LAD.  Atherosclerotic calcifications of the aortic arch. Mild ectasia of the ascending thoracic aortic, measuring 3.4 cm.  Small mediastinal nodes, including a 5 mm AP window node in a 6 mm short axis prevascular node. 2.2 x 2.5 cm left hilar node (series 2/image 35), previously 1.8 x 2.6 cm.  Visualized thyroid is unremarkable.  Lungs/Pleura: 5.6 x 4.4 cm area of mass-like consolidation in the left lower lobe (series 2/image 46), corresponding to a prior 8.0 x 4.7 cm mass.  Moderate to large left pleural effusion, partially loculated, with associated pleural-based nodularity (series 2/image 50).  Visualized right lung is clear.  Moderate centrilobular and paraseptal emphysematous changes.  No pneumothorax.  Musculoskeletal: Mild degenerative changes of the thoracic spine.  CT ABDOMEN PELVIS FINDINGS  Hepatobiliary: Possible 8 x 11 mm hypoenhancing lesion in the central right  liver anterior to the intrahepatic IVC (series 2/image 54).  2.2 x 1.2 cm hypoenhancing metastasis posteriorly in the lateral segment left hepatic lobe (series 2/image 60), previously 7 mm.  Gallbladder is notable for multiple layering gallstones, without associated inflammatory changes. No intrahepatic or extrahepatic ductal dilatation.  Pancreas: Within normal limits.  Spleen: Within normal limits.  Adrenals/Urinary Tract: Adrenal glands unremarkable.  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  Right kidney is within normal limits.  No hydronephrosis.  Bladder is within normal limits.  Stomach/Bowel: Stomach is unremarkable.  No evidence of bowel obstruction.  Normal appendix.  Vascular/Lymphatic: Atherosclerotic calcifications of the abdominal aorta and branch vessels.  Fusiform ectasia of the infrarenal abdominal aorta, measuring 2.6 x 2.7 cm (series 2/image 37).  No suspicious abdominopelvic lymphadenopathy.  Reproductive: Status post hysterectomy.  No adnexal masses.  Other: No  abdominopelvic ascites.  Musculoskeletal: 8 mm sclerotic lesion in the superior endplate of L4, new, suspicious (sagittal image 71). Additional sclerotic lesion in the sacrum (sagittal image 70), grossly unchanged.  IMPRESSION: Mixed response.  Left lower lobe mass has decreased in size.  Associated mediastinal/left hilar lymphadenopathy is mildly decreased. Moderate to large left pleural effusion, likely malignant, new/increased.  Suspected mild progression of hepatic metastases.  New 8 mm sclerotic lesion at L4, suspicious for osseous metastasis.  Additional ancillary findings as above.   Electronically Signed   By: Julian Hy M.D.   On: 07/11/2014 11:10   Mr Jeri Cos ZO Contrast  07/29/2014   CLINICAL DATA:  Metastatic lung cancer. Three month stereotactic radiosurgery follow-up  EXAM: MRI HEAD WITHOUT AND WITH CONTRAST  TECHNIQUE: Multiplanar, multiecho pulse sequences of the brain and surrounding structures were obtained without and with intravenous contrast.  CONTRAST:  16 mL MultiHance IV  COMPARISON:  MRI 05/01/2014  FINDINGS: Multiple metastatic deposits. Increase in edema in the left parietal lobe secondary to enlarging hemorrhagic metastatic deposit. Right parietal edema also has progressed somewhat. Ventricle size remains normal. No significant shift of the midline structures. No acute infarct.  Punctate calcification right cerebellum unchanged without enhancement.  3 mm enhancing nodule right superior cerebellum unchanged consistent with metastatic deposit.  2 mm enhancing nodule central pons unchanged.  19 x 20 mm hemorrhagic lesion left parietal lobe has increased in size since the prior study. Increased surrounding white matter edema.  11 x 13 mm hemorrhagic enhancing lesion high right parietal lobe is similar in size. Slight increase in surrounding edema.  New enhancing lesion left frontal lobe over the convexity measuring 4 x 6.6 mm. This is consistent with metastatic disease and is new.   Calvarium is negative.  Paranasal sinuses clear  IMPRESSION: Multiple metastatic deposits are present as described above.  Hemorrhagic lesion left parietal lobe has increased in size with increase in surrounding edema.  New lesion over the left frontal convexity measuring 4 x 6.6 mm.  Right high parietal lesion is similar in size but slightly increased edema since the prior study. Other lesions are stable.   Electronically Signed   By: Franchot Gallo M.D.   On: 07/29/2014 15:06   Ct Abdomen Pelvis W Contrast  07/11/2014   CLINICAL DATA:  Stage IV left lung cancer, after chemotherapy  EXAM: CT CHEST, ABDOMEN, AND PELVIS WITH CONTRAST  TECHNIQUE: Multidetector CT imaging of the chest, abdomen and pelvis was performed following the standard protocol during bolus administration of intravenous contrast.  CONTRAST:  148m OMNIPAQUE IOHEXOL 300 MG/ML  SOLN  COMPARISON:  None.  FINDINGS: CT CHEST FINDINGS  Mediastinum/Nodes: Heart is normal in size. No pericardial effusion.  Coronary atherosclerosis in the LAD.  Atherosclerotic calcifications of the aortic arch. Mild ectasia of the ascending thoracic aortic, measuring 3.4 cm.  Small mediastinal nodes, including a 5 mm AP window node in a 6 mm short axis prevascular node. 2.2 x 2.5 cm left hilar node (series 2/image 35), previously 1.8 x 2.6 cm.  Visualized thyroid is unremarkable.  Lungs/Pleura: 5.6 x 4.4 cm area of mass-like consolidation in the left lower lobe (series 2/image 46), corresponding to a prior 8.0 x 4.7 cm mass.  Moderate to large left pleural effusion, partially loculated, with associated pleural-based nodularity (series 2/image 50).  Visualized right lung is clear.  Moderate centrilobular and paraseptal emphysematous changes.  No pneumothorax.  Musculoskeletal: Mild degenerative changes of the thoracic spine.  CT ABDOMEN PELVIS FINDINGS  Hepatobiliary: Possible 8 x 11 mm hypoenhancing lesion in the central right liver anterior to the intrahepatic IVC  (series 2/image 54).  2.2 x 1.2 cm hypoenhancing metastasis posteriorly in the lateral segment left hepatic lobe (series 2/image 60), previously 7 mm.  Gallbladder is notable for multiple layering gallstones, without associated inflammatory changes. No intrahepatic or extrahepatic ductal dilatation.  Pancreas: Within normal limits.  Spleen: Within normal limits.  Adrenals/Urinary Tract: Adrenal glands unremarkable.  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  Right kidney is within normal limits.  No hydronephrosis.  Bladder is within normal limits.  Stomach/Bowel: Stomach is unremarkable.  No evidence of bowel obstruction.  Normal appendix.  Vascular/Lymphatic: Atherosclerotic calcifications of the abdominal aorta and branch vessels.  Fusiform ectasia of the infrarenal abdominal aorta, measuring 2.6 x 2.7 cm (series 2/image 37).  No suspicious abdominopelvic lymphadenopathy.  Reproductive: Status post hysterectomy.  No adnexal masses.  Other: No abdominopelvic ascites.  Musculoskeletal: 8 mm sclerotic lesion in the superior endplate of L4, new, suspicious (sagittal image 71). Additional sclerotic lesion in the sacrum (sagittal image 70), grossly unchanged.  IMPRESSION: Mixed response.  Left lower lobe mass has decreased in size.  Associated mediastinal/left hilar lymphadenopathy is mildly decreased. Moderate to large left pleural effusion, likely malignant, new/increased.  Suspected mild progression of hepatic metastases.  New 8 mm sclerotic lesion at L4, suspicious for osseous metastasis.  Additional ancillary findings as above.   Electronically Signed   By: Julian Hy M.D.   On: 07/11/2014 11:10   US Thoracentesis Asp Pleural Space W/img Guide  07/18/2014   CLINICAL DATA:  Shortness of breath. Left-sided pleural effusion. Request diagnostic and therapeutic thoracentesis.  EXAM: ULTRASOUND GUIDED LEFT THORACENTESIS  COMPARISON:  None.  PROCEDURE: An ultrasound guided  thoracentesis was thoroughly discussed with the patient and questions answered. The benefits, risks, alternatives and complications were also discussed. The patient understands and wishes to proceed with the procedure. Written consent was obtained.  Ultrasound was performed to localize and mark an adequate pocket of fluid in the left chest. The area was then prepped and draped in the normal sterile fashion. 1% Lidocaine was used for local anesthesia. Under ultrasound guidance a 19 gauge Yueh catheter was introduced. Thoracentesis was performed. The catheter was removed and a dressing applied.  COMPLICATIONS: None  FINDINGS: A total of approximately 750 mL of clear, dark amber colored fluid was removed. A fluid sample wassent for laboratory analysis.  IMPRESSION: Successful ultrasound guided left thoracentesis yielding 750 mL of pleural fluid.  Read by: Ascencion Dike PA-C   Electronically Signed  By: Corrie Mckusick D.O.   On: 07/18/2014 11:27    Microbiology: Recent Results (from the past 240 hour(s))  Blood Culture (routine x 2)     Status: None (Preliminary result)   Collection Time: 08/01/14  6:54 PM  Result Value Ref Range Status   Specimen Description RIGHT ANTECUBITAL  Final   Special Requests BOTTLES DRAWN AEROBIC AND ANAEROBIC 5CC  Final   Culture   Final           BLOOD CULTURE RECEIVED NO GROWTH TO DATE CULTURE WILL BE HELD FOR 5 DAYS BEFORE ISSUING A FINAL NEGATIVE REPORT Performed at [facility]    Report Status PENDING  Incomplete  Blood Culture (routine x 2)     Status: None (Preliminary result)   Collection Time: 08/01/14  8:15 PM  Result Value Ref Range Status   Specimen Description BLOOD RIGHT HAND  Final   Special Requests BOTTLES DRAWN AEROBIC AND ANAEROBIC 4CC  Final   Culture   Final           BLOOD CULTURE RECEIVED NO GROWTH TO DATE CULTURE WILL BE HELD FOR 5 DAYS BEFORE ISSUING A FINAL NEGATIVE REPORT Performed at [facility]    Report Status PENDING   Incomplete  Urine culture     Status: None   Collection Time: 08/03/14 10:26 PM  Result Value Ref Range Status   Specimen Description URINE, CLEAN CATCH  Final   Special Requests NONE  Final   Colony Count NO GROWTH Performed at [facility]   Final   Culture NO GROWTH Performed at [facility]   Final   Report Status 08/05/2014 FINAL  Final  Culture, sputum-assessment     Status: None   Collection Time: 08/05/14 10:25 AM  Result Value Ref Range Status   Specimen Description SPUTUM  Final   Special Requests NONE  Final   Sputum evaluation   Final    THIS SPECIMEN IS ACCEPTABLE. RESPIRATORY CULTURE REPORT TO FOLLOW.   Report Status 08/05/2014 FINAL  Final  Culture, respiratory (NON-Expectorated)     Status: None   Collection Time: 08/05/14 10:25 AM  Result Value Ref Range Status   Specimen Description SPUTUM  Final   Special Requests NONE  Final   Gram Stain   Final    ABUNDANT WBC PRESENT, PREDOMINANTLY PMN FEW SQUAMOUS EPITHELIAL CELLS PRESENT FEW GRAM POSITIVE COCCI IN PAIRS Performed at [facility]    Culture   Final    NORMAL OROPHARYNGEAL FLORA Performed at [facility]    Report Status 08/07/2014 FINAL  Final     Labs: Basic Metabolic Panel:  Recent Labs Lab 08/02/14 0135 08/03/14 0540 08/04/14 0415 08/05/14 0426 08/06/14 0850  NA 141 140 140 142 140  K 3.0* 3.9 3.6 3.6 3.2*  CL 109 111 110 112 110  CO2 _0 GLUCOSE 86 106* 118* 122* 130*  BUN _1 CREATININE 1.03 0.73 0.69 0.79 0.79  CALCIUM 7.3* 7.3* 7.8* 8.3* 8.3*   Liver Function Tests:  Recent Labs Lab 08/01/14 1854 08/02/14 0135  AST 37 28  ALT 19 16  ALKPHOS 61 49  BILITOT 1.5* 1.0  PROT 7.9 5.7*  ALBUMIN 2.9* 2.1*   No results for input(s): LIPASE, AMYLASE in the last 168 hours. No results for input(s): AMMONIA in the last 168 hours. CBC:  Recent Labs Lab 08/02/14 0135 08/03/14 0540 08/04/14 0415 08/05/14 0426  08/06/14 0850  WBC 21.3* 18.5* 16.8* 15.4* 14.6*  HGB 9.9* 9.5* 8.4* 8.6* 10.1*  HCT 29.2* 28.1* 24.9* 25.5* 29.9*  MCV 75.3* 75.3* 74.1* 74.6* 74.0*  PLT 275 173 186 171 201   Cardiac Enzymes: No results for input(s): CKTOTAL, CKMB, CKMBINDEX, TROPONINI in the last 168 hours. BNP: BNP (last 3 results) No results for input(s): BNP in the last 8760 hours.  ProBNP (last 3 results) No results for input(s): PROBNP in the last 8760 hours.  CBG:  Recent Labs Lab 08/03/14 0743 08/04/14 0734 08/05/14 0755 08/06/14 0746 08/07/14 0757  GLUCAP 106* 109* 106* 104* 151*       Signed:  FELIZ ORTIZ, ABRAHAM  [facility] 08/07/2014, 9:16 AM

## 2014-08-07 NOTE — Progress Notes (Signed)
Patient is set to discharge to [facility] today. Patient & son, Christia Reading aware. Discharge packet given to RN, Anderson Malta. [facility] called for transport.   Clinical Social Work Department CLINICAL SOCIAL WORK PLACEMENT NOTE 08/07/2014  Patient:  CORIANNA, AVALLONE  Account Number:  [id number] [place] date:  08/01/2014  Clinical Social Worker:  Renold Genta  Date/time:  08/05/2014 02:54 PM  Clinical Social Work is seeking post-discharge placement for this patient at the following level of care:   SKILLED NURSING   (*CSW will update this form in Epic as items are completed)   08/05/2014  Patient/family provided with [place] Department of Clinical Social Work's list of facilities offering this level of care within the geographic area requested by the patient (or if unable, by the patient's family).  08/05/2014  Patient/family informed of their freedom to choose among providers that offer the needed level of care, that participate in Medicare, Medicaid or managed care program needed by the patient, have an available bed and are willing to accept the patient.  08/05/2014  Patient/family informed of [facility]' ownership interest in [facility], as well as of the fact that they are under no obligation to receive care at this facility.  PASARR submitted to EDS on 08/05/2014 PASARR number received on 08/05/2014  FL2 transmitted to all facilities in geographic area requested by pt/family on  08/05/2014 FL2 transmitted to all facilities within larger geographic area on   Patient informed that his/her managed care company has contracts with or will negotiate with  certain facilities, including the following:     Patient/family informed of bed offers received:  08/05/2014 Patient chooses bed at [facility] [place] Physician recommends and patient chooses bed at    Patient to be transferred to [facility] on   08/07/2014 Patient to be transferred to facility by [facility] Patient and family notified of transfer on 08/07/2014 Name of family member notified:  patient's son, Christia Reading via phone  The following physician request were entered in Epic:   Additional Comments:     Raynaldo Opitz, [place] Social Worker cell #: [phone]

## 2014-08-08 ENCOUNTER — Encounter: Payer: Self-pay | Admitting: Radiation Therapy

## 2014-08-08 ENCOUNTER — Inpatient Hospital Stay (HOSPITAL_COMMUNITY)
Admission: EM | Admit: 2014-08-08 | Discharge: 2014-08-13 | DRG: 308 | Disposition: A | Discharge status: Skilled Nursing Facility | Payer: Medicare Other | Attending: Family Medicine | Admitting: Family Medicine

## 2014-08-08 ENCOUNTER — Encounter (HOSPITAL_COMMUNITY): Payer: Self-pay | Admitting: Emergency Medicine

## 2014-08-08 ENCOUNTER — Ambulatory Visit
Admit: 2014-08-08 | Discharge: 2014-08-08 | Disposition: A | Discharge status: Home / Self Care | Payer: Medicare Other | Attending: Radiation Oncology | Admitting: Radiation Oncology

## 2014-08-08 ENCOUNTER — Ambulatory Visit: Admission: RE | Admit: 2014-08-08 | Payer: Medicare Other | Source: Ambulatory Visit | Admitting: Radiation Oncology

## 2014-08-08 ENCOUNTER — Ambulatory Visit: Admit: 2014-08-08 | Payer: Medicare Other | Admitting: Radiation Oncology

## 2014-08-08 ENCOUNTER — Emergency Department (HOSPITAL_COMMUNITY): Payer: Medicare Other

## 2014-08-08 DIAGNOSIS — Z86718 Personal history of other venous thrombosis and embolism: Secondary | ICD-10-CM | POA: Diagnosis not present

## 2014-08-08 DIAGNOSIS — D638 Anemia in other chronic diseases classified elsewhere: Secondary | ICD-10-CM | POA: Diagnosis present

## 2014-08-08 DIAGNOSIS — E785 Hyperlipidemia, unspecified: Secondary | ICD-10-CM | POA: Diagnosis present

## 2014-08-08 DIAGNOSIS — F419 Anxiety disorder, unspecified: Secondary | ICD-10-CM | POA: Diagnosis present

## 2014-08-08 DIAGNOSIS — E876 Hypokalemia: Secondary | ICD-10-CM | POA: Diagnosis present

## 2014-08-08 DIAGNOSIS — E43 Unspecified severe protein-calorie malnutrition: Secondary | ICD-10-CM | POA: Diagnosis present

## 2014-08-08 DIAGNOSIS — I82409 Acute embolism and thrombosis of unspecified deep veins of unspecified lower extremity: Secondary | ICD-10-CM | POA: Diagnosis present

## 2014-08-08 DIAGNOSIS — D630 Anemia in neoplastic disease: Secondary | ICD-10-CM | POA: Diagnosis present

## 2014-08-08 DIAGNOSIS — Z87891 Personal history of nicotine dependence: Secondary | ICD-10-CM

## 2014-08-08 DIAGNOSIS — C3432 Malignant neoplasm of lower lobe, left bronchus or lung: Secondary | ICD-10-CM | POA: Diagnosis present

## 2014-08-08 DIAGNOSIS — R0789 Other chest pain: Secondary | ICD-10-CM | POA: Diagnosis not present

## 2014-08-08 DIAGNOSIS — R079 Chest pain, unspecified: Secondary | ICD-10-CM

## 2014-08-08 DIAGNOSIS — R0902 Hypoxemia: Secondary | ICD-10-CM | POA: Diagnosis present

## 2014-08-08 DIAGNOSIS — C7931 Secondary malignant neoplasm of brain: Secondary | ICD-10-CM | POA: Diagnosis present

## 2014-08-08 DIAGNOSIS — R0600 Dyspnea, unspecified: Secondary | ICD-10-CM | POA: Diagnosis present

## 2014-08-08 DIAGNOSIS — N179 Acute kidney failure, unspecified: Secondary | ICD-10-CM | POA: Diagnosis present

## 2014-08-08 DIAGNOSIS — I10 Essential (primary) hypertension: Secondary | ICD-10-CM | POA: Diagnosis present

## 2014-08-08 DIAGNOSIS — Z6827 Body mass index (BMI) 27.0-27.9, adult: Secondary | ICD-10-CM | POA: Diagnosis not present

## 2014-08-08 DIAGNOSIS — Z90710 Acquired absence of both cervix and uterus: Secondary | ICD-10-CM | POA: Diagnosis not present

## 2014-08-08 DIAGNOSIS — J90 Pleural effusion, not elsewhere classified: Secondary | ICD-10-CM | POA: Diagnosis present

## 2014-08-08 DIAGNOSIS — J910 Malignant pleural effusion: Secondary | ICD-10-CM | POA: Diagnosis present

## 2014-08-08 DIAGNOSIS — R000 Tachycardia, unspecified: Principal | ICD-10-CM

## 2014-08-08 DIAGNOSIS — Z9889 Other specified postprocedural states: Secondary | ICD-10-CM

## 2014-08-08 DIAGNOSIS — Z7901 Long term (current) use of anticoagulants: Secondary | ICD-10-CM

## 2014-08-08 DIAGNOSIS — R531 Weakness: Secondary | ICD-10-CM

## 2014-08-08 DIAGNOSIS — C787 Secondary malignant neoplasm of liver and intrahepatic bile duct: Secondary | ICD-10-CM | POA: Diagnosis present

## 2014-08-08 LAB — CBC WITH DIFFERENTIAL/PLATELET
Basophils Absolute: 0 10*3/uL (ref 0.0–0.1)
Basophils Relative: 0 % (ref 0–1)
Eosinophils Absolute: 0 10*3/uL (ref 0.0–0.7)
Eosinophils Relative: 0 % (ref 0–5)
HEMATOCRIT: 32 % — AB (ref 36.0–46.0)
Hemoglobin: 11.1 g/dL — ABNORMAL LOW (ref 12.0–15.0)
LYMPHS PCT: 10 % — AB (ref 12–46)
Lymphs Abs: 1.7 10*3/uL (ref 0.7–4.0)
MCH: 25.7 pg — ABNORMAL LOW (ref 26.0–34.0)
MCHC: 34.7 g/dL (ref 30.0–36.0)
MCV: 74.1 fL — ABNORMAL LOW (ref 78.0–100.0)
Monocytes Absolute: 1 10*3/uL (ref 0.1–1.0)
Monocytes Relative: 6 % (ref 3–12)
NEUTROS ABS: 14.5 10*3/uL — AB (ref 1.7–7.7)
Neutrophils Relative %: 84 % — ABNORMAL HIGH (ref 43–77)
PLATELETS: 149 10*3/uL — AB (ref 150–400)
RBC: 4.32 MIL/uL (ref 3.87–5.11)
RDW: 18.4 % — ABNORMAL HIGH (ref 11.5–15.5)
WBC: 17.2 10*3/uL — AB (ref 4.0–10.5)

## 2014-08-08 LAB — COMPREHENSIVE METABOLIC PANEL
ALBUMIN: 2.4 g/dL — AB (ref 3.5–5.2)
ALK PHOS: 40 U/L (ref 39–117)
ALT: 15 U/L (ref 0–35)
AST: 18 U/L (ref 0–37)
Anion gap: 11 (ref 5–15)
BUN: 16 mg/dL (ref 6–23)
CALCIUM: 8.4 mg/dL (ref 8.4–10.5)
CHLORIDE: 111 mmol/L (ref 96–112)
CO2: 21 mmol/L (ref 19–32)
CREATININE: 0.69 mg/dL (ref 0.50–1.10)
GFR calc Af Amer: >90 mL/min (ref >90)
GFR calc non Af Amer: 84 mL/min — ABNORMAL LOW (ref >90)
Glucose, Bld: 79 mg/dL (ref 70–99)
POTASSIUM: 4.3 mmol/L (ref 3.5–5.1)
Sodium: 143 mmol/L (ref 135–145)
Total Bilirubin: 1 mg/dL (ref 0.3–1.2)
Total Protein: 6.1 g/dL (ref 6.0–8.3)

## 2014-08-08 LAB — CULTURE, BLOOD (ROUTINE X 2)
CULTURE: NO GROWTH
Culture: NO GROWTH

## 2014-08-08 LAB — BRAIN NATRIURETIC PEPTIDE: B NATRIURETIC PEPTIDE 5: 161.7 pg/mL — AB (ref 0.0–100.0)

## 2014-08-08 LAB — TROPONIN I: Troponin I: <0.03 ng/mL (ref <0.031)

## 2014-08-08 MED ORDER — DEXAMETHASONE 4 MG PO TABS
4.0000 mg | ORAL_TABLET | Freq: Three times a day (TID) | ORAL | Status: AC
  Administered 2014-08-08 – 2014-08-10 (×7): 4 mg via ORAL
  Filled 2014-08-08 (×9): qty 1

## 2014-08-08 MED ORDER — DEXAMETHASONE 2 MG PO TABS: 2.0000 mg | ORAL_TABLET | Freq: Two times a day (BID) | ORAL | Status: DC

## 2014-08-08 MED ORDER — HYDROCODONE-ACETAMINOPHEN 5-325 MG PO TABS
1.0000 | ORAL_TABLET | ORAL | Status: DC | PRN
  Administered 2014-08-10: 2 via ORAL
  Filled 2014-08-08: qty 2

## 2014-08-08 MED ORDER — DEXAMETHASONE 4 MG PO TABS
4.0000 mg | ORAL_TABLET | Freq: Two times a day (BID) | ORAL | Status: DC
  Administered 2014-08-10 – 2014-08-13 (×7): 4 mg via ORAL
  Filled 2014-08-08 (×6): qty 1

## 2014-08-08 MED ORDER — POLYETHYLENE GLYCOL 3350 17 G PO PACK
17.0000 g | PACK | Freq: Every day | ORAL | Status: DC
  Filled 2014-08-08: qty 1

## 2014-08-08 MED ORDER — ENOXAPARIN SODIUM 80 MG/0.8ML ~~LOC~~ SOLN: 80.0000 mg | Freq: Two times a day (BID) | SUBCUTANEOUS | Status: DC

## 2014-08-08 MED ORDER — FOLIC ACID 1 MG PO TABS
1.0000 mg | ORAL_TABLET | Freq: Every day | ORAL | Status: DC
  Administered 2014-08-09 – 2014-08-13 (×5): 1 mg via ORAL
  Filled 2014-08-08 (×5): qty 1

## 2014-08-08 MED ORDER — ALUM & MAG HYDROXIDE-SIMETH 200-200-20 MG/5ML PO SUSP
30.0000 mL | Freq: Four times a day (QID) | ORAL | Status: DC | PRN
Start: 2014-08-08 — End: 2014-08-13

## 2014-08-08 MED ORDER — DRONABINOL 2.5 MG PO CAPS
2.5000 mg | ORAL_CAPSULE | Freq: Two times a day (BID) | ORAL | Status: DC
  Administered 2014-08-09 – 2014-08-13 (×7): 2.5 mg via ORAL
  Filled 2014-08-08 (×7): qty 1

## 2014-08-08 MED ORDER — LEVETIRACETAM 500 MG PO TABS
1000.0000 mg | ORAL_TABLET | Freq: Two times a day (BID) | ORAL | Status: DC
  Administered 2014-08-08 – 2014-08-13 (×10): 1000 mg via ORAL
  Filled 2014-08-08 (×10): qty 2

## 2014-08-08 MED ORDER — TUBERCULIN PPD 5 UNIT/0.1ML ID SOLN: 5.0000 [IU] | Freq: Once | INTRADERMAL | Status: DC

## 2014-08-08 MED ORDER — DEXAMETHASONE 2 MG PO TABS: 2.0000 mg | ORAL_TABLET | Freq: Every day | ORAL | Status: DC

## 2014-08-08 MED ORDER — SODIUM CHLORIDE 0.9 % IJ SOLN
3.0000 mL | Freq: Two times a day (BID) | INTRAMUSCULAR | Status: DC
  Administered 2014-08-08 – 2014-08-13 (×9): 3 mL via INTRAVENOUS

## 2014-08-08 MED ORDER — POTASSIUM CHLORIDE CRYS ER 20 MEQ PO TBCR
20.0000 meq | EXTENDED_RELEASE_TABLET | Freq: Two times a day (BID) | ORAL | Status: DC
  Administered 2014-08-08 – 2014-08-13 (×10): 20 meq via ORAL
  Filled 2014-08-08 (×10): qty 1

## 2014-08-08 MED ORDER — ATORVASTATIN CALCIUM 10 MG PO TABS
10.0000 mg | ORAL_TABLET | Freq: Every day | ORAL | Status: DC
  Administered 2014-08-08 – 2014-08-09 (×2): 10 mg via ORAL
  Filled 2014-08-08 (×2): qty 1

## 2014-08-08 MED ORDER — VITAMIN D 1000 UNITS PO TABS
1000.0000 [IU] | ORAL_TABLET | Freq: Every day | ORAL | Status: DC
  Administered 2014-08-09 – 2014-08-13 (×5): 1000 [IU] via ORAL
  Filled 2014-08-08 (×5): qty 1

## 2014-08-08 MED ORDER — CARVEDILOL 6.25 MG PO TABS
6.2500 mg | ORAL_TABLET | Freq: Two times a day (BID) | ORAL | Status: DC
  Administered 2014-08-08 – 2014-08-13 (×9): 6.25 mg via ORAL
  Filled 2014-08-08 (×11): qty 1

## 2014-08-08 MED ORDER — METRONIDAZOLE 500 MG PO TABS: 500.0000 mg | ORAL_TABLET | Freq: Two times a day (BID) | ORAL | Status: DC

## 2014-08-08 MED ORDER — ENSURE COMPLETE PO LIQD
237.0000 mL | Freq: Two times a day (BID) | ORAL | Status: DC
  Administered 2014-08-12: 237 mL via ORAL

## 2014-08-08 MED ORDER — HYDROCHLOROTHIAZIDE 25 MG PO TABS
25.0000 mg | ORAL_TABLET | Freq: Every day | ORAL | Status: DC
  Administered 2014-08-09 – 2014-08-13 (×5): 25 mg via ORAL
  Filled 2014-08-08 (×5): qty 1

## 2014-08-08 MED ORDER — LEVOFLOXACIN 500 MG PO TABS
500.0000 mg | ORAL_TABLET | Freq: Once | ORAL | Status: AC
  Administered 2014-08-08: 500 mg via ORAL
  Filled 2014-08-08: qty 1

## 2014-08-08 MED ORDER — MIRTAZAPINE 15 MG PO TABS
15.0000 mg | ORAL_TABLET | Freq: Every day | ORAL | Status: DC
  Administered 2014-08-08 – 2014-08-12 (×5): 15 mg via ORAL
  Filled 2014-08-08 (×5): qty 1

## 2014-08-08 MED ORDER — FERROUS SULFATE 325 (65 FE) MG PO TABS
325.0000 mg | ORAL_TABLET | Freq: Every day | ORAL | Status: DC
  Administered 2014-08-10 – 2014-08-13 (×4): 325 mg via ORAL
  Filled 2014-08-08 (×4): qty 1

## 2014-08-08 MED ORDER — IPRATROPIUM-ALBUTEROL 0.5-2.5 (3) MG/3ML IN SOLN
3.0000 mL | Freq: Once | RESPIRATORY_TRACT | Status: AC
  Administered 2014-08-08: 3 mL via RESPIRATORY_TRACT
  Filled 2014-08-08: qty 3

## 2014-08-08 NOTE — ED Notes (Signed)
Per RN pt from radiation oncology, pt co SOB before treatment. Pt was tachy 152 HR BP 175/105 O2 98 % RA. HX brain and liver cancer. Pt is alert and oriented x4.  Pt from [facility] . Pt is ambulatory with use of wheelchair due to weakness.

## 2014-08-08 NOTE — H&P (Signed)
[facility] History and Physical  Michelle Cobb TKP:[id number] DOB: 1940/06/14 DOA: 08/08/2014  Referring physician: ED PCP: Woody Seller, MD  Specialists: Oncology, INR  Chief Complaint: Multiple  HPI:  74 y/o ?  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 During the hospitalization she was complaining of lower extremity swelling and IV Lasix was given.  she represented to radiotherapy today and was found to be tachycardic and kidney-it was thought that when she was there she got anxious with a facemask been placed over her mouth and her heart rate was in the 150s. On arrival to the emergency room where she was transported her heart rate was in the low 100s and this is her chronic usual state. Workup in the emergency room revealed normal labs, proBNP 161, WBC 17.2, hemoglobin 11.1, MCV 74, Predominant neutrophilia Portable chest x-ray 1 view 3/17 showed a stable moderate-sized left-sided pleural effusion She is a little confused by the sudden turn of events but can tell me most of what is going on - Chest pain - nausea - vomiting - unilateral weakness, - fall, + Sputum, - fever - Chills, - myalgias    Review of Systems:   A 14 point ROS was performed and is negative except as noted in the HPI   Past Medical History  Diagnosis Date  . Hypertension   . Hyperlipidemia   . Knee injury 1983    left  . Lung cancer dx'd 03/2014  . Brain cancer   . Seizures    Past Surgical History  Procedure Laterality Date  . Video bronchoscopy Bilateral 04/19/2014    Procedure: VIDEO BRONCHOSCOPY WITHOUT FLUORO;  Surgeon: Rigoberto Noel, MD;  Location: [place];   Service: Cardiopulmonary;  Laterality: Bilateral;  . Abdominal hysterectomy  1983   Social History:  History   Social History Narrative    No Known Allergies  Family History  Problem Relation Age of Onset  . Cancer Sister 13    unknown detail     Prior to Admission medications   Medication Sig Start Date End Date Taking? Authorizing Provider  cholecalciferol (VITAMIN D) 1000 UNITS tablet Take 1,000 Units by mouth daily.   Yes Historical Provider, MD  dexamethasone (DECADRON) 4 MG tablet Take 1 tablet (4 mg total) by mouth 3 (three) times daily. TID for 3 days, then BID 1 week, then 2 mg bid 5 days, 2 mg daily 5days,then stop Patient taking differently: Take by mouth. TID for 3 days, then BID 1 week, then 2 mg bid 5 days, 2 mg daily 5days,then stop 07/24/14  Yes Tyler Pita, MD  dronabinol (MARINOL) 2.5 MG capsule Take 1 capsule (2.5 mg total) by mouth 2 (two) times daily before a meal. 08/01/14  Yes Tyler Pita, MD  enoxaparin (LOVENOX) 80 MG/0.8ML injection Inject 80 mg into the skin every 12 (twelve) hours.  06/28/14  Yes Historical Provider, MD  ferrous sulfate 325 (65 FE) MG tablet Take 1 tablet (325 mg total) by mouth 3 (three) times daily with meals. Patient taking differently: Take 325 mg by mouth daily with breakfast.  08/07/14  Yes Charlynne Cousins, MD  folic acid (FOLVITE) 1 MG tablet Take 1 mg by mouth daily.  05/23/14  Yes Historical Provider, MD  hydrochlorothiazide (HYDRODIURIL) 25 MG tablet Take 25 mg by mouth daily.   Yes Historical Provider, MD  levETIRAcetam (KEPPRA) 1000 MG tablet Take 1,000 mg by mouth 2 (two) times daily. 07/09/14  Yes Historical Provider, MD  levofloxacin (LEVAQUIN) 500 MG tablet Take 1 tablet (500 mg total) by mouth daily at 2 PM daily at 2 PM. 08/07/14  Yes Charlynne Cousins, MD  metroNIDAZOLE (FLAGYL) 500 MG tablet Take 1 tablet (500 mg total) by mouth every 12 (twelve) hours. 08/07/14  Yes Charlynne Cousins, MD  mirtazapine (REMERON) 15  MG tablet Take 1 tablet (15 mg total) by mouth at bedtime. 07/15/14  Yes Truitt Merle, MD  polyethylene glycol (MIRALAX / GLYCOLAX) packet Take 17 g by mouth daily. 08/07/14  Yes Charlynne Cousins, MD  potassium chloride SA (K-DUR,KLOR-CON) 20 MEQ tablet Take 1 tablet (20 mEq total) by mouth 2 (two) times daily. 07/15/14  Yes Truitt Merle, MD  PRESCRIPTION MEDICATION Chemotherapy.   Yes Historical Provider, MD  tuberculin 5 UNIT/0.1ML injection Inject 5 Units into the skin once.   Yes Historical Provider, MD  atorvastatin (LIPITOR) 10 MG tablet Take 10 mg by mouth at bedtime.  02/18/14   Historical Provider, MD   Physical Exam: Filed Vitals:   08/08/14 1348  BP: 131/81  Pulse: 115  Temp: 97.8 F (36.6 C)  TempSrc: Oral  Resp: 16  SpO2: 96%   EOMI NCAT moderately nourished Moderate dentition Mild JVD, no carotid bruit S1-S2 tachycardia, regular rate rhythm Decreased air entry posterior lung field with increased vocal resonance Abdomen soft nontender nondistended no rebound Mild lower extremity edema Range of motion intact, power 5/5 bilaterally, EOMI, NCAT, Reflexes grossly normal in lower extremities in knees    Labs on Admission:  Basic Metabolic Panel:  Recent Labs Lab 08/03/14 0540 08/04/14 0415 08/05/14 0426 08/06/14 0850 08/08/14 1531  NA 140 140 142 140 143  K 3.9 3.6 3.6 3.2* 4.3  CL 111 110 112 110 111  CO2 20 21 23 23 21   GLUCOSE 106* 118* 122* 130* 79  BUN 18 14 16 19 16   CREATININE 0.73 0.69 0.79 0.79 0.69  CALCIUM 7.3* 7.8* 8.3* 8.3* 8.4   Liver Function Tests:  Recent Labs Lab 08/01/14 1854 08/02/14 0135 08/08/14 1531  AST 37 28 18  ALT 19 16 15   ALKPHOS 61 49 40  BILITOT 1.5* 1.0 1.0  PROT 7.9 5.7* 6.1  ALBUMIN 2.9* 2.1* 2.4*   No results for input(s): LIPASE, AMYLASE in the last 168 hours. No results for input(s): AMMONIA in the last 168 hours. CBC:  Recent Labs Lab 08/03/14 0540 08/04/14 0415 08/05/14 0426 08/06/14 0850 08/08/14 1531    WBC 18.5* 16.8* 15.4* 14.6* 17.2*  NEUTROABS  --   --   --   --  PENDING  HGB 9.5* 8.4* 8.6* 10.1* 11.1*  HCT 28.1* 24.9* 25.5* 29.9* 32.0*  MCV 75.3* 74.1* 74.6* 74.0* 74.1*  PLT 173 186 171 201 149*   Cardiac Enzymes:  Recent Labs Lab 08/08/14 1531  TROPONINI <0.03    BNP (last 3 results)  Recent Labs  08/08/14 1531  BNP 161.7*    ProBNP (last 3 results) No results for input(s): PROBNP in the last 8760 hours.  CBG:  Recent Labs Lab 08/03/14 0743 08/04/14 0734 08/05/14 0755 08/06/14 0746 08/07/14 0757  GLUCAP 106* 109* 106* 104* 151*    Radiological Exams on Admission: Dg Chest Port 1 View  08/08/2014  CLINICAL DATA:  Shortness of breath before treatment.  EXAM: PORTABLE CHEST - 1 VIEW  COMPARISON:  08/01/2014  FINDINGS: Stable moderate left pleural effusion. There is likely associated mild compressive atelectasis. There is no other focal parenchymal opacity. There is no right pleural effusion. There is no pneumothorax. Stable cardiomediastinal silhouette. No acute osseous abnormality.  IMPRESSION: 1. Stable moderate left pleural effusion.   Electronically Signed   By: Kathreen Devoid   On: 08/08/2014 15:03    EKG: Independently reviewed. Sinus tachycardia PR interval 0.12 QRS axis 45 mild ST-T wave changes in V1 however wondering baseline, flipped T wave in V5 but poor R-wave progression-compared to EKG performed 08/01/13,  Assessment/Plan Principal Problem:   Tachycardia-patient has a chronic sinus tachycardia. It is noted that the patient is not on any PTCA beta blocker. I suspect her tachycardia is related to her stage IV cancer. She does not appear volume depleted but does have third spacing because of her pleural effusion. I will start low-dose of Coreg 6. 25 twice a day Another element of her tachycardia could be her anxiety because of her diagnosis. She will need holistic therapies with this. See below. Active Problems:   DVT (deep venous thrombosis)-the  patient is on therapeutic doses of Lovenox. This may need to be held in case we decide on doing a therapeutic thoracocentesis. I will speak to pharmacy to delineate this.   Chronic left-sided pleural effusion-we will consult interventional radiology--we will keep her nothing by mouth after midnight. I will ask pharmacy to ensure that we can determine the need to hold Lovenox.  She may benefit from a Pleurx drain as per my discussion with oncologist as this may be end-of-life issues that we are dealing with and this may be better for palliation.   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 Patient also noted to be on therapeutic doses of Lovenox. Monitor stools for darkness   Recent possible pneumonia-continue Levaquin and Flagyl. 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 Her performance scale/score is good but with progression of her disease and repeated issues, we will need potentially palliative care to be involved in her further care. I will forward this note to Dr. [name] as well as she was undergoing active stereotactic XRT   Hypoxia    Hypertension-continue HCTZ 25 mg daily.  In addition I will start low-dose Coreg as above   Time spent: 55 minutes CODE STATUS is full-to be determined by oncology who will discuss with her holistically in the morning   Verlon Au [facility] Pager [phone]  If 7PM-7AM, please contact night-coverage [email or web address] Password [facility] 08/08/2014, 5:12 PM

## 2014-08-08 NOTE — ED Provider Notes (Signed)
CSN: [id number]     Arrival date & time 08/08/14  1347 History   First MD Initiated Contact with Patient 08/08/14 1351     Chief Complaint  Patient presents with  . Shortness of Breath    Patient is a 74 y.o. female presenting with shortness of breath. The history is provided by the patient. No language interpreter was used.  Shortness of Breath  Michelle Cobb presents for evaluation of shortness of breath.  She presented to the cancer center today for a procedure, but when the mask was placed she became very short of breath.  Her SOB comes and goes for an unclear amount of time.  She feels better than earlier today but still feels SOB.  She denies any new chest pain but she does have pain everywhere on the left side from cancer (this is an ongoing problem).  She denies fevers.  She has a cough productive of mucous, leg edema.  Sxs are moderate, constant, worsening.     Past Medical History  Diagnosis Date  . Hypertension   . Hyperlipidemia   . Knee injury 1983    left  . Lung cancer dx'd 03/2014  . Brain cancer   . Seizures    Past Surgical History  Procedure Laterality Date  . Video bronchoscopy Bilateral 04/19/2014    Procedure: VIDEO BRONCHOSCOPY WITHOUT FLUORO;  Surgeon: Rigoberto Noel, MD;  Location: [place];  Service: Cardiopulmonary;  Laterality: Bilateral;  . Abdominal hysterectomy  1983   Family History  Problem Relation Age of Onset  . Cancer Sister 21    unknown detail    History  Substance Use Topics  . Smoking status: Former Research scientist (life sciences)  . Smokeless tobacco: Not on file  . Alcohol Use: Yes     Comment: 2 oz/week   OB History    No data available     Review of Systems  Respiratory: Positive for shortness of breath.   All other systems reviewed and are negative.     Allergies  Review of patient's allergies indicates no known allergies.  Home Medications   Prior to Admission medications   Medication Sig Start Date End Date Taking? Authorizing Provider   atorvastatin (LIPITOR) 10 MG tablet Take 10 mg by mouth at bedtime.  02/18/14   Historical Provider, MD  cholecalciferol (VITAMIN D) 1000 UNITS tablet Take 1,000 Units by mouth daily.    Historical Provider, MD  dexamethasone (DECADRON) 4 MG tablet Take 1 tablet (4 mg total) by mouth 3 (three) times daily. TID for 3 days, then BID 1 week, then 2 mg bid 5 days, 2 mg daily 5days,then stop 07/24/14   Tyler Pita, MD  dronabinol (MARINOL) 2.5 MG capsule Take 1 capsule (2.5 mg total) by mouth 2 (two) times daily before a meal. 08/01/14   Tyler Pita, MD  enoxaparin (LOVENOX) 80 MG/0.8ML injection Inject 80 mg into the skin every 12 (twelve) hours.  06/28/14   Historical Provider, MD  ferrous sulfate 325 (65 FE) MG tablet Take 1 tablet (325 mg total) by mouth 3 (three) times daily with meals. 08/07/14   Charlynne Cousins, MD  folic acid (FOLVITE) 1 MG tablet Take 1 mg by mouth daily.  05/23/14   Historical Provider, MD  hydrochlorothiazide (HYDRODIURIL) 25 MG tablet Take 25 mg by mouth daily.    Historical Provider, MD  levETIRAcetam (KEPPRA) 1000 MG tablet Take 1,000 mg by mouth 2 (two) times daily. 07/09/14   Historical Provider, MD  levofloxacin [facility])  500 MG tablet Take 1 tablet (500 mg total) by mouth daily at 2 PM daily at 2 PM. 08/07/14   Charlynne Cousins, MD  metroNIDAZOLE (FLAGYL) 500 MG tablet Take 1 tablet (500 mg total) by mouth every 12 (twelve) hours. 08/07/14   Charlynne Cousins, MD  mirtazapine (REMERON) 15 MG tablet Take 1 tablet (15 mg total) by mouth at bedtime. 07/15/14   Truitt Merle, MD  polyethylene glycol [facility] Floria Raveling) packet Take 17 g by mouth daily. 08/07/14   Charlynne Cousins, MD  potassium chloride SA (K-DUR,KLOR-CON) 20 MEQ tablet Take 1 tablet (20 mEq total) by mouth 2 (two) times daily. 07/15/14   Truitt Merle, MD  PRESCRIPTION MEDICATION Chemotherapy.    Historical Provider, MD   BP 131/81 mmHg  Pulse 115  Temp(Src) 97.8 F (36.6 C) (Oral)  Resp 16  SpO2  96% Physical Exam  Constitutional: She is oriented to person, place, and time. She appears well-developed and well-nourished.  HENT:  Head: Normocephalic and atraumatic.  Cardiovascular: Regular rhythm.   No murmur heard. tachycardic  Pulmonary/Chest: Effort normal. No respiratory distress. She exhibits tenderness.  Inspiratory and expiratory wheezes bilaterally  Abdominal: Soft. There is no tenderness. There is no rebound and no guarding.  Musculoskeletal: She exhibits no tenderness.  2+ edema of BLE  Neurological: She is alert and oriented to person, place, and time.  Generalized weakness  Skin: Skin is warm and dry.  Psychiatric: She has a normal mood and affect. Her behavior is normal.  Nursing note and vitals reviewed.   ED Course  Procedures (including critical care time) Labs Review Labs Reviewed  COMPREHENSIVE METABOLIC PANEL - Abnormal; Notable for the following:    Albumin 2.4 (*)    GFR calc non Af Amer 84 (*)    All other components within normal limits  CBC WITH DIFFERENTIAL/PLATELET - Abnormal; Notable for the following:    WBC 17.2 (*)    Hemoglobin 11.1 (*)    HCT 32.0 (*)    MCV 74.1 (*)    MCH 25.7 (*)    RDW 18.4 (*)    Platelets 149 (*)    Neutrophils Relative % 84 (*)    Lymphocytes Relative 10 (*)    Neutro Abs 14.5 (*)    All other components within normal limits  BRAIN NATRIURETIC PEPTIDE - Abnormal; Notable for the following:    B Natriuretic Peptide 161.7 (*)    All other components within normal limits  CBC - Abnormal; Notable for the following:    WBC 13.0 (*)    Hemoglobin 10.4 (*)    HCT 30.2 (*)    MCV 73.7 (*)    MCH 25.4 (*)    RDW 18.3 (*)    All other components within normal limits  MRSA PCR SCREENING  TROPONIN I  BASIC METABOLIC PANEL    Imaging Review Dg Chest Port 1 View  08/08/2014   CLINICAL DATA:  Shortness of breath before treatment.  EXAM: PORTABLE CHEST - 1 VIEW  COMPARISON:  08/01/2014  FINDINGS: Stable moderate  left pleural effusion. There is likely associated mild compressive atelectasis. There is no other focal parenchymal opacity. There is no right pleural effusion. There is no pneumothorax. Stable cardiomediastinal silhouette. No acute osseous abnormality.  IMPRESSION: 1. Stable moderate left pleural effusion.   Electronically Signed   By: Kathreen Devoid   On: 08/08/2014 15:03     EKG Interpretation   Date/Time:  Thursday August 08 2014 13:55:16  EDT Ventricular Rate:  114 PR Interval:  127 QRS Duration: 81 QT Interval:  326 QTC Calculation: 449 R Axis:   42 Text Interpretation:  Sinus tachycardia Probable left atrial enlargement  Abnormal T, consider ischemia, diffuse leads Baseline wander in lead(s) V6  Confirmed by Hazle Coca [phone]) on 08/08/2014 3:09:01 PM      MDM   Final diagnoses:  Dyspnea  Tachycardia    Patient with metastatic lung cancer here for shortness of breath or tachycardia from radiation oncology. EKG was sinus tachycardia condition COPD exacerbation versus CHF. Patient care transferred pending laboratory analysis.     Quintella Reichert, MD 08/09/14 [phone]

## 2014-08-08 NOTE — Progress Notes (Unsigned)
Cataleya was brought from [facility] for her Radiosurgery treatment today. While trying to get her set up for her treatment, she became very short of breath. Her O2 stats were 98, but her blood pressure and heart rate very high. Rather than proceed with her treatment, she was taken to the ED with hopes that she be readmitted to a telemetry until to regulate her heart rate and help with her shortness of breath.   tachy 152 HR BP 175/105 O2 98 % RA        I called [facility] and spoke with Herbie Baltimore, the nurse caring for her. I informed him of what had happened and that their wheelchair is still here in our department. He replied that they would send someone out to pick it up.     Mont Dutton R.T.(R)(T) [facility]   Radiation Oncology  Radiation Special Procedures Navigator Office: [phone]   Pager: [phone]  [place] Website: [email or web address]

## 2014-08-08 NOTE — ED Notes (Signed)
Bed: WA07 Expected date:  Expected time:  Means of arrival:  Comments: Radiation onc pt, HR 152

## 2014-08-08 NOTE — Progress Notes (Signed)
ANTICOAGULATION CONSULT NOTE - Initial Consult  Pharmacy Consult for Lovenox Indication: hx DVT  No Known Allergies  Patient Measurements:     Vital Signs: Temp: 97.8 F (36.6 C) (03/17 1348) Temp Source: Oral (03/17 1348) BP: 131/81 mmHg (03/17 1348) Pulse Rate: 112 (03/17 1818)  Labs:  Recent Labs  08/06/14 0850 08/08/14 1531  HGB 10.1* 11.1*  HCT 29.9* 32.0*  PLT 201 149*  CREATININE 0.79 0.69  TROPONINI  --  <0.03    Estimated Creatinine Clearance: 68.3 mL/min (by C-G formula based on Cr of 0.69).   Medical History: Past Medical History  Diagnosis Date  . Hypertension   . Hyperlipidemia   . Knee injury 1983    left  . Lung cancer dx'd 03/2014  . Brain cancer   . Seizures     Medications:  Scheduled:  . carvedilol  6.25 mg Oral BID WC   Infusions:    Assessment: 74 yo with stage IV metastatic lung cancer currently receiving nivolumab with last dose given 2/26. Patient with recent admission just discharged 3/16 for treatment of HCAP treated with vancomycin/cefepime then transition to PO Levaquin with last dose supposed to be tomorrow 3/18 per previous admission. Patient also on full dose Lovenox 80mg  q12 for hx DVT with last dose this AM at 0900. Patient with chronic left sided pleural effusion Md to consult IR for possible therapeutic thoracentesis tomorrow 3/18 so plan to hold Lovenox tonight and evaluate when Lovenox can or if it can be restarted tomorrow post thoracentesis with pharmacy to manage. No reported bleeding per notes.  Goal of Therapy:  Anti-Xa level 0.6-1 units/ml 4hrs after LMWH dose given Monitor platelets by anticoagulation protocol: Yes   Plan:  1) Hold Lovenox for possible thoracentesis 3/18 2) Pharmacy will follow up when/if Lovenox needs to be restarted 3) Note that Lovenox order will not be active on patient's profile  Adrian Saran, PharmD, BCPS Pager [phone] 08/08/2014 6:31 PM

## 2014-08-08 NOTE — ED Provider Notes (Signed)
Emergency Ultrasound Study:   Angiocath insertion Performed by: Arbie Cookey  Consent: Verbal consent obtained. Risks and benefits: risks, benefits and alternatives were discussed Immediately prior to procedure the correct patient, procedure, equipment, support staff and site/side marked as needed.  Indication: difficult IV access Preparation: Patient was prepped and draped in the usual sterile fashion. Vein Location: Right brachial vein was visualized during assessment for potential access sites and was found to be patent/ easily compressed with linear ultrasound.  The needle was visualized with real-time ultrasound and guided into the vein. Gauge: 20g  Image saved and stored.  Normal blood return.  Patient tolerance: Patient tolerated the procedure well with no immediate complications.      Serita Grit, MD 08/08/14 [phone]

## 2014-08-09 ENCOUNTER — Inpatient Hospital Stay (HOSPITAL_COMMUNITY): Payer: Medicare Other

## 2014-08-09 ENCOUNTER — Encounter (HOSPITAL_COMMUNITY): Payer: Self-pay

## 2014-08-09 DIAGNOSIS — J90 Pleural effusion, not elsewhere classified: Secondary | ICD-10-CM

## 2014-08-09 DIAGNOSIS — R0600 Dyspnea, unspecified: Secondary | ICD-10-CM

## 2014-08-09 DIAGNOSIS — R000 Tachycardia, unspecified: Principal | ICD-10-CM

## 2014-08-09 DIAGNOSIS — C7931 Secondary malignant neoplasm of brain: Secondary | ICD-10-CM

## 2014-08-09 DIAGNOSIS — C7800 Secondary malignant neoplasm of unspecified lung: Secondary | ICD-10-CM

## 2014-08-09 DIAGNOSIS — C3432 Malignant neoplasm of lower lobe, left bronchus or lung: Secondary | ICD-10-CM

## 2014-08-09 LAB — BASIC METABOLIC PANEL
Anion gap: 11 (ref 5–15)
BUN: 16 mg/dL (ref 6–23)
CO2: 26 mmol/L (ref 19–32)
CREATININE: 0.71 mg/dL (ref 0.50–1.10)
Calcium: 8.8 mg/dL (ref 8.4–10.5)
Chloride: 107 mmol/L (ref 96–112)
GFR, EST NON AFRICAN AMERICAN: 84 mL/min — AB (ref >90)
Glucose, Bld: 96 mg/dL (ref 70–99)
Potassium: 4.2 mmol/L (ref 3.5–5.1)
Sodium: 144 mmol/L (ref 135–145)

## 2014-08-09 LAB — CBC
HCT: 30.2 % — ABNORMAL LOW (ref 36.0–46.0)
Hemoglobin: 10.4 g/dL — ABNORMAL LOW (ref 12.0–15.0)
MCH: 25.4 pg — AB (ref 26.0–34.0)
MCHC: 34.4 g/dL (ref 30.0–36.0)
MCV: 73.7 fL — ABNORMAL LOW (ref 78.0–100.0)
PLATELETS: 174 10*3/uL (ref 150–400)
RBC: 4.1 MIL/uL (ref 3.87–5.11)
RDW: 18.3 % — ABNORMAL HIGH (ref 11.5–15.5)
WBC: 13 10*3/uL — ABNORMAL HIGH (ref 4.0–10.5)

## 2014-08-09 LAB — MRSA PCR SCREENING: MRSA by PCR: NEGATIVE

## 2014-08-09 MED ORDER — ENOXAPARIN SODIUM 80 MG/0.8ML ~~LOC~~ SOLN
80.0000 mg | Freq: Two times a day (BID) | SUBCUTANEOUS | Status: DC
  Administered 2014-08-09 – 2014-08-13 (×8): 80 mg via SUBCUTANEOUS
  Filled 2014-08-09 (×8): qty 0.8

## 2014-08-09 NOTE — Progress Notes (Signed)
Michelle Cobb   DOB:03-25-1941   [id number]   TDV#:[id number]  Subjective: she was admitted for worsening dyspnea and tachycardia. Had thoracentesis this afternoon.    Objective:  Filed Vitals:   08/09/14 1709  BP: 121/87  Pulse: 101  Temp: 98.1 F (36.7 C)  Resp:     Body mass index is 27.39 kg/(m^2).  Intake/Output Summary (Last 24 hours) at 08/09/14 1732 Last data filed at 08/09/14 1059  Gross per 24 hour  Intake      0 ml  Output    750 ml  Net   -750 ml     Sclerae unicteric  Oropharynx clear  No peripheral adenopathy  Lungs: decreased breath sound on left lung base,  no rales or rhonchi  Heart regular rate and rhythm  Abdomen benign  MSK no focal spinal tenderness, no peripheral edema  Neuro nonfocal   CBG (last 3)   Recent Labs  08/07/14 0757  GLUCAP 151*     Labs:  Lab Results  Component Value Date   WBC 13.0* 08/09/2014   HGB 10.4* 08/09/2014   HCT 30.2* 08/09/2014   MCV 73.7* 08/09/2014   PLT 174 08/09/2014   NEUTROABS 14.5* 08/08/2014    @LASTCHEMISTRY @  Urine Studies No results for input(s): UHGB, CRYS in the last 72 hours.  Invalid input(s): UACOL, UAPR, USPG, UPH, UTP, UGL, UKET, UBIL, UNIT, UROB, [place], UEPI, UWBC, [place], [place]  Basic Metabolic Panel:  Recent Labs Lab 08/04/14 0415 08/05/14 0426 08/06/14 0850 08/08/14 1531 08/09/14 0516  NA 140 142 140 143 144  K 3.6 3.6 3.2* 4.3 4.2  CL 110 112 110 111 107  CO2 21 23 23 21 26   GLUCOSE 118* 122* 130* 79 96  BUN 14 16 19 16 16   CREATININE 0.69 0.79 0.79 0.69 0.71  CALCIUM 7.8* 8.3* 8.3* 8.4 8.8   GFR Estimated Creatinine Clearance: 69.1 mL/min (by C-G formula based on Cr of 0.71). Liver Function Tests:  Recent Labs Lab 08/08/14 1531  AST 18  ALT 15  ALKPHOS 40  BILITOT 1.0  PROT 6.1  ALBUMIN 2.4*   No results for input(s): LIPASE, AMYLASE in the last 168 hours. No results for input(s): AMMONIA in the last 168 hours. Coagulation profile No  results for input(s): INR, PROTIME in the last 168 hours.  CBC:  Recent Labs Lab 08/04/14 0415 08/05/14 0426 08/06/14 0850 08/08/14 1531 08/09/14 0516  WBC 16.8* 15.4* 14.6* 17.2* 13.0*  NEUTROABS  --   --   --  14.5*  --   HGB 8.4* 8.6* 10.1* 11.1* 10.4*  HCT 24.9* 25.5* 29.9* 32.0* 30.2*  MCV 74.1* 74.6* 74.0* 74.1* 73.7*  PLT 186 171 201 149* 174   Cardiac Enzymes:  Recent Labs Lab 08/08/14 1531  TROPONINI <0.03   BNP: Invalid input(s): POCBNP CBG:  Recent Labs Lab 08/03/14 0743 08/04/14 0734 08/05/14 0755 08/06/14 0746 08/07/14 0757  GLUCAP 106* 109* 106* 104* 151*   D-Dimer No results for input(s): DDIMER in the last 72 hours. Hgb A1c No results for input(s): HGBA1C in the last 72 hours. Lipid Profile No results for input(s): CHOL, HDL, LDLCALC, TRIG, CHOLHDL, LDLDIRECT in the last 72 hours. Thyroid function studies No results for input(s): TSH, T4TOTAL, T3FREE, THYROIDAB in the last 72 hours.  Invalid input(s): FREET3 Anemia work up No results for input(s): VITAMINB12, FOLATE, FERRITIN, TIBC, IRON, RETICCTPCT in the last 72 hours. Microbiology Recent Results (from the past 240 hour(s))  Blood Culture (routine  x 2)     Status: None   Collection Time: 08/01/14  6:54 PM  Result Value Ref Range Status   Specimen Description RIGHT ANTECUBITAL  Final   Special Requests BOTTLES DRAWN AEROBIC AND ANAEROBIC 5CC  Final   Culture   Final    NO GROWTH 5 DAYS Performed at [facility]    Report Status 08/08/2014 FINAL  Final  Blood Culture (routine x 2)     Status: None   Collection Time: 08/01/14  8:15 PM  Result Value Ref Range Status   Specimen Description BLOOD RIGHT HAND  Final   Special Requests BOTTLES DRAWN AEROBIC AND ANAEROBIC 4CC  Final   Culture   Final    NO GROWTH 5 DAYS Performed at [facility]    Report Status 08/08/2014 FINAL  Final  Respiratory virus panel     Status: None   Collection Time: 08/02/14 12:19 AM   Result Value Ref Range Status   Source - RVPAN NOSE  Corrected   Respiratory Syncytial Virus A Negative Negative Final   Respiratory Syncytial Virus B Negative Negative Final   Influenza A Negative Negative Final   Influenza B Negative Negative Final   Parainfluenza 1 Negative Negative Final   Parainfluenza 2 Negative Negative Final   Parainfluenza 3 Negative Negative Final   Metapneumovirus Negative Negative Final   Rhinovirus Negative Negative Final   Adenovirus Negative Negative Final    Comment: (NOTE) Performed At: [facility] [place], [place] [id number] Lindon Romp MD DZ:[id number]   Urine culture     Status: None   Collection Time: 08/03/14 10:26 PM  Result Value Ref Range Status   Specimen Description URINE, CLEAN CATCH  Final   Special Requests NONE  Final   Colony Count NO GROWTH Performed at [facility]   Final   Culture NO GROWTH Performed at [facility]   Final   Report Status 08/05/2014 FINAL  Final  Culture, sputum-assessment     Status: None   Collection Time: 08/05/14 10:25 AM  Result Value Ref Range Status   Specimen Description SPUTUM  Final   Special Requests NONE  Final   Sputum evaluation   Final    THIS SPECIMEN IS ACCEPTABLE. RESPIRATORY CULTURE REPORT TO FOLLOW.   Report Status 08/05/2014 FINAL  Final  Culture, respiratory (NON-Expectorated)     Status: None   Collection Time: 08/05/14 10:25 AM  Result Value Ref Range Status   Specimen Description SPUTUM  Final   Special Requests NONE  Final   Gram Stain   Final    ABUNDANT WBC PRESENT, PREDOMINANTLY PMN FEW SQUAMOUS EPITHELIAL CELLS PRESENT FEW GRAM POSITIVE COCCI IN PAIRS Performed at [facility]    Culture   Final    NORMAL OROPHARYNGEAL FLORA Performed at [facility]    Report Status 08/07/2014 FINAL  Final  MRSA PCR Screening     Status: None   Collection Time: 08/09/14 12:44 AM  Result Value Ref Range Status    MRSA by PCR NEGATIVE NEGATIVE Final    Comment:        The GeneXpert MRSA Assay (FDA approved for NASAL specimens only), is one component of a comprehensive MRSA colonization surveillance program. It is not intended to diagnose MRSA infection nor to guide or monitor treatment for MRSA infections.       Studies:  Dg Chest 1 View  08/09/2014   CLINICAL DATA:  Post left  thoracentesis  EXAM: CHEST  1 VIEW  COMPARISON:  08/08/2014  FINDINGS: Moderately large loculated left pleural effusion is similar to the prior study. Compressive atelectasis in the left lower lobe also unchanged. No pneumothorax post thoracentesis.  Right lung remains clear.  Negative for heart failure.  IMPRESSION: Negative for pneumothorax post left thoracentesis.   Electronically Signed   By: Franchot Gallo M.D.   On: 08/09/2014 17:12   Dg Chest Port 1 View  08/08/2014   CLINICAL DATA:  Shortness of breath before treatment.  EXAM: PORTABLE CHEST - 1 VIEW  COMPARISON:  08/01/2014  FINDINGS: Stable moderate left pleural effusion. There is likely associated mild compressive atelectasis. There is no other focal parenchymal opacity. There is no right pleural effusion. There is no pneumothorax. Stable cardiomediastinal silhouette. No acute osseous abnormality.  IMPRESSION: 1. Stable moderate left pleural effusion.   Electronically Signed   By: Kathreen Devoid   On: 08/08/2014 15:03    Assessment: 74 y.o. African-American female with past medical history of hypertension, dyslipidemia, was diagnosed stage IV lung adenocarcinoma in 03/2014.   1. 1. 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 on Nivo, received 1 dose. Hospitalized twice afterwards. 2. Left pleural effusion, cytology pending, likely malignant 3. Deconditioning due to disease progression 4. Brain metastasis, progressed recently, Dr. [name] planning to give SBRT   Plan:  -If her pleural effusion cytology  positive for malignant cells, I would prefer Pleurx placement. I discussed with patient she will think about it. She did have significant chest pain with thoracentesis today, may have some difficulty tolerating Pleurx -Her overall prognosis is very poor. Given her quick deconditioning lately, I recommend hospice and palliative care alone. She was seen by palliative care team today, she is more receptive now but not fully ready -Possible discharge to a nursing home.  -Discussed with the hospitalist Dr.Samtani Coralie Common, MD 08/09/2014  5:32 PM

## 2014-08-09 NOTE — Progress Notes (Signed)
Michelle Cobb WPV:[id number] DOB: [birth year] DOA: 08/08/2014 PCP: Woody Seller, MD  Brief narrative: 74 y/o ?  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  Past medical history-As per Problem list Chart reviewed as below- Reviewed  Consultants:  Oncology  Procedures:  nad  Antibiotics:  nad   Subjective   Well Just back from IR Wants to eat Patient understands that her lifespan limited "i want to fight" I explained to her her body probably will give out before her spriit does    Objective    Interim History:   Telemetry:    Objective: Filed Vitals:   08/09/14 1629 08/09/14 1635 08/09/14 1636 08/09/14 1709  BP: 134/88 135/79 115/59 121/87  Pulse:    101  Temp:    98.1 F (36.7 C)  TempSrc:    Oral  Resp:      Height:      Weight:      SpO2: 94% 96% 96% 95%    Intake/Output Summary (Last 24 hours) at 08/09/14 1842 Last data filed at 08/09/14 1818  Gross per 24 hour  Intake      0 ml  Output   1200 ml  Net  -1200 ml    Exam:  General: eomi, ncat Cardiovascular: s1 s2 no m/r/g Respiratory: clear no added sound Abdomen: soft, NT, ND Skin no LE edema Neuro intact  Data Reviewed: Basic Metabolic Panel:  Recent Labs Lab 08/04/14 0415 08/05/14 0426 08/06/14 0850 08/08/14 1531 08/09/14 0516  NA 140 142 140 143 144  K 3.6 3.6 3.2* 4.3 4.2  CL 110 112 110 111 107  CO2 21 23 23 21 26   GLUCOSE 118* 122* 130* 79 96  BUN 14 16 19 16 16   CREATININE 0.69 0.79 0.79 0.69 0.71  CALCIUM 7.8* 8.3* 8.3* 8.4 8.8   Liver Function Tests:  Recent Labs Lab 08/08/14 1531  AST 18  ALT 15  ALKPHOS 40  BILITOT 1.0  PROT 6.1  ALBUMIN 2.4*   No results for input(s): LIPASE, AMYLASE in the last 168 hours. No results for input(s): AMMONIA in the last 168 hours. CBC:  Recent Labs Lab 08/04/14 0415  08/05/14 0426 08/06/14 0850 08/08/14 1531 08/09/14 0516  WBC 16.8* 15.4* 14.6* 17.2* 13.0*  NEUTROABS  --   --   --  14.5*  --   HGB 8.4* 8.6* 10.1* 11.1* 10.4*  HCT 24.9* 25.5* 29.9* 32.0* 30.2*  MCV 74.1* 74.6* 74.0* 74.1* 73.7*  PLT 186 171 201 149* 174   Cardiac Enzymes:  Recent Labs Lab 08/08/14 1531  TROPONINI <0.03   BNP: Invalid input(s): POCBNP CBG:  Recent Labs Lab 08/03/14 0743 08/04/14 0734 08/05/14 0755 08/06/14 0746 08/07/14 0757  GLUCAP 106* 109* 106* 104* 151*    Recent Results (from the past 240 hour(s))  Blood Culture (routine x 2)     Status: None   Collection Time: 08/01/14  6:54 PM  Result Value Ref Range Status   Specimen Description RIGHT ANTECUBITAL  Final   Special Requests BOTTLES DRAWN AEROBIC AND ANAEROBIC 5CC  Final   Culture   Final    NO GROWTH 5 DAYS Performed at [facility]    Report Status 08/08/2014 FINAL  Final  Blood Culture (routine x 2)     Status: None   Collection Time: 08/01/14  8:15 PM  Result Value Ref Range Status   Specimen Description BLOOD RIGHT HAND  Final   Special Requests BOTTLES DRAWN AEROBIC AND ANAEROBIC 4CC  Final   Culture   Final    NO GROWTH 5 DAYS Performed at [facility]    Report Status 08/08/2014 FINAL  Final  Respiratory virus panel     Status: None   Collection Time: 08/02/14 12:19 AM  Result Value Ref Range Status   Source - RVPAN NOSE  Corrected   Respiratory Syncytial Virus A Negative Negative Final   Respiratory Syncytial Virus B Negative Negative Final   Influenza A Negative Negative Final   Influenza B Negative Negative Final   Parainfluenza 1 Negative Negative Final   Parainfluenza 2 Negative Negative Final   Parainfluenza 3 Negative Negative Final   Metapneumovirus Negative Negative Final   Rhinovirus Negative Negative Final   Adenovirus Negative Negative Final    Comment: (NOTE) Performed At: [facility] [place], [place]  [id number] Lindon Romp MD [id number]   Urine culture     Status: None   Collection Time: 08/03/14 10:26 PM  Result Value Ref Range Status   Specimen Description URINE, CLEAN CATCH  Final   Special Requests NONE  Final   Colony Count NO GROWTH Performed at [facility]   Final   Culture NO GROWTH Performed at [facility]   Final   Report Status 08/05/2014 FINAL  Final  Culture, sputum-assessment     Status: None   Collection Time: 08/05/14 10:25 AM  Result Value Ref Range Status   Specimen Description SPUTUM  Final   Special Requests NONE  Final   Sputum evaluation   Final    THIS SPECIMEN IS ACCEPTABLE. RESPIRATORY CULTURE REPORT TO FOLLOW.   Report Status 08/05/2014 FINAL  Final  Culture, respiratory (NON-Expectorated)     Status: None   Collection Time: 08/05/14 10:25 AM  Result Value Ref Range Status   Specimen Description SPUTUM  Final   Special Requests NONE  Final   Gram Stain   Final    ABUNDANT WBC PRESENT, PREDOMINANTLY PMN FEW SQUAMOUS EPITHELIAL CELLS PRESENT FEW GRAM POSITIVE COCCI IN PAIRS Performed at [facility]    Culture   Final    NORMAL OROPHARYNGEAL FLORA Performed at [facility]    Report Status 08/07/2014 FINAL  Final  MRSA PCR Screening     Status: None   Collection Time: 08/09/14 12:44 AM  Result Value Ref Range Status   MRSA by PCR NEGATIVE NEGATIVE Final    Comment:        The GeneXpert MRSA Assay (FDA approved for NASAL specimens only), is one component of a comprehensive MRSA colonization surveillance program. It is not intended to diagnose MRSA infection nor to guide or monitor treatment for MRSA infections.      Studies:              All Imaging reviewed and is as per above notation   Scheduled Meds: . atorvastatin  10 mg Oral QHS  . carvedilol  6.25 mg Oral BID WC  . cholecalciferol  1,000 Units Oral Daily  . [START ON 08/18/2014] dexamethasone  2 mg Oral Q12H  . [START ON  08/23/2014] dexamethasone  2 mg Oral Daily  . dexamethasone  4 mg Oral TID  . [START ON 08/11/2014] dexamethasone  4 mg Oral Q12H  . dronabinol  2.5 mg Oral BID AC  . enoxaparin (LOVENOX) injection  80 mg Subcutaneous Q12H  . feeding supplement (ENSURE COMPLETE)  237 mL Oral BID BM  . ferrous sulfate  325 mg Oral Q breakfast  . folic acid  1 mg Oral Daily  . hydrochlorothiazide  25 mg Oral Daily  . levETIRAcetam  1,000 mg Oral BID  . mirtazapine  15 mg Oral QHS  . polyethylene glycol  17 g Oral Daily  . potassium chloride SA  20 mEq Oral BID  . sodium chloride  3 mL Intravenous Q12H   Continuous Infusions:    Assessment/Plan:  Tachycardia- patient has a chronic sinus tachycardia. not on any PTCA beta blocker.  I suspect her tachycardia is related to her stage IV cancer.  =/- pleural effusion  She does not appear volume depleted but does have third spacing because of her pleural effusion Continue Coreg 6. 25 twice a day Another element of her tachycardia could be her anxiety because of her diagnosis. She will need holistic therapies with this. See below.   DVT (deep venous thrombosis)-the patient is on therapeutic doses of Lovenox.  This may need to be held in case we decide on doing a therapeutic thoracocentesis. I will speak to pharmacy to delineate this. Lovenox resumed   Chronic left-sided pleural effusion-appeciate input from interventional radiology-600 cc drained.  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 Patient also noted to be on therapeutic doses of Lovenox. Monitor stools for darkness   Recent possible pneumonia-on Levaquin and Flagyl since last admission no fever, d/c 3/20    Malignant neoplasm of lower lobe of left lung-Stg IV with Brain Metastases on Decadron--she has stage IV disease and intent of her chemotherapy is palliative. Her performance scale/score is good but with progression of her disease and  repeated issues, we will need potentially palliative care to be involved in her further care--ptient refused Pallaitive input 08/09/14 Dr. Burr Medico will d/c Rad-ONC re further plans   Hypoxia    Hypertension-continue HCTZ 25 mg daily. In addition I will start low-dose Coreg as above  Code Status: full  Family Communication: none bedsdie Disposition Plan:  inpatient   Verneita Griffes, MD  [facility] Pager [phone] 08/09/2014, 6:42 PM    LOS: 1 day

## 2014-08-09 NOTE — Progress Notes (Signed)
Reviewed records for Michelle Cobb and attempted to see today.  She questioned why palliative care was here to see ehr and I said my main goal was to support her through her illness and help address complex issues she was facing. She responded with "there is nothing you can do for me".  She admits to having issues such as pain, but does not want me to help address.  Calia appears clearly frustrated from her repeat hospitalization.  While she was dismissive of me (maybe she has preconceived notions of palliative care?), she was not angry or particularly annoyed at me seeing her.  She requested to not have me see her today but agreed that I could check back with her tomorrow.  I will see if she is more open to consultation tomorrow.   Doran Clay D.O. Palliative Medicine Team at [facility]  Pager: [phone] Team Phone: [phone]

## 2014-08-09 NOTE — Procedures (Signed)
US guided diagnostic/therapeutic left thoracentesis performed yielding 620 cc's turbid, blood-tinged fluid. The fluid was sent to the lab for preordered studies. Only the above amount of fluid was removed secondary to chest discomfort and increased coughing. F/u CXR pending. No immediate complications.

## 2014-08-09 NOTE — Progress Notes (Signed)
Clinical Social Work Department BRIEF PSYCHOSOCIAL ASSESSMENT 08/09/2014  Patient:  Michelle Cobb, Michelle Cobb     Account Number:  [id number]     Admit date:  08/08/2014  Clinical Social Worker:  Renold Genta  Date/Time:  08/09/2014 03:43 PM  Referred by:  Physician  Date Referred:  08/09/2014 Referred for  Other - See comment   Other Referral:   Admitted from: [place] type:  Patient Other interview type:   and cousin, [place] via phone    PSYCHOSOCIAL DATA Living Status:  FACILITY Admitted from facility:  [place], [place] of care:  [place] Primary support name:  [place] (cousin) ph#: [phone] Primary support relationship to patient:  FAMILY Degree of support available:   good    CURRENT CONCERNS Current Concerns  Post-Acute Placement   Other Concerns:    SOCIAL WORK ASSESSMENT / PLAN CSW received consult that patient was admitted from [facility].   Assessment/plan status:  Information/Referral to [employer] Other assessment/ plan:   Information/referral to community resources:   CSW completed FL2 and confirmed with Tammy @ [facility] that they would be able to take patient back when ready.    PATIENT'S/FAMILY'S RESPONSE TO PLAN OF CARE: Patient states that she plans to return to [facility] and was anxious about losing her room. CSW assured her that they would hold that room for her as long as possible.          Raynaldo Opitz, [facility] Clinical Social Worker cell #: [phone]

## 2014-08-10 MED ORDER — TRAMADOL 5 MG/ML ORAL SUSPENSION: 25.0000 mg | Freq: Four times a day (QID) | ORAL | Status: DC | PRN

## 2014-08-10 MED ORDER — TRAMADOL HCL 50 MG PO TABS: 25.0000 mg | ORAL_TABLET | Freq: Four times a day (QID) | ORAL | Status: DC | PRN

## 2014-08-10 MED ORDER — FUROSEMIDE 20 MG PO TABS
20.0000 mg | ORAL_TABLET | Freq: Every day | ORAL | Status: DC
  Administered 2014-08-10 – 2014-08-12 (×3): 20 mg via ORAL
  Filled 2014-08-10 (×3): qty 1

## 2014-08-10 NOTE — Progress Notes (Signed)
ANTICOAGULATION CONSULT NOTE - Follow-up Consult  Pharmacy Consult for Lovenox Indication: hx DVT  No Known Allergies  Patient Measurements: Height: 5' 7.5" (171.5 cm) Weight: 177 lb 9.6 oz (80.559 kg) IBW/kg (Calculated) : 62.75   Vital Signs: Temp: 97.9 F (36.6 C) (03/19 0518) Temp Source: Oral (03/19 0518) BP: 124/80 mmHg (03/19 0911) Pulse Rate: 100 (03/19 0911)  Labs:  Recent Labs  08/08/14 1531 08/09/14 0516  HGB 11.1* 10.4*  HCT 32.0* 30.2*  PLT 149* 174  CREATININE 0.69 0.71  TROPONINI <0.03  --     Estimated Creatinine Clearance: 69.1 mL/min (by C-G formula based on Cr of 0.71).   Medical History: Past Medical History  Diagnosis Date  . Hypertension   . Hyperlipidemia   . Knee injury 1983    left  . Lung cancer dx'd 03/2014  . Brain cancer   . Seizures     Medications:  Scheduled:  . atorvastatin  10 mg Oral QHS  . carvedilol  6.25 mg Oral BID WC  . cholecalciferol  1,000 Units Oral Daily  . [START ON 08/18/2014] dexamethasone  2 mg Oral Q12H  . [START ON 08/23/2014] dexamethasone  2 mg Oral Daily  . dexamethasone  4 mg Oral TID  . [START ON 08/11/2014] dexamethasone  4 mg Oral Q12H  . dronabinol  2.5 mg Oral BID AC  . enoxaparin (LOVENOX) injection  80 mg Subcutaneous Q12H  . feeding supplement (ENSURE COMPLETE)  237 mL Oral BID BM  . ferrous sulfate  325 mg Oral Q breakfast  . folic acid  1 mg Oral Daily  . hydrochlorothiazide  25 mg Oral Daily  . levETIRAcetam  1,000 mg Oral BID  . mirtazapine  15 mg Oral QHS  . polyethylene glycol  17 g Oral Daily  . potassium chloride SA  20 mEq Oral BID  . sodium chloride  3 mL Intravenous Q12H   Infusions:    Assessment: 74 yo with stage IV metastatic lung cancer currently receiving nivolumab with last dose given 2/26. Patient with recent admission just discharged 3/16 for treatment of HCAP treated with vancomycin/cefepime then transition to PO Levaquin with last dose supposed to be tomorrow 3/18  per previous admission. Patient also on full dose Lovenox 80mg  q12 for hx DVT with last dose this AM at 0900. Patient with chronic left sided pleural effusion Md to consult IR for possible therapeutic thoracentesis tomorrow 3/18 so plan to hold Lovenox tonight and evaluate when Lovenox can or if it can be restarted tomorrow post thoracentesis with pharmacy to manage. No reported bleeding per notes.  3/19: Lovenox resumed last night after thoracentesis.  No issues noted with procedure or CBC/renal function. No new weight.     Goal of Therapy:  Anti-Xa level 0.6-1 units/ml 4hrs after LMWH dose given Monitor platelets by anticoagulation protocol: Yes   Plan:  Continue lovenox 1mg /kg q12h = 80mg  q12h F/u renal fxn and CBC   Ralene Bathe, PharmD, BCPS 08/10/2014, 2:09 PM  Pager: [phone]

## 2014-08-10 NOTE — Progress Notes (Signed)
Pt refused to walk this evening due to be upset about not being at [facility]. Attempted to communicate with patient need for walk and move to chair, but patient steadfastly refused. Pt stated if we left her alone for now she would ambulate and move to the chair in the morning. Pt was agreeable to placing the bed in chair position and stated her comfort. Will continue to monitor.

## 2014-08-10 NOTE — Progress Notes (Signed)
Michelle Cobb IHW:[id number] DOB: 09-04-1940 DOA: 08/08/2014 PCP: Woody Seller, MD  Brief narrative: 74 y/o ?  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  Past medical history-As per Problem list Chart reviewed as below- Reviewed  Consultants:  Oncology  Procedures:  nad  Antibiotics:  nad   Subjective   Fair SOB this am-not reported to MD No n/v Eating ok No stool NO diarr Usually in Wheelchair ~ 2 mo Wants to move around No rash   Objective    Interim History:   Telemetry:    Objective: Filed Vitals:   08/09/14 2053 08/10/14 0518 08/10/14 0911 08/10/14 1436  BP: 99/68 123/80 124/80 106/68  Pulse: 88 90 100 77  Temp: 98.2 F (36.8 C) 97.9 F (36.6 C)  97.9 F (36.6 C)  TempSrc: Oral Oral  Oral  Resp: 18 19  18   Height:      Weight:      SpO2: 98% 100%  99%   No intake or output data in the 24 hours ending 08/10/14 1827  Exam:  General: eomi, ncat Cardiovascular: s1 s2 no m/r/g Respiratory: clear no added sound Abdomen: soft, NT, ND Skin grade 1 LE edema Neuro intact  Data Reviewed: Basic Metabolic Panel:  Recent Labs Lab 08/04/14 0415 08/05/14 0426 08/06/14 0850 08/08/14 1531 08/09/14 0516  NA 140 142 140 143 144  K 3.6 3.6 3.2* 4.3 4.2  CL 110 112 110 111 107  CO2 21 23 23 21 26   GLUCOSE 118* 122* 130* 79 96  BUN 14 16 19 16 16   CREATININE 0.69 0.79 0.79 0.69 0.71  CALCIUM 7.8* 8.3* 8.3* 8.4 8.8   Liver Function Tests:  Recent Labs Lab 08/08/14 1531  AST 18  ALT 15  ALKPHOS 40  BILITOT 1.0  PROT 6.1  ALBUMIN 2.4*   No results for input(s): LIPASE, AMYLASE in the last 168 hours. No results for input(s): AMMONIA in the last 168 hours. CBC:  Recent Labs Lab 08/04/14 0415 08/05/14 0426 08/06/14 0850 08/08/14 1531 08/09/14 0516  WBC 16.8* 15.4* 14.6* 17.2* 13.0*    NEUTROABS  --   --   --  14.5*  --   HGB 8.4* 8.6* 10.1* 11.1* 10.4*  HCT 24.9* 25.5* 29.9* 32.0* 30.2*  MCV 74.1* 74.6* 74.0* 74.1* 73.7*  PLT 186 171 201 149* 174   Cardiac Enzymes:  Recent Labs Lab 08/08/14 1531  TROPONINI <0.03   BNP: Invalid input(s): POCBNP CBG:  Recent Labs Lab 08/04/14 0734 08/05/14 0755 08/06/14 0746 08/07/14 0757  GLUCAP 109* 106* 104* 151*    Recent Results (from the past 240 hour(s))  Blood Culture (routine x 2)     Status: None   Collection Time: 08/01/14  6:54 PM  Result Value Ref Range Status   Specimen Description RIGHT ANTECUBITAL  Final   Special Requests BOTTLES DRAWN AEROBIC AND ANAEROBIC 5CC  Final   Culture   Final    NO GROWTH 5 DAYS Performed at [facility]    Report Status 08/08/2014 FINAL  Final  Blood Culture (routine x 2)     Status: None   Collection Time: 08/01/14  8:15 PM  Result Value Ref Range Status   Specimen Description BLOOD RIGHT HAND  Final   Special Requests BOTTLES DRAWN AEROBIC AND ANAEROBIC 4CC  Final   Culture  Final    NO GROWTH 5 DAYS Performed at [facility]    Report Status 08/08/2014 FINAL  Final  Respiratory virus panel     Status: None   Collection Time: 08/02/14 12:19 AM  Result Value Ref Range Status   Source - RVPAN NOSE  Corrected   Respiratory Syncytial Virus A Negative Negative Final   Respiratory Syncytial Virus B Negative Negative Final   Influenza A Negative Negative Final   Influenza B Negative Negative Final   Parainfluenza 1 Negative Negative Final   Parainfluenza 2 Negative Negative Final   Parainfluenza 3 Negative Negative Final   Metapneumovirus Negative Negative Final   Rhinovirus Negative Negative Final   Adenovirus Negative Negative Final    Comment: (NOTE) Performed At: [facility] [place], [place] [id number] Lindon Romp MD RF:[id number]   Urine culture     Status: None   Collection Time: 08/03/14 10:26 PM  Result  Value Ref Range Status   Specimen Description URINE, CLEAN CATCH  Final   Special Requests NONE  Final   Colony Count NO GROWTH Performed at [facility]   Final   Culture NO GROWTH Performed at [facility]   Final   Report Status 08/05/2014 FINAL  Final  Culture, sputum-assessment     Status: None   Collection Time: 08/05/14 10:25 AM  Result Value Ref Range Status   Specimen Description SPUTUM  Final   Special Requests NONE  Final   Sputum evaluation   Final    THIS SPECIMEN IS ACCEPTABLE. RESPIRATORY CULTURE REPORT TO FOLLOW.   Report Status 08/05/2014 FINAL  Final  Culture, respiratory (NON-Expectorated)     Status: None   Collection Time: 08/05/14 10:25 AM  Result Value Ref Range Status   Specimen Description SPUTUM  Final   Special Requests NONE  Final   Gram Stain   Final    ABUNDANT WBC PRESENT, PREDOMINANTLY PMN FEW SQUAMOUS EPITHELIAL CELLS PRESENT FEW GRAM POSITIVE COCCI IN PAIRS Performed at [facility]    Culture   Final    NORMAL OROPHARYNGEAL FLORA Performed at [facility]    Report Status 08/07/2014 FINAL  Final  MRSA PCR Screening     Status: None   Collection Time: 08/09/14 12:44 AM  Result Value Ref Range Status   MRSA by PCR NEGATIVE NEGATIVE Final    Comment:        The GeneXpert MRSA Assay (FDA approved for NASAL specimens only), is one component of a comprehensive MRSA colonization surveillance program. It is not intended to diagnose MRSA infection nor to guide or monitor treatment for MRSA infections.   Body fluid culture     Status: None (Preliminary result)   Collection Time: 08/09/14  4:46 PM  Result Value Ref Range Status   Specimen Description Pleural, L  Final   Special Requests Normal  Final   Gram Stain   Final    WBC PRESENT, PREDOMINANTLY MONONUCLEAR NO ORGANISMS SEEN Performed at [facility]    Culture NO GROWTH Performed at [facility]   Final   Report Status PENDING   Incomplete     Studies:              All Imaging reviewed and is as per above notation   Scheduled Meds: . atorvastatin  10 mg Oral QHS  . carvedilol  6.25 mg Oral BID WC  . cholecalciferol  1,000 Units Oral Daily  . [  START ON 08/18/2014] dexamethasone  2 mg Oral Q12H  . [START ON 08/23/2014] dexamethasone  2 mg Oral Daily  . dexamethasone  4 mg Oral TID  . [START ON 08/11/2014] dexamethasone  4 mg Oral Q12H  . dronabinol  2.5 mg Oral BID AC  . enoxaparin (LOVENOX) injection  80 mg Subcutaneous Q12H  . feeding supplement (ENSURE COMPLETE)  237 mL Oral BID BM  . ferrous sulfate  325 mg Oral Q breakfast  . folic acid  1 mg Oral Daily  . hydrochlorothiazide  25 mg Oral Daily  . levETIRAcetam  1,000 mg Oral BID  . mirtazapine  15 mg Oral QHS  . polyethylene glycol  17 g Oral Daily  . potassium chloride SA  20 mEq Oral BID  . sodium chloride  3 mL Intravenous Q12H   Continuous Infusions:    Assessment/Plan:  Tachycardia- patient has a chronic sinus tachycardia. not on any PTCA beta blocker.  I suspect her tachycardia is related to her stage IV cancer.  =/- pleural effusion  She does not appear volume depleted but does have third spacing because of her pleural effusion Continue Coreg 6. 25 twice a day Another element of her tachycardia could be her anxiety because of her diagnosis.  She will need holistic therapies with this. See below.   DVT (deep venous thrombosis)-the patient is on therapeutic doses of Lovenox. Lovenox resumed full dose s/p thoracocentesis   Chronic left-sided pleural effusion- appeciate input from interventional radiology-600 cc drained 3/19.   Await Cytology/culture She has lower extremity swelling and I will start PO lasix 3/20   Anemia of chronic disease-likely of malignancy- consider continuing FeSO4.  Patient also noted to be on therapeutic doses of Lovenox.  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  Her performance scale/score is good but with progression of her disease and repeated issues, we will need potentially palliative care to be involved in her further care --patient unwilling to face r/scared to face Life limiting illness in settign of good Ecog status Appreciate Palliative attempts to engage her Dr. Burr Medico will d/c Rad-ONC re further plans   Hypoxia-ambulate today with ambulatory sats.  Will need to return to [facility] when ready   Hypertension-continue HCTZ 25 mg daily.    On Low-dose Coreg as above   Code Status: full  Family Communication: none bedsdie Disposition Plan:  inpatient   Verneita Griffes, MD  [facility] Pager [phone] 08/10/2014, 6:27 PM    LOS: 2 days

## 2014-08-10 NOTE — Progress Notes (Signed)
"  Michelle Cobb" allows me to talk with her slightly more, but not a significant amount. She actively deflects talking about anything related to her cancer.  She states that Dr's keep asking too many questions of her.  She has some friends with her today who seem very supportive and concerned. Michelle Cobb wont open up to them or let them hear what is going on. Very difficult situation going forward given her reluctance to address issues with her cancer going forward. She is a very independent person and I sense frustrated by care requirements to treat her cancer, but she wont talk about how to handle it.  I suspect at some point she is at high risk for re-admission, and I would request that we be re-consulted and attempt to build relationship with her so these important discussions hopefully can occur.   Doran Clay D.O. Palliative Medicine Team at [facility]  Pager: [phone] Team Phone: [phone]

## 2014-08-11 ENCOUNTER — Inpatient Hospital Stay (HOSPITAL_COMMUNITY): Payer: Medicare Other

## 2014-08-11 LAB — COMPREHENSIVE METABOLIC PANEL
ALT: 16 U/L (ref 0–35)
AST: 14 U/L (ref 0–37)
Albumin: 2.5 g/dL — ABNORMAL LOW (ref 3.5–5.2)
Alkaline Phosphatase: 35 U/L — ABNORMAL LOW (ref 39–117)
Anion gap: 12 (ref 5–15)
BUN: 25 mg/dL — AB (ref 6–23)
CALCIUM: 8.3 mg/dL — AB (ref 8.4–10.5)
CO2: 25 mmol/L (ref 19–32)
Chloride: 104 mmol/L (ref 96–112)
Creatinine, Ser: 0.67 mg/dL (ref 0.50–1.10)
GFR calc Af Amer: >90 mL/min (ref >90)
GFR calc non Af Amer: 85 mL/min — ABNORMAL LOW (ref >90)
Glucose, Bld: 113 mg/dL — ABNORMAL HIGH (ref 70–99)
POTASSIUM: 4.6 mmol/L (ref 3.5–5.1)
SODIUM: 141 mmol/L (ref 135–145)
TOTAL PROTEIN: 5.9 g/dL — AB (ref 6.0–8.3)
Total Bilirubin: 0.9 mg/dL (ref 0.3–1.2)

## 2014-08-11 LAB — CBC WITH DIFFERENTIAL/PLATELET
Basophils Absolute: 0 10*3/uL (ref 0.0–0.1)
Basophils Relative: 0 % (ref 0–1)
Eosinophils Absolute: 0 10*3/uL (ref 0.0–0.7)
Eosinophils Relative: 0 % (ref 0–5)
HCT: 30 % — ABNORMAL LOW (ref 36.0–46.0)
Hemoglobin: 10.3 g/dL — ABNORMAL LOW (ref 12.0–15.0)
Lymphocytes Relative: 8 % — ABNORMAL LOW (ref 12–46)
Lymphs Abs: 1.2 10*3/uL (ref 0.7–4.0)
MCH: 25.7 pg — ABNORMAL LOW (ref 26.0–34.0)
MCHC: 34.3 g/dL (ref 30.0–36.0)
MCV: 74.8 fL — ABNORMAL LOW (ref 78.0–100.0)
Monocytes Absolute: 0.3 10*3/uL (ref 0.1–1.0)
Monocytes Relative: 2 % — ABNORMAL LOW (ref 3–12)
NEUTROS ABS: 12.9 10*3/uL — AB (ref 1.7–7.7)
Neutrophils Relative %: 90 % — ABNORMAL HIGH (ref 43–77)
Platelets: 148 10*3/uL — ABNORMAL LOW (ref 150–400)
RBC: 4.01 MIL/uL (ref 3.87–5.11)
RDW: 18.4 % — ABNORMAL HIGH (ref 11.5–15.5)
WBC: 14.3 10*3/uL — AB (ref 4.0–10.5)

## 2014-08-11 NOTE — Progress Notes (Signed)
Michelle Cobb BMW:[id number] DOB: Oct 14, 1940 DOA: 08/08/2014 PCP: Woody Seller, MD  Brief narrative: 74 y/o ?  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 Admitted to [facility] 3/17 with episodic tachyarrhythmia while getting ready for XRT at Radiation-Oncology suite No acute findings-Had Thoracocentesis 3/18 yielding 620 cc fluid  Past medical history-As per Problem list Chart reviewed as below- Reviewed  Consultants:  Oncology  Procedures:  nad  Antibiotics:  nad   Subjective    Doing okay, some chest pain Some lower extremity swelling No nausea no vomiting Shortness of breath seems to be at baseline No chills no rigor    Objective    Interim History:   Telemetry:    Objective: Filed Vitals:   08/10/14 1436 08/10/14 2117 08/11/14 0520 08/11/14 1511  BP: 106/68 107/63 108/64 112/68  Pulse: 77 74 71 75  Temp: 97.9 F (36.6 C) 97.7 F (36.5 C)  97.8 F (36.6 C)  TempSrc: Oral Oral  Oral  Resp: _0 Height:      Weight:      SpO2: 99% 95% 96% 96%    Intake/Output Summary (Last 24 hours) at 08/11/14 1627 Last data filed at 08/11/14 0600  Gross per 24 hour  Intake      0 ml  Output    700 ml  Net   -700 ml    Exam:  General: eomi, ncat Cardiovascular: s1 s2 no m/r/g Respiratory: clear no added sound Abdomen: soft, NT, ND Skin grade 1 LE edema Neuro intact  Data Reviewed: Basic Metabolic Panel:  Recent Labs Lab 08/05/14 0426 08/06/14 0850 08/08/14 1531 08/09/14 0516 08/11/14 0532  NA 142 140 143 144 141  K 3.6 3.2* 4.3 4.2 4.6  CL 112 110 111 107 104  CO2 _1 GLUCOSE 122* 130* 79 96 113*  BUN _2 25*  CREATININE 0.79 0.79 0.69 0.71 0.67  CALCIUM 8.3* 8.3* 8.4 8.8 8.3*   Liver Function Tests:  Recent Labs Lab 08/08/14 1531 08/11/14 0532  AST 18 14  ALT 15  16  ALKPHOS 40 35*  BILITOT 1.0 0.9  PROT 6.1 5.9*  ALBUMIN 2.4* 2.5*   No results for input(s): LIPASE, AMYLASE in the last 168 hours. No results for input(s): AMMONIA in the last 168 hours. CBC:  Recent Labs Lab 08/05/14 0426 08/06/14 0850 08/08/14 1531 08/09/14 0516 08/11/14 0532  WBC 15.4* 14.6* 17.2* 13.0* 14.3*  NEUTROABS  --   --  14.5*  --  12.9*  HGB 8.6* 10.1* 11.1* 10.4* 10.3*  HCT 25.5* 29.9* 32.0* 30.2* 30.0*  MCV 74.6* 74.0* 74.1* 73.7* 74.8*  PLT 171 201 149* 174 148*   Cardiac Enzymes:  Recent Labs Lab 08/08/14 1531  TROPONINI <0.03   BNP: Invalid input(s): POCBNP CBG:  Recent Labs Lab 08/05/14 0755 08/06/14 0746 08/07/14 0757  GLUCAP 106* 104* 151*    Recent Results (from the past 240 hour(s))  Blood Culture (routine x 2)     Status: None   Collection Time: 08/01/14  6:54 PM  Result Value Ref Range Status   Specimen Description RIGHT ANTECUBITAL  Final   Special Requests BOTTLES DRAWN AEROBIC AND ANAEROBIC 5CC  Final   Culture   Final    NO GROWTH 5 DAYS Performed at [facility]  Report Status 08/08/2014 FINAL  Final  Blood Culture (routine x 2)     Status: None   Collection Time: 08/01/14  8:15 PM  Result Value Ref Range Status   Specimen Description BLOOD RIGHT HAND  Final   Special Requests BOTTLES DRAWN AEROBIC AND ANAEROBIC 4CC  Final   Culture   Final    NO GROWTH 5 DAYS Performed at [facility]    Report Status 08/08/2014 FINAL  Final  Respiratory virus panel     Status: None   Collection Time: 08/02/14 12:19 AM  Result Value Ref Range Status   Source - RVPAN NOSE  Corrected   Respiratory Syncytial Virus A Negative Negative Final   Respiratory Syncytial Virus B Negative Negative Final   Influenza A Negative Negative Final   Influenza B Negative Negative Final   Parainfluenza 1 Negative Negative Final   Parainfluenza 2 Negative Negative Final   Parainfluenza 3 Negative Negative Final    Metapneumovirus Negative Negative Final   Rhinovirus Negative Negative Final   Adenovirus Negative Negative Final    Comment: (NOTE) Performed At: [facility] [place], [place] [id number] Lindon Romp MD [id number]   Urine culture     Status: None   Collection Time: 08/03/14 10:26 PM  Result Value Ref Range Status   Specimen Description URINE, CLEAN CATCH  Final   Special Requests NONE  Final   Colony Count NO GROWTH Performed at [facility]   Final   Culture NO GROWTH Performed at [facility]   Final   Report Status 08/05/2014 FINAL  Final  Culture, sputum-assessment     Status: None   Collection Time: 08/05/14 10:25 AM  Result Value Ref Range Status   Specimen Description SPUTUM  Final   Special Requests NONE  Final   Sputum evaluation   Final    THIS SPECIMEN IS ACCEPTABLE. RESPIRATORY CULTURE REPORT TO FOLLOW.   Report Status 08/05/2014 FINAL  Final  Culture, respiratory (NON-Expectorated)     Status: None   Collection Time: 08/05/14 10:25 AM  Result Value Ref Range Status   Specimen Description SPUTUM  Final   Special Requests NONE  Final   Gram Stain   Final    ABUNDANT WBC PRESENT, PREDOMINANTLY PMN FEW SQUAMOUS EPITHELIAL CELLS PRESENT FEW GRAM POSITIVE COCCI IN PAIRS Performed at [facility]    Culture   Final    NORMAL OROPHARYNGEAL FLORA Performed at [facility]    Report Status 08/07/2014 FINAL  Final  MRSA PCR Screening     Status: None   Collection Time: 08/09/14 12:44 AM  Result Value Ref Range Status   MRSA by PCR NEGATIVE NEGATIVE Final    Comment:        The GeneXpert MRSA Assay (FDA approved for NASAL specimens only), is one component of a comprehensive MRSA colonization surveillance program. It is not intended to diagnose MRSA infection nor to guide or monitor treatment for MRSA infections.   Body fluid culture     Status: None (Preliminary result)   Collection Time:  08/09/14  4:46 PM  Result Value Ref Range Status   Specimen Description Pleural, L  Final   Special Requests Normal  Final   Gram Stain   Final    WBC PRESENT, PREDOMINANTLY MONONUCLEAR NO ORGANISMS SEEN Performed at [facility]    Culture   Final    NO GROWTH 1 DAY Performed at [facility]  Report Status PENDING  Incomplete     Studies:              All Imaging reviewed and is as per above notation   Scheduled Meds: . carvedilol  6.25 mg Oral BID WC  . cholecalciferol  1,000 Units Oral Daily  . [START ON 08/18/2014] dexamethasone  2 mg Oral Q12H  . [START ON 08/23/2014] dexamethasone  2 mg Oral Daily  . dexamethasone  4 mg Oral Q12H  . dronabinol  2.5 mg Oral BID AC  . enoxaparin (LOVENOX) injection  80 mg Subcutaneous Q12H  . feeding supplement (ENSURE COMPLETE)  237 mL Oral BID BM  . ferrous sulfate  325 mg Oral Q breakfast  . folic acid  1 mg Oral Daily  . furosemide  20 mg Oral Daily  . hydrochlorothiazide  25 mg Oral Daily  . levETIRAcetam  1,000 mg Oral BID  . mirtazapine  15 mg Oral QHS  . potassium chloride SA  20 mEq Oral BID  . sodium chloride  3 mL Intravenous Q12H   Continuous Infusions:    Assessment/Plan:  Tachycardia- patient has a chronic sinus tachycardia. not on any PTCA beta blocker.  I suspect her tachycardia is related to her stage IV cancer.  =/- pleural effusion  She does not appear volume depleted but does have third spacing because of her pleural effusion Continue Coreg 6. 25 twice a day Another element of her tachycardia could be her anxiety because of her diagnosis.     DVT (deep venous thrombosis)- the patient is on therapeutic doses of Lovenox. Lovenox resumed full dose s/p thoracocentesis   Chronic left-sided pleural effusion- appeciate input from interventional radiology-600 cc drained 3/19.   Await Cytology/culture She has lower extremity swelling and I will start PO lasix 3/20 She is continuing to have  chest pain so I do think that moderate doses of Roxanol may be in order and I will address this with her in the next 1-2 days   Anemia of chronic disease-likely of malignancy- consider continuing FeSO4.  Patient also noted to be on therapeutic doses of Lovenox.  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  Her performance scale/score is good but with progression of her disease and repeated issues, we will need potentially palliative care to be involved in her further care --patient unwilling to face /scared to face Life limiting illness in settign of good Ecog status Appreciate Palliative attempts to engage her Dr. Burr Medico will d/c Rad-ONC re further plans I'm not clear what end point this patient has. I do think that further discussion as needed with her but that if she remains clinically stable over the next 1-2 days and no significant reaccumulation of her fluid occurs on chest x-ray on 3/21, we can discharge her home. If there is significant fluid reaccumulation, I will discuss with the patient placing a Pleurx drain.  She is very hesitant and adamant that she does not want to have further interventions done as they were painful   Patient nonambulatory and likes to use a wheelchair. He  will monitor   Hypertension-continue HCTZ 25 mg daily.    On Low-dose Coreg as above   Code Status: full  Family Communication: none bedsdie Disposition Plan:  inpatient   Verneita Griffes, MD  [facility] Pager [phone] 08/11/2014, 4:27 PM    LOS: 3 days

## 2014-08-12 ENCOUNTER — Ambulatory Visit: Admit: 2014-08-12 | Payer: Medicare Other | Admitting: Radiation Oncology

## 2014-08-12 ENCOUNTER — Encounter: Payer: Self-pay | Admitting: Radiation Oncology

## 2014-08-12 ENCOUNTER — Ambulatory Visit
Admission: RE | Admit: 2014-08-12 | Discharge: 2014-08-12 | Disposition: A | Discharge status: Home / Self Care | Payer: Medicare Other | Source: Ambulatory Visit | Attending: Radiation Oncology | Admitting: Radiation Oncology

## 2014-08-12 DIAGNOSIS — C7931 Secondary malignant neoplasm of brain: Secondary | ICD-10-CM | POA: Diagnosis not present

## 2014-08-12 NOTE — Progress Notes (Signed)
INITIAL NUTRITION ASSESSMENT  DOCUMENTATION CODES Per approved criteria  -Severe malnutrition in the context of chronic illness  Pt meets criteria for severe MALNUTRITION in the context of chronic illness as evidenced by 17% wt loss in <3 months and po intake <75% of estimated needs for >1 month.   INTERVENTION: - Encourage PO intake.  - RD will continue to monitor  NUTRITION DIAGNOSIS: Inadequate oral intake related to metastatic cancer as evidenced by poor po intake and wt loss.  Goal: Pt to meet >/= 90% of their estimated nutrition needs   Monitor:  Weight trend, po intake, labs, need for supplements  Reason for Assessment: Malnutrition Screening Tool  74 y.o. female  Admitting Dx: Tachycardia  ASSESSMENT: 74 y/o ?  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 Admitted to [facility] 3/17 with episodic tachyarrhythmia while getting ready for XRT at Radiation-Oncology suite No acute findings-Had Thoracocentesis 3/18 yielding 620 cc fluid  - Pt followed by palliative care.  - Current diet is Regular. PO intake recorded as 0-30%. She reports that her po intake at home has been poor.  - Refused nutritional supplements. She does not like anything sweet and refused protein powders.  - RD will continue to monitor po intake and need for nutritional supplementation.  - No signs of fat or muscle depletion.  - Pt with 37 lb wt loss in the past 3 months. (significant for time frame) - Labs reviewed  Height: Ht Readings from Last 1 Encounters:  08/08/14 5' 7.5" (1.715 m)    Weight: Wt Readings from Last 1 Encounters:  08/08/14 177 lb 9.6 oz (80.559 kg)    Ideal Body Weight: 62.8 kg  % Ideal Body Weight: 128%  Wt Readings from Last 10 Encounters:  08/08/14 177 lb 9.6 oz (80.559 kg)  07/29/14 177 lb (80.287 kg)  07/22/14 177 lb (80.287 kg)    07/15/14 180 lb (81.647 kg)  06/26/14 195 lb (88.451 kg)  06/24/14 195 lb 14.4 oz (88.86 kg)  06/19/14 193 lb 11.2 oz (87.862 kg)  06/10/14 195 lb 9.6 oz (88.724 kg)  05/23/14 197 lb 4.8 oz (89.495 kg)  05/01/14 215 lb (97.523 kg)    Usual Body Weight: 215 lbs  % Usual Body Weight: 82%  BMI:  Body mass index is 27.39 kg/(m^2).  Estimated Nutritional Needs: Kcal: 2000-2200 Protein: 105-120 g Fluid: 2.0 /day  Skin: puncture wound on back  Diet Order: Diet regular  EDUCATION NEEDS: -Education needs addressed   Intake/Output Summary (Last 24 hours) at 08/12/14 1521 Last data filed at 08/12/14 1428  Gross per 24 hour  Intake    120 ml  Output    600 ml  Net   -480 ml    Last BM: 3/21   Labs:   Recent Labs Lab 08/08/14 1531 08/09/14 0516 08/11/14 0532  NA 143 144 141  K 4.3 4.2 4.6  CL 111 107 104  CO2 _0 BUN 16 16 25*  CREATININE 0.69 0.71 0.67  CALCIUM 8.4 8.8 8.3*  GLUCOSE 79 96 113*    CBG (last 3)  No results for input(s): GLUCAP in the last 72 hours.  Scheduled Meds: . carvedilol  6.25 mg Oral BID WC  . cholecalciferol  1,000 Units Oral Daily  . [START ON 08/18/2014] dexamethasone  2 mg Oral Q12H  . [START ON 08/23/2014]  dexamethasone  2 mg Oral Daily  . dexamethasone  4 mg Oral Q12H  . dronabinol  2.5 mg Oral BID AC  . enoxaparin (LOVENOX) injection  80 mg Subcutaneous Q12H  . feeding supplement (ENSURE COMPLETE)  237 mL Oral BID BM  . ferrous sulfate  325 mg Oral Q breakfast  . folic acid  1 mg Oral Daily  . furosemide  20 mg Oral Daily  . hydrochlorothiazide  25 mg Oral Daily  . levETIRAcetam  1,000 mg Oral BID  . mirtazapine  15 mg Oral QHS  . potassium chloride SA  20 mEq Oral BID  . sodium chloride  3 mL Intravenous Q12H    Continuous Infusions:   Past Medical History  Diagnosis Date  . Hypertension   . Hyperlipidemia   . Knee injury 1983    left  . Lung cancer dx'd 03/2014  . Brain cancer   . Seizures     Past  Surgical History  Procedure Laterality Date  . Video bronchoscopy Bilateral 04/19/2014    Procedure: VIDEO BRONCHOSCOPY WITHOUT FLUORO;  Surgeon: Rigoberto Noel, MD;  Location: [facility];  Service: Cardiopulmonary;  Laterality: Bilateral;  . Abdominal hysterectomy  [address] [place], [place], [place]

## 2014-08-12 NOTE — Progress Notes (Signed)
  Radiation Oncology         [phone]) [phone] ________________________________  Stereotactic Treatment Procedure Note INPATIENT  Name: Michelle Cobb MRN: [id number]  Date: 08/12/2014  DOB: November 19, 1940  SPECIAL TREATMENT PROCEDURE  3D TREATMENT PLANNING AND DOSIMETRY:  The patient's radiation plan was reviewed and approved by neurosurgery and radiation oncology prior to treatment.  It showed 3-dimensional radiation distributions overlaid onto the planning CT/MRI image set.  The [facility] for the target structures as well as the organs at risk were reviewed. The documentation of the 3D plan and dosimetry are filed in the radiation oncology EMR.  NARRATIVE:  Michelle Cobb was brought to the TrueBeam stereotactic radiation treatment machine and placed supine on the CT couch. The head frame was applied, and the patient was set up for stereotactic radiosurgery.  Neurosurgery was present for the set-up and delivery  SIMULATION VERIFICATION:  In the couch zero-angle position, the patient underwent Exactrac imaging using the Brainlab system with orthogonal KV images.  These were carefully aligned and repeated to confirm treatment position for each of the isocenters.  The Exactrac snap film verification was repeated at each couch angle.  SPECIAL TREATMENT PROCEDURE: Michelle Cobb received stereotactic radiosurgery to the following targets: Left Frontal 6.65mm target was treated using 3 Dynamic Conformal Arcs to a prescription dose of 20 Gy.  ExacTrac Snap verification was performed for each couch angle. This constitutes a special treatment procedure due to the ablative dose delivered and the technical nature of treatment.  This highly technical modality of treatment ensures that the ablative dose is centered on the patient's tumor while sparing normal tissues from excessive dose and risk of detrimental effects.  STEREOTACTIC TREATMENT MANAGEMENT:  Following delivery, the patient was transported back  upstairs to the inpatient floor. The patient tolerated treatment without significant acute effects.  PLAN: Follow-up in one month with Dr [name].  ________________________________   Eppie Gibson, MD

## 2014-08-12 NOTE — Progress Notes (Signed)
Michelle Cobb:[id number] DOB: 03/31/1941 DOA: 08/08/2014 PCP: Woody Seller, MD  Brief narrative: 74 y/o ?  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 Admitted to [facility] 3/17 with episodic tachyarrhythmia while getting ready for XRT at Radiation-Oncology suite No acute findings-Had Thoracocentesis 3/18 yielding 620 cc fluid Underwent stereotactic radiosurgery 3/21  Past medical history-As per Problem list Chart reviewed as below- Reviewed  Consultants:  Oncology  Procedures:  nad  Antibiotics:  nad   Subjective   Tired Doesn't want any Pleurx drain No shortness of breath Lower extremity edema about the same No nausea no vomiting    Objective    Interim History:   Telemetry:    Objective: Filed Vitals:   08/11/14 1511 08/11/14 2042 08/12/14 0504 08/12/14 1410  BP: 112/68 129/69 127/67 136/70  Pulse: 75 76 76 75  Temp: 97.8 F (36.6 C) 97.8 F (36.6 C) 97.6 F (36.4 C) 97.9 F (36.6 C)  TempSrc: Oral Oral Oral Oral  Resp: 16 16 18 18   Height:      Weight:      SpO2: 96% 96% 97% 96%    Intake/Output Summary (Last 24 hours) at 08/12/14 1855 Last data filed at 08/12/14 1428  Gross per 24 hour  Intake    120 ml  Output    300 ml  Net   -180 ml    Exam:  General: eomi, ncat Cardiovascular: s1 s2 no m/r/g Respiratory: clear no added sound Abdomen: soft, NT, ND Skin grade 1 LE edema Neuro intact  Data Reviewed: Basic Metabolic Panel:  Recent Labs Lab 08/06/14 0850 08/08/14 1531 08/09/14 0516 08/11/14 0532  NA 140 143 144 141  K 3.2* 4.3 4.2 4.6  CL 110 111 107 104  CO2 23 21 26 25   GLUCOSE 130* 79 96 113*  BUN 19 16 16  25*  CREATININE 0.79 0.69 0.71 0.67  CALCIUM 8.3* 8.4 8.8 8.3*   Liver Function Tests:  Recent Labs Lab 08/08/14 1531 08/11/14 0532  AST 18 14  ALT 15 16    ALKPHOS 40 35*  BILITOT 1.0 0.9  PROT 6.1 5.9*  ALBUMIN 2.4* 2.5*   No results for input(s): LIPASE, AMYLASE in the last 168 hours. No results for input(s): AMMONIA in the last 168 hours. CBC:  Recent Labs Lab 08/06/14 0850 08/08/14 1531 08/09/14 0516 08/11/14 0532  WBC 14.6* 17.2* 13.0* 14.3*  NEUTROABS  --  14.5*  --  12.9*  HGB 10.1* 11.1* 10.4* 10.3*  HCT 29.9* 32.0* 30.2* 30.0*  MCV 74.0* 74.1* 73.7* 74.8*  PLT 201 149* 174 148*   Cardiac Enzymes:  Recent Labs Lab 08/08/14 1531  TROPONINI <0.03   BNP: Invalid input(s): POCBNP CBG:  Recent Labs Lab 08/06/14 0746 08/07/14 0757  GLUCAP 104* 151*    Recent Results (from the past 240 hour(s))  Urine culture     Status: None   Collection Time: 08/03/14 10:26 PM  Result Value Ref Range Status   Specimen Description URINE, CLEAN CATCH  Final   Special Requests NONE  Final   Colony Count NO GROWTH Performed at [facility]   Final   Culture NO GROWTH Performed at [facility]   Final   Report Status 08/05/2014 FINAL  Final  Culture, sputum-assessment     Status: None   Collection Time: 08/05/14 10:25 AM  Result Value Ref Range Status   Specimen Description SPUTUM  Final   Special Requests NONE  Final   Sputum evaluation   Final    THIS SPECIMEN IS ACCEPTABLE. RESPIRATORY CULTURE REPORT TO FOLLOW.   Report Status 08/05/2014 FINAL  Final  Culture, respiratory (NON-Expectorated)     Status: None   Collection Time: 08/05/14 10:25 AM  Result Value Ref Range Status   Specimen Description SPUTUM  Final   Special Requests NONE  Final   Gram Stain   Final    ABUNDANT WBC PRESENT, PREDOMINANTLY PMN FEW SQUAMOUS EPITHELIAL CELLS PRESENT FEW GRAM POSITIVE COCCI IN PAIRS Performed at [facility]    Culture   Final    NORMAL OROPHARYNGEAL FLORA Performed at [facility]    Report Status 08/07/2014 FINAL  Final  MRSA PCR Screening     Status: None   Collection Time:  08/09/14 12:44 AM  Result Value Ref Range Status   MRSA by PCR NEGATIVE NEGATIVE Final    Comment:        The GeneXpert MRSA Assay (FDA approved for NASAL specimens only), is one component of a comprehensive MRSA colonization surveillance program. It is not intended to diagnose MRSA infection nor to guide or monitor treatment for MRSA infections.   Body fluid culture     Status: None (Preliminary result)   Collection Time: 08/09/14  4:46 PM  Result Value Ref Range Status   Specimen Description Pleural, L  Final   Special Requests Normal  Final   Gram Stain   Final    WBC PRESENT, PREDOMINANTLY MONONUCLEAR NO ORGANISMS SEEN Performed at [facility]    Culture   Final    NO GROWTH 2 DAYS Performed at [facility]    Report Status PENDING  Incomplete     Studies:              All Imaging reviewed and is as per above notation   Scheduled Meds: . carvedilol  6.25 mg Oral BID WC  . cholecalciferol  1,000 Units Oral Daily  . [START ON 08/18/2014] dexamethasone  2 mg Oral Q12H  . [START ON 08/23/2014] dexamethasone  2 mg Oral Daily  . dexamethasone  4 mg Oral Q12H  . dronabinol  2.5 mg Oral BID AC  . enoxaparin (LOVENOX) injection  80 mg Subcutaneous Q12H  . feeding supplement (ENSURE COMPLETE)  237 mL Oral BID BM  . ferrous sulfate  325 mg Oral Q breakfast  . folic acid  1 mg Oral Daily  . furosemide  20 mg Oral Daily  . hydrochlorothiazide  25 mg Oral Daily  . levETIRAcetam  1,000 mg Oral BID  . mirtazapine  15 mg Oral QHS  . potassium chloride SA  20 mEq Oral BID  . sodium chloride  3 mL Intravenous Q12H   Continuous Infusions:    Assessment/Plan:  Tachycardia- patient has a chronic sinus tachycardia. not on any PTCA beta blocker.  I suspect her tachycardia is related to her stage IV cancer.  =/- pleural effusion  She does not appear volume depleted but does have third spacing because of her pleural effusion Continue Coreg 6. 25 twice a  day Another element of her tachycardia could be her anxiety because of her diagnosis.    DVT (deep venous thrombosis)- the patient is on therapeutic doses of Lovenox. Lovenox resumed full dose s/p thoracocentesis Lower extremity edema may in part secondary to this   Chronic left-sided  pleural effusion- appeciate input from interventional radiology-600 cc drained 3/19.   Await Cytology/culture She has lower extremity swelling and I will start PO lasix 3/20 She is continuing to have chest pain so I do think that moderate doses of Roxanol may be in order and I will address this with her in the next 1-2 days We tried to give her Lasix by mouth but her creatinine rose 3/21 and therefore this was discontinued   Anemia of chronic disease-likely of malignancy- consider continuing FeSO4.  Patient also noted to be on therapeutic doses of Lovenox.  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  Her performance scale/score is good but with progression of her disease and repeated issues, we will need potentially palliative care to be involved in her further care --patient unwilling to face /scared to face Life limiting illness in settign of good Ecog status Appreciate Palliative attempts to engage her Dr. Burr Medico will d/c Rad-ONC re further plans I'm not clear what end point this patient has. I do think that further discussion as needed with her but that if she remains clinically stable over the next 1-2 days and no significant reaccumulation of her fluid occurs on chest x-ray on 3/21, we can  discharge her home. She adamantly refuses Pleurx catheter draining   Patient nonambulatory and likes to use a wheelchair. He will monitor   Hypertension-continue HCTZ 25 mg daily.    On Low-dose Coreg as above  Acute kidney injury Discontinue by mouth Lasix   Code Status: full  Family Communication: none bedsdie Disposition Plan:  Discharged to facility a.m. if creatinine stable   Verneita Griffes, MD  [facility] Pager [phone] 08/12/2014, 6:55 PM    LOS: 4 days

## 2014-08-12 NOTE — Progress Notes (Signed)
[facility] Therapy Treatment Record Phone [phone]   Radiation Therapy was administered to Michelle Cobb on: 08/12/2014  1:24 PM and was treatment # 1 out of a planned course of 1 treatments.

## 2014-08-12 NOTE — Progress Notes (Signed)
Pt transported to radiation/oncology suite. Pt refused to wear socks. RN reiterated that socks will not only help keep her warm, which pt was concerned about, but would also help her grip the floor and help prevent slipping/falling. Pt still refused and was transported to radiation/oncology without socks on.

## 2014-08-12 NOTE — Op Note (Signed)
Stereotactic Radiosurgery Operative Note  Name: Michelle Cobb MRN: [id number]  Date: 08/12/2014  DOB: Oct 13, 1940  Op Note  Pre Operative Diagnosis:  Metastatic lung cancer   Post Operative Diagnois:  Metastatic lung cancer  3D TREATMENT PLANNING AND DOSIMETRY:  The patient's radiation plan was reviewed and approved by myself (neurosurgery) and Dr. Ledon Snare (radiation oncology) prior to treatment.  It showed 3-dimensional radiation distributions overlaid onto the planning CT/MRI image set.  The [facility] for the target structures as well as the organs at risk were reviewed. The documentation of the 3D plan and dosimetry are filed in the radiation oncology EMR.  NARRATIVE:  Michelle Cobb was brought to the TrueBeam stereotactic radiation treatment machine and placed supine on the CT couch. The head frame was applied, and the patient was set up for stereotactic radiosurgery.  I was present for the set-up and delivery.  Treatment was done with Dr. Eppie Gibson (radiation oncology).  SIMULATION VERIFICATION:  In the couch zero-angle position, the patient underwent Exactrac imaging using the Brainlab system with orthogonal KV images.  These were carefully aligned and repeated to confirm treatment position for each of the isocenters.  The Exactrac snap film verification was repeated at each couch angle.  SPECIAL TREATMENT PROCEDURE: Michelle Cobb received stereotactic radiosurgery to the following targets: Left frontal target was treated using 3 Dynamic Conformal Arcs to a prescription dose of 20 Gy.  ExacTrac registration was performed for each couch angle.  The 77.5% isodose line was prescribed.  STEREOTACTIC TREATMENT MANAGEMENT:  Following delivery, the patient was transported to nursing in stable condition and monitored for possible acute effects.  Vital signs were recorded There were no vitals taken for this visit.. The patient tolerated treatment without significant acute effects, and was  discharged to home in stable condition.    PLAN: Follow-up in one month with Dr. [name].

## 2014-08-13 ENCOUNTER — Telehealth: Payer: Self-pay | Admitting: *Deleted

## 2014-08-13 LAB — COMPREHENSIVE METABOLIC PANEL
ALT: 14 U/L (ref 0–35)
AST: 15 U/L (ref 0–37)
Albumin: 2.7 g/dL — ABNORMAL LOW (ref 3.5–5.2)
Alkaline Phosphatase: 40 U/L (ref 39–117)
Anion gap: 8 (ref 5–15)
BILIRUBIN TOTAL: 1.1 mg/dL (ref 0.3–1.2)
BUN: 25 mg/dL — AB (ref 6–23)
CHLORIDE: 103 mmol/L (ref 96–112)
CO2: 27 mmol/L (ref 19–32)
CREATININE: 0.84 mg/dL (ref 0.50–1.10)
Calcium: 8.3 mg/dL — ABNORMAL LOW (ref 8.4–10.5)
GFR, EST AFRICAN AMERICAN: 78 mL/min — AB (ref >90)
GFR, EST NON AFRICAN AMERICAN: 67 mL/min — AB (ref >90)
Glucose, Bld: 125 mg/dL — ABNORMAL HIGH (ref 70–99)
Potassium: 5 mmol/L (ref 3.5–5.1)
Sodium: 138 mmol/L (ref 135–145)
TOTAL PROTEIN: 6.4 g/dL (ref 6.0–8.3)

## 2014-08-13 LAB — BODY FLUID CULTURE
Culture: NO GROWTH
Special Requests: NORMAL

## 2014-08-13 MED ORDER — ENSURE COMPLETE PO LIQD: 237.0000 mL | Freq: Two times a day (BID) | ORAL | Status: AC

## 2014-08-13 MED ORDER — CARVEDILOL 25 MG PO TABS: 25.0000 mg | ORAL_TABLET | Freq: Two times a day (BID) | ORAL | Status: AC

## 2014-08-13 NOTE — Telephone Encounter (Signed)
Call received from Agnes Lawrence.  Asking how his mother is doing.  Reports she was discharged from Hospital to rehab.  Was transferred back to hospital due to fluid in her lungs and feet swollen.  Advised to call [place].  "Her social worker's name is Georgina Peer.  The rehab center can't help me.  I've called her room and she is not answering the phone."  [facility]le Number given ([phone]).  Advised to ask for nurse assigned to her.

## 2014-08-13 NOTE — Progress Notes (Signed)
ANTICOAGULATION CONSULT NOTE - Follow Up Consult  Pharmacy Consult for Enoxaparin Indication: Hx DVT  No Known Allergies  Patient Measurements: Height: 5' 7.5" (171.5 cm) Weight: 177 lb 9.6 oz (80.559 kg) IBW/kg (Calculated) : 62.75  Vital Signs: Temp: 97.8 F (36.6 C) (03/22 0425) Temp Source: Oral (03/22 0425) BP: 128/86 mmHg (03/22 0425) Pulse Rate: 80 (03/22 0425)  Labs:  Recent Labs  08/11/14 0532 08/13/14 0405  HGB 10.3*  --   HCT 30.0*  --   PLT 148*  --   CREATININE 0.67 0.84    Estimated Creatinine Clearance: 65.8 mL/min (by C-G formula based on Cr of 0.84).   Medications:  Scheduled:  . carvedilol  6.25 mg Oral BID WC  . cholecalciferol  1,000 Units Oral Daily  . [START ON 08/18/2014] dexamethasone  2 mg Oral Q12H  . [START ON 08/23/2014] dexamethasone  2 mg Oral Daily  . dexamethasone  4 mg Oral Q12H  . dronabinol  2.5 mg Oral BID AC  . enoxaparin (LOVENOX) injection  80 mg Subcutaneous Q12H  . feeding supplement (ENSURE COMPLETE)  237 mL Oral BID BM  . ferrous sulfate  325 mg Oral Q breakfast  . folic acid  1 mg Oral Daily  . hydrochlorothiazide  25 mg Oral Daily  . levETIRAcetam  1,000 mg Oral BID  . mirtazapine  15 mg Oral QHS  . potassium chloride SA  20 mEq Oral BID  . sodium chloride  3 mL Intravenous Q12H   Assessment: 84 yoF admitted 3/17 with chronic left sided pleural effusion, and stage IV metastatic lung cancer currently receiving nivolumab with last dose given 2/26.  She is on Lovenox prior to admission for history of DVT, this will be held for thoracentesis on 3/18.  Pharmacy is consulted to resume Lovenox dosing on 3/18 s/p thoracentesis.  No complications from procedure documented.  CBC (last on 3/20): Hgb 10.3 is low but stable.  Plt 148 are decreased, but fluctuate near baseline. Renal:  SCr 0.84 with CrCl ~ 65 ml/min.   Goal of Therapy:  Anti-Xa level 0.6-1 units/ml 4hrs after LMWH dose given Monitor platelets by anticoagulation  protocol: Yes   Plan:   Continue Enoxaparin 80mg  SQ q12h  Follow up renal function and CBC at least q72h.  Follow up discharge planning.  Gretta Arab PharmD, BCPS Pager [phone] 08/13/2014 8:03 AM

## 2014-08-13 NOTE — Progress Notes (Signed)
Pt d/c to [facility]. Full report given to [facility] at [phone]. Pt PIV removed without complication. Pt to be transported via stretcher with all paperwork given to transporter.

## 2014-08-13 NOTE — Discharge Summary (Signed)
Physician Discharge Summary  Michelle Cobb LXB:[id number] DOB: 03/26/1941 DOA: 08/08/2014  PCP: Woody Seller, MD  Admit date: 08/08/2014 Discharge date: 08/13/2014  Time spent: 45 minutes  Recommendations for Outpatient Follow-up:  1. Recommend holistic review of goals of care as an outpatient-patient is at very high risk for decompensation as well as further worsening of her cancer state and is actively being treated with  radiation therapy as an outpatient 2. The patient so far is undecided as to goals of care and further discussion about this 3. I would recommend screening for geriatric depression as well as dysthymia as her appetite is poor 4. Please obtain a wheelchair for her at nursing facility 5. Please obtain oxygen saturations and if she goes below 90 [at rest here she was above 90] I would recommend oxygen supplementation as an outpatient 6. She's been discharged with oral HCTZ 25 daily-consider addition of Lasix low-dose 20 mg by mouth if she has increasing lower extremity swelling 7. Patient will be discharged to [facility] facility 8. She should continue her Lovenox for DVT treatment long-term chronically--given her history of cancer, I'm hesitant to place her on any other agent   I'm not clear what end point this patient has. 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 Admitted to [facility] 3/17 with episodic tachyarrhythmia while getting ready for XRT at Radiation-Oncology suite No acute findings-Had Thoracocentesis 3/18 yielding 620 cc fluid Underwent stereotactic radiosurgery 3/21  Hospital Course:    Tachycardia- patient has a chronic sinus tachycardia.  I suspect her tachycardia is related to her stage IV cancer. +/- pleural effusion She does not appear volume depleted but does have third spacing because of her pleural effusion Coreg 6. 25 twice a day was started and on discharge this was uptitrated to 25 twice a day Another element of her tachycardia could be her anxiety because of her diagnosis.    DVT (deep venous thrombosis)- the patient is on therapeutic doses of Lovenox. Lovenox resumed full dose s/p thoracocentesis Lower extremity edema may in part secondary to this I would be cautious and not place her on a novel agent for anticoagulation unless okayed by oncology   Chronic left-sided pleural effusion- appeciate input from interventional radiology-600 cc drained 3/19.  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 Patient also noted to be on therapeutic doses of Lovenox.  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  Her performance scale/score is good but with progression of her disease and repeated issues, we will need potentially palliative care to be involved in her further care --patient unwilling to face /scared to face Life limiting illness in settign of good Ecog status Appreciate Palliative attempts to engage her Dr. Burr Medico will d/c Rad-ONC re further plans   Hypertension-continue HCTZ 25 mg daily.   On Low-dose Coreg as above  Acute kidney injury Discontinue by mouth Lasix which was started during admission  Consultants:  Oncology  Procedures:  nad  Antibiotics:  nad  Discharge Exam: Filed Vitals:   08/13/14 0425  BP: 128/86  Pulse: 80  Temp: 97.8 F (36.6 C)  Resp: 16    More alert active than previously  General: EOMI NCAT Cardiovascular: S1-S2 no murmur rub or gallop Respiratory: Clinically clear  Discharge Instructions   Discharge Instructions    Diet - low sodium heart healthy    Complete by:  As directed      Discharge instructions    Complete by:  As directed   Consider OP goals of care-- she has not made any further decision about this as she is still undecided.     Increase activity slowly    Complete by:  As directed           Current Discharge Medication List    START taking these medications   Details  carvedilol (COREG) 25 MG tablet Take 1 tablet (25 mg total) by mouth 2 (two) times daily with a meal. Qty: 60 tablet, Refills: 0    feeding supplement, ENSURE COMPLETE,  (ENSURE COMPLETE) LIQD Take 237 mLs by mouth 2 (two) times daily between meals.      CONTINUE these medications which have NOT CHANGED   Details  cholecalciferol (VITAMIN D) 1000 UNITS tablet Take 1,000 Units by mouth daily.    dexamethasone (DECADRON) 4 MG tablet Take 1 tablet (4 mg total) by mouth 3 (three) times daily. TID for 3 days, then BID 1 week, then 2 mg bid 5 days, 2 mg daily 5days,then stop Qty: 60 tablet, Refills: 2   Associated Diagnoses: Brain metastasis    dronabinol (MARINOL) 2.5 MG capsule Take 1 capsule (2.5 mg total) by mouth 2 (two) times daily before a meal. Qty: 60 capsule, Refills: 3   Associated Diagnoses: Brain metastasis    enoxaparin (LOVENOX) 80 MG/0.8ML injection Inject 80 mg into the skin every 12 (twelve) hours.  Refills: 1    ferrous sulfate 325 (65 FE) MG tablet Take 1 tablet (325 mg total) by mouth 3 (three) times daily with meals. Qty: 90 tablet, Refills: 3    folic acid (FOLVITE) 1 MG tablet Take 1 mg by mouth daily.  Refills: 3    hydrochlorothiazide (HYDRODIURIL) 25 MG tablet Take 25 mg by  mouth daily.    levETIRAcetam (KEPPRA) 1000 MG tablet Take 1,000 mg by mouth 2 (two) times daily. Refills: 2    mirtazapine (REMERON) 15 MG tablet Take 1 tablet (15 mg total) by mouth at bedtime. Qty: 30 tablet, Refills: 2    polyethylene glycol (MIRALAX / GLYCOLAX) packet Take 17 g by mouth daily. Qty: 14 each, Refills: 0    potassium chloride SA (K-DUR,KLOR-CON) 20 MEQ tablet Take 1 tablet (20 mEq total) by mouth 2 (two) times daily. Qty: 60 tablet, Refills: 1    PRESCRIPTION MEDICATION Chemotherapy.    tuberculin 5 UNIT/0.1ML injection Inject 5 Units into the skin once.    atorvastatin (LIPITOR) 10 MG tablet Take 10 mg by mouth at bedtime.  Refills: 0      STOP taking these medications     levofloxacin (LEVAQUIN) 500 MG tablet      metroNIDAZOLE (FLAGYL) 500 MG tablet        No Known Allergies    The results of significant  diagnostics from this hospitalization (including imaging, microbiology, ancillary and laboratory) are listed below for reference.    Significant Diagnostic Studies: Dg Chest 1 View  08/09/2014   CLINICAL DATA:  Post left thoracentesis  EXAM: CHEST  1 VIEW  COMPARISON:  08/08/2014  FINDINGS: Moderately large loculated left pleural effusion is similar to the prior study. Compressive atelectasis in the left lower lobe also unchanged. No pneumothorax post thoracentesis.  Right lung remains clear.  Negative for heart failure.  IMPRESSION: Negative for pneumothorax post left thoracentesis.   Electronically Signed   By: Franchot Gallo M.D.   On: 08/09/2014 17:12   Dg Chest 1 View  07/18/2014   CLINICAL DATA:  Pain following thoracentesis.  Left-sided effusion.  EXAM: CHEST  1 VIEW  COMPARISON:  CT chest with contrast 07/11/2014.  FINDINGS: The heart size is normal. There is a significant decrease in the left pleural effusion. There is no pneumothorax. The right lung is clear. The visualized soft tissues and bony thorax are otherwise unremarkable.  IMPRESSION: 1. Decreased left pleural effusion following thoracentesis. 2. No significant pneumothorax or other complication evident by radiograph. 3. The right lung is clear.   Electronically Signed   By: San Morelle M.D.   On: 07/18/2014 12:07   Dg Chest 2 View  08/11/2014   CLINICAL DATA:  Weakness, known history of stage IV carcinoma  EXAM: CHEST  2 VIEW  COMPARISON:  08/09/2014  FINDINGS: The right lung is clear. The cardiac shadow is stable. Persistent but slightly decreased left-sided pleural effusion is again noted. No new focal infiltrate is seen. No pneumothorax is noted.  IMPRESSION: Persistent but slightly improved left pleural effusion. Likely underlying consolidation is noted.   Electronically Signed   By: Inez Catalina M.D.   On: 08/11/2014 09:27   Dg Chest 2 View  08/01/2014   CLINICAL DATA:  Metastatic lung ca - cough - SOB - Pt resides at  home. Pt c/o of SOB N/V. Symptoms started after returning home from here for MRI today  EXAM: CHEST - 2 VIEW  COMPARISON:  07/23/2014  FINDINGS: Moderate left pleural effusion. Consolidation/ atelectasis at the left lung base as before. Right lung clear. Heart size difficult to assess due to adjacent opacity. No pneumothorax Visualized skeletal structures are unremarkable.  IMPRESSION: 1. Stable moderate left pleural effusion and adjacent basilar consolidation/atelectasis.   Electronically Signed   By: Lucrezia Europe M.D.   On: 08/01/2014 20:10   Dg Chest  2 View  07/23/2014   CLINICAL DATA:  Focal left arm seizures; known history of brain cancer and lung cancer. Initial encounter.  EXAM: CHEST  2 VIEW  COMPARISON:  Chest radiograph performed 07/18/2014  FINDINGS: There is mildly increased moderate left pleural effusion with associated airspace opacity. The right lung appears clear. There is mild rightward mediastinal shift, likely reflecting the pleural effusion. No pneumothorax is seen. The underlying left lower lobe lung mass is not well characterized on radiograph.  The cardiomediastinal silhouette remains borderline normal in size. No acute osseous abnormalities are identified.  IMPRESSION: Mildly increased moderate left pleural effusion, with associated airspace opacity. Underlying known lung mass not well characterized on radiograph.   Electronically Signed   By: Garald Balding M.D.   On: 07/23/2014 18:17   Ct Head Wo Contrast  07/23/2014   CLINICAL DATA:  Focal seizure  EXAM: CT HEAD WITHOUT CONTRAST  TECHNIQUE: Contiguous axial images were obtained from the base of the skull through the vertex without intravenous contrast.  COMPARISON:  04/18/2014  FINDINGS: Skull and Sinuses:Negative for fracture or destructive process. The mastoids, middle ears, and imaged paranasal sinuses are clear.  Orbits: Dysconjugate gaze.  Brain: Known cerebral metastases in the bilateral posterior cerebral hemispheres, parietal on  the right and frontal parietal on the left. The masses are cortically based on the right and subcortical on the left. Edema surrounding the left-sided metastasis has increased, but there is no midline shift or herniation, and the metastasis has increased in diameter to 18 mm (13 mm by contrast-enhanced MRI 05/01/2014). The edema tracks into the external and internal capsules posteriorly, making the posterior putamen prominent. Low-attenuation in the bilateral anterior internal capsules is chronic, likely from multiple dilated perivascular spaces and white matter gliosis based on previous brain MRI. There is no evidence of acute hemorrhage. No acute infarct or hydrocephalus.  IMPRESSION: Known brain metastases. Compared to 05/01/2014 brain MRI, a left posterior frontal metastasis has increased size and vasogenic edema.   Electronically Signed   By: Monte Fantasia M.D.   On: 07/23/2014 21:09   Mr Jeri Cos CH Contrast  07/29/2014   CLINICAL DATA:  Metastatic lung cancer. Three month stereotactic radiosurgery follow-up  EXAM: MRI HEAD WITHOUT AND WITH CONTRAST  TECHNIQUE: Multiplanar, multiecho pulse sequences of the brain and surrounding structures were obtained without and with intravenous contrast.  CONTRAST:  16 mL MultiHance IV  COMPARISON:  MRI 05/01/2014  FINDINGS: Multiple metastatic deposits. Increase in edema in the left parietal lobe secondary to enlarging hemorrhagic metastatic deposit. Right parietal edema also has progressed somewhat. Ventricle size remains normal. No significant shift of the midline structures. No acute infarct.  Punctate calcification right cerebellum unchanged without enhancement.  3 mm enhancing nodule right superior cerebellum unchanged consistent with metastatic deposit.  2 mm enhancing nodule central pons unchanged.  19 x 20 mm hemorrhagic lesion left parietal lobe has increased in size since the prior study. Increased surrounding white matter edema.  11 x 13 mm hemorrhagic  enhancing lesion high right parietal lobe is similar in size. Slight increase in surrounding edema.  New enhancing lesion left frontal lobe over the convexity measuring 4 x 6.6 mm. This is consistent with metastatic disease and is new.  Calvarium is negative.  Paranasal sinuses clear  IMPRESSION: Multiple metastatic deposits are present as described above.  Hemorrhagic lesion left parietal lobe has increased in size with increase in surrounding edema.  New lesion over the left frontal convexity measuring 4 x  6.6 mm.  Right high parietal lesion is similar in size but slightly increased edema since the prior study. Other lesions are stable.   Electronically Signed   By: Franchot Gallo M.D.   On: 07/29/2014 15:06   Dg Chest Port 1 View  08/08/2014   CLINICAL DATA:  Shortness of breath before treatment.  EXAM: PORTABLE CHEST - 1 VIEW  COMPARISON:  08/01/2014  FINDINGS: Stable moderate left pleural effusion. There is likely associated mild compressive atelectasis. There is no other focal parenchymal opacity. There is no right pleural effusion. There is no pneumothorax. Stable cardiomediastinal silhouette. No acute osseous abnormality.  IMPRESSION: 1. Stable moderate left pleural effusion.   Electronically Signed   By: Kathreen Devoid   On: 08/08/2014 15:03   US Thoracentesis Asp Pleural Space W/img Guide  08/09/2014   INDICATION: Stage IV adenocarcinoma of lung, recurrent left pleural effusion; request is made for diagnostic and therapeutic left thoracentesis.  EXAM: ULTRASOUND GUIDED DIAGNOSTIC AND THERAPEUTIC LEFT THORACENTESIS  COMPARISON:  Prior thoracentesis on 07/18/2014  MEDICATIONS: None  COMPLICATIONS: None immediate  TECHNIQUE: Informed written consent was obtained from the patient after a discussion of the risks, benefits and alternatives to treatment. A timeout was performed prior to the initiation of the procedure.  Initial ultrasound scanning demonstrates a moderate left pleural effusion. The lower chest  was prepped and draped in the usual sterile fashion. 1% lidocaine was used for local anesthesia.  An ultrasound image was saved for documentation purposes. An 6 Fr Safe-T-Centesis catheter was introduced. The thoracentesis was performed. The catheter was removed and a dressing was applied. The patient tolerated the procedure well without immediate post procedural complication. The patient was escorted to have an upright chest radiograph.  FINDINGS: A total of approximately 620 cc's of turbid, blood-tinged fluid was removed. Requested samples were sent to the laboratory. Only the above amount of fluid was removed at this time secondary to pt chest discomfort and increased coughing .  IMPRESSION: Successful ultrasound-guided diagnostic and therapeutic left sided thoracentesis yielding 620 cc's of pleural fluid.   Electronically Signed   By: Sandi Mariscal M.D.   On: 08/09/2014 17:39   US Thoracentesis Asp Pleural Space W/img Guide  07/18/2014   CLINICAL DATA:  Shortness of breath. Left-sided pleural effusion. Request diagnostic and therapeutic thoracentesis.  EXAM: ULTRASOUND GUIDED LEFT THORACENTESIS  COMPARISON:  None.  PROCEDURE: An ultrasound guided thoracentesis was thoroughly discussed with the patient and questions answered. The benefits, risks, alternatives and complications were also discussed. The patient understands and wishes to proceed with the procedure. Written consent was obtained.  Ultrasound was performed to localize and mark an adequate pocket of fluid in the left chest. The area was then prepped and draped in the normal sterile fashion. 1% Lidocaine was used for local anesthesia. Under ultrasound guidance a 19 gauge Yueh catheter was introduced. Thoracentesis was performed. The catheter was removed and a dressing applied.  COMPLICATIONS: None  FINDINGS: A total of approximately 750 mL of clear, dark amber colored fluid was removed. A fluid sample wassent for laboratory analysis.  IMPRESSION:  Successful ultrasound guided left thoracentesis yielding 750 mL of pleural fluid.  Read by: Ascencion Dike PA-C   Electronically Signed   By: Corrie Mckusick D.O.   On: 07/18/2014 11:27    Microbiology: Recent Results (from the past 240 hour(s))  Urine culture     Status: None   Collection Time: 08/03/14 10:26 PM  Result Value Ref Range Status   Specimen  Description URINE, CLEAN CATCH  Final   Special Requests NONE  Final   Colony Count NO GROWTH Performed at [facility]   Final   Culture NO GROWTH Performed at [facility]   Final   Report Status 08/05/2014 FINAL  Final  Culture, sputum-assessment     Status: None   Collection Time: 08/05/14 10:25 AM  Result Value Ref Range Status   Specimen Description SPUTUM  Final   Special Requests NONE  Final   Sputum evaluation   Final    THIS SPECIMEN IS ACCEPTABLE. RESPIRATORY CULTURE REPORT TO FOLLOW.   Report Status 08/05/2014 FINAL  Final  Culture, respiratory (NON-Expectorated)     Status: None   Collection Time: 08/05/14 10:25 AM  Result Value Ref Range Status   Specimen Description SPUTUM  Final   Special Requests NONE  Final   Gram Stain   Final    ABUNDANT WBC PRESENT, PREDOMINANTLY PMN FEW SQUAMOUS EPITHELIAL CELLS PRESENT FEW GRAM POSITIVE COCCI IN PAIRS Performed at [facility]    Culture   Final    NORMAL OROPHARYNGEAL FLORA Performed at [facility]    Report Status 08/07/2014 FINAL  Final  MRSA PCR Screening     Status: None   Collection Time: 08/09/14 12:44 AM  Result Value Ref Range Status   MRSA by PCR NEGATIVE NEGATIVE Final    Comment:        The GeneXpert MRSA Assay (FDA approved for NASAL specimens only), is one component of a comprehensive MRSA colonization surveillance program. It is not intended to diagnose MRSA infection nor to guide or monitor treatment for MRSA infections.   Body fluid culture     Status: None (Preliminary result)   Collection Time: 08/09/14   4:46 PM  Result Value Ref Range Status   Specimen Description Pleural, L  Final   Special Requests Normal  Final   Gram Stain   Final    WBC PRESENT, PREDOMINANTLY MONONUCLEAR NO ORGANISMS SEEN Performed at [facility]    Culture   Final    NO GROWTH 2 DAYS Performed at [facility]    Report Status PENDING  Incomplete     Labs: Basic Metabolic Panel:  Recent Labs Lab 08/08/14 1531 08/09/14 0516 08/11/14 0532 08/13/14 0405  NA 143 144 141 138  K 4.3 4.2 4.6 5.0  CL 111 107 104 103  CO2 _0 GLUCOSE 79 96 113* 125*  BUN 16 16 25* 25*  CREATININE 0.69 0.71 0.67 0.84  CALCIUM 8.4 8.8 8.3* 8.3*   Liver Function Tests:  Recent Labs Lab 08/08/14 1531 08/11/14 0532 08/13/14 0405  AST _1 ALT _2 ALKPHOS 40 35* 40  BILITOT 1.0 0.9 1.1  PROT 6.1 5.9* 6.4  ALBUMIN 2.4* 2.5* 2.7*   No results for input(s): LIPASE, AMYLASE in the last 168 hours. No results for input(s): AMMONIA in the last 168 hours. CBC:  Recent Labs Lab 08/08/14 1531 08/09/14 0516 08/11/14 0532  WBC 17.2* 13.0* 14.3*  NEUTROABS 14.5*  --  12.9*  HGB 11.1* 10.4* 10.3*  HCT 32.0* 30.2* 30.0*  MCV 74.1* 73.7* 74.8*  PLT 149* 174 148*   Cardiac Enzymes:  Recent Labs Lab 08/08/14 1531  TROPONINI <0.03   BNP: BNP (last 3 results)  Recent Labs  08/08/14 1531  BNP 161.7*    ProBNP (last 3 results) No results for input(s): PROBNP in the last  8760 hours.  CBG:  Recent Labs Lab 08/07/14 0757  GLUCAP 151*       Signed:  Nita Sells  [facility] 08/13/2014, 10:49 AM

## 2014-08-13 NOTE — Progress Notes (Signed)
Patient is set to discharge to [facility] today. Patient & cousin, Doroteo Bradford aware. Discharge packet given to RN, Belenda Cruise. [facility] called for transport.     Raynaldo Opitz, [facility] Clinical Social Worker cell #: [phone]

## 2014-08-15 ENCOUNTER — Telehealth: Payer: Self-pay | Admitting: Hematology

## 2014-08-15 NOTE — Progress Notes (Signed)
°  Radiation Oncology         [phone] ________________________________  Name: SHYIA FILLINGIM MRN: [id number]  Date: 08/12/2014  DOB: 02-28-1941  End of Treatment Note   ICD-9-CM ICD-10-CM    1. Brain metastasis 198.3 C79.31     DIAGNOSIS: 74 year old woman with an isolated 6.6 mm       Indication for treatment:  Palliation       Radiation treatment dates:   08/12/2014  Site/dose/beams/energy:   Left Frontal 6.61mm target was treated using 3 Dynamic Conformal Arcs to a prescription dose of 20 Gy.  ExacTrac Snap verification was performed for each couch angle. This constitutes a special treatment procedure due to the ablative dose delivered and the technical nature of treatment.  This highly technical modality of treatment ensures that the ablative dose is centered on the patient's tumor while sparing normal tissues from excessive dose and risk of detrimental effects.  Narrative: The patient tolerated radiation treatment relatively well.     Plan: The patient has completed radiation treatment. The patient will return to radiation oncology clinic for routine followup in one month. I advised her to call or return sooner if she has any questions or concerns related to her recovery or treatment. ________________________________  [name]. [name], M.D.

## 2014-08-15 NOTE — Telephone Encounter (Signed)
Left message to confirm appointment for 04/08. Mailed calendar

## 2014-08-16 ENCOUNTER — Ambulatory Visit: Payer: Medicare Other

## 2014-08-16 ENCOUNTER — Encounter: Payer: Medicare Other | Admitting: Nutrition

## 2014-08-16 ENCOUNTER — Other Ambulatory Visit: Payer: Medicare Other

## 2014-08-26 ENCOUNTER — Non-Acute Institutional Stay (SKILLED_NURSING_FACILITY): Payer: Medicare Other | Admitting: Internal Medicine

## 2014-08-26 DIAGNOSIS — C3432 Malignant neoplasm of lower lobe, left bronchus or lung: Secondary | ICD-10-CM

## 2014-08-26 DIAGNOSIS — I10 Essential (primary) hypertension: Secondary | ICD-10-CM

## 2014-08-26 DIAGNOSIS — E43 Unspecified severe protein-calorie malnutrition: Secondary | ICD-10-CM

## 2014-08-26 DIAGNOSIS — R0902 Hypoxemia: Secondary | ICD-10-CM | POA: Diagnosis not present

## 2014-08-26 DIAGNOSIS — R531 Weakness: Secondary | ICD-10-CM | POA: Diagnosis not present

## 2014-08-26 DIAGNOSIS — D638 Anemia in other chronic diseases classified elsewhere: Secondary | ICD-10-CM

## 2014-08-26 DIAGNOSIS — I82409 Acute embolism and thrombosis of unspecified deep veins of unspecified lower extremity: Secondary | ICD-10-CM

## 2014-08-26 NOTE — Progress Notes (Signed)
Patient ID: Michelle Cobb, female   DOB: 02-Apr-1941, 74 y.o.   MRN: [id number]    HISTORY AND PHYSICAL  Location:     [facility]   Place of Service:   SNF  Extended Emergency Contact Information Primary Emergency Contact: [place] Mobile Phone: [phone] Relation: Friend Secondary Emergency Contact: Clent [place] Phone: [phone] Relation: Relative  Advanced Directive information  FULL CODE  Chief Complaint  Patient presents with  . 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 She is currently receiving decadron for sz prohylaxis. She had an US guided thoracentesis on 2/25th. Today she c/o fatigue. Has not seen H/O in awhile. She was getting chemotx. No recent N/V/D.   BP stable on meds. No angina.  Breathing had improved but now worsening since thoracentesis.   Appetite is poor. She has a severe protein calorie malnutrition.  Pain is uncontrolled but she refuses pain med.  Past Medical History  Diagnosis Date  . Hypertension   . Hyperlipidemia   . Knee injury 1983    left  . Lung cancer dx'd 03/2014  . Brain cancer   . Seizures     Past Surgical History  Procedure Laterality Date  . Video bronchoscopy Bilateral 04/19/2014    Procedure: VIDEO BRONCHOSCOPY WITHOUT FLUORO;  Surgeon: Rigoberto Noel, MD;  Location: [facility];  Service: Cardiopulmonary;  Laterality: Bilateral;  . Abdominal hysterectomy  1983    Patient Care Team: Christain Sacramento, MD as PCP - General (Family Medicine)  History   Social History  . Marital Status: Single    Spouse Name: N/A  . Number of  Children: N/A  . Years of Education: N/A   Occupational History  . Not on file.   Social History Main Topics  . Smoking status: Former Research scientist (life sciences)  . Smokeless tobacco: Not on file  . Alcohol Use: Yes     Comment: 2 oz/week  . Drug Use: No  . Sexual Activity: Not Currently   Other Topics Concern  . Not on file   Social History Narrative     reports that she has quit smoking. She does not have any smokeless tobacco history on file. She reports that she drinks alcohol. She reports that she does not use illicit drugs.  Family History  Problem Relation Age of Onset  . Cancer Sister 32    unknown detail    No family status information on file.    Immunization History  Administered Date(s) Administered  . Influenza-Unspecified 02/20/2014  . Pneumococcal Polysaccharide-23 04/20/2014    No Known Allergies  Medications: Patient's Medications  New Prescriptions   No medications on file  Previous Medications   ATORVASTATIN (LIPITOR) 10 MG TABLET    Take 10 mg by mouth at bedtime.    CARVEDILOL (COREG) 25 MG TABLET    Take 1 tablet (25 mg total) by mouth 2 (two) times daily with a meal.   CHOLECALCIFEROL (VITAMIN D) 1000 UNITS TABLET    Take 1,000 Units by mouth daily.   DEXAMETHASONE (DECADRON) 4 MG TABLET    Take 1 tablet (4 mg total) by mouth 3 (three) times  daily. TID for 3 days, then BID 1 week, then 2 mg bid 5 days, 2 mg daily 5days,then stop   DRONABINOL (MARINOL) 2.5 MG CAPSULE    Take 1 capsule (2.5 mg total) by mouth 2 (two) times daily before a meal.   ENOXAPARIN (LOVENOX) 80 MG/0.8ML INJECTION    Inject 80 mg into the skin every 12 (twelve) hours.    FEEDING SUPPLEMENT, ENSURE COMPLETE, (ENSURE COMPLETE) LIQD    Take 237 mLs by mouth 2 (two) times daily between meals.   FERROUS SULFATE 325 (65 FE) MG TABLET    Take 1 tablet (325 mg total) by mouth 3 (three) times daily with meals.   FOLIC ACID (FOLVITE) 1 MG TABLET    Take 1 mg by mouth daily.    HYDROCHLOROTHIAZIDE  (HYDRODIURIL) 25 MG TABLET    Take 25 mg by mouth daily.   LEVETIRACETAM (KEPPRA) 1000 MG TABLET    Take 1,000 mg by mouth 2 (two) times daily.   MIRTAZAPINE (REMERON) 15 MG TABLET    Take 1 tablet (15 mg total) by mouth at bedtime.   POLYETHYLENE GLYCOL (MIRALAX / GLYCOLAX) PACKET    Take 17 g by mouth daily.   POTASSIUM CHLORIDE SA (K-DUR,KLOR-CON) 20 MEQ TABLET    Take 1 tablet (20 mEq total) by mouth 2 (two) times daily.   PRESCRIPTION MEDICATION    Chemotherapy.   TUBERCULIN 5 UNIT/0.1ML INJECTION    Inject 5 Units into the skin once.  Modified Medications   No medications on file  Discontinued Medications   No medications on file    Review of Systems  Unable to perform ROS: Other    There were no vitals filed for this visit. There is no weight on file to calculate BMI.  Physical Exam  Constitutional: She is oriented to person, place, and time. No distress.  Looks tired in NAD. Sitting in w/c. Awake and alert  HENT:  Mouth/Throat: Oropharynx is clear and moist and mucous membranes are normal. No oropharyngeal exudate.  Eyes: Pupils are equal, round, and reactive to light. No scleral icterus.  Neck: Neck supple. No tracheal deviation present. No thyromegaly present.  Cardiovascular: Normal rate, regular rhythm, normal heart sounds and intact distal pulses.  Exam reveals no gallop and no friction rub.   No murmur heard. +1 pitting LE edema b/l. no calf TTP. No carotid bruit b/l. No palpable calf cords  Pulmonary/Chest: Effort normal. No accessory muscle usage or stridor. No respiratory distress. Decreased breath sounds: markedly reduced L>R. She has wheezes (inspiratory and end expiratory). She has no rhonchi. She has rales. She exhibits no mass and no deformity.  Abdominal: Soft. Bowel sounds are normal. She exhibits no distension and no mass. There is no hepatosplenomegaly. There is no tenderness. There is no rebound and no guarding.  Musculoskeletal: She exhibits tenderness  (generalized).  Lymphadenopathy:    She has no cervical adenopathy.  Neurological: She is alert and oriented to person, place, and time. She has normal reflexes.  Skin: Skin is warm and dry. No rash noted.  Psychiatric: She has a normal mood and affect. Her behavior is normal. Judgment normal. Her speech is delayed.     Labs reviewed: Admission on 08/08/2014, Discharged on 08/13/2014  Component Date Value Ref Range Status  . Sodium 08/08/2014 143  135 - 145 mmol/L Final  . Potassium 08/08/2014 4.3  3.5 - 5.1 mmol/L Final  . Chloride 08/08/2014 111  96 - 112 mmol/L Final  . CO2  08/08/2014 21  19 - 32 mmol/L Final  . Glucose, Bld 08/08/2014 79  70 - 99 mg/dL Final  . BUN 08/08/2014 16  6 - 23 mg/dL Final  . Creatinine, Ser 08/08/2014 0.69  0.50 - 1.10 mg/dL Final  . Calcium 08/08/2014 8.4  8.4 - 10.5 mg/dL Final  . Total Protein 08/08/2014 6.1  6.0 - 8.3 g/dL Final  . Albumin 08/08/2014 2.4* 3.5 - 5.2 g/dL Final  . AST 08/08/2014 18  0 - 37 U/L Final  . ALT 08/08/2014 15  0 - 35 U/L Final  . Alkaline Phosphatase 08/08/2014 40  39 - 117 U/L Final  . Total Bilirubin 08/08/2014 1.0  0.3 - 1.2 mg/dL Final  . GFR calc non Af Amer 08/08/2014 84* >90 mL/min Final  . GFR calc Af Amer 08/08/2014 >90  >90 mL/min Final   Comment: (NOTE) The eGFR has been calculated using the CKD EPI equation. This calculation has not been validated in all clinical situations. eGFR's persistently <90 mL/min signify possible Chronic Kidney Disease.   . Anion gap 08/08/2014 11  5 - 15 Final  . WBC 08/08/2014 17.2* 4.0 - 10.5 K/uL Final  . RBC 08/08/2014 4.32  3.87 - 5.11 MIL/uL Final  . Hemoglobin 08/08/2014 11.1* 12.0 - 15.0 g/dL Final  . HCT 08/08/2014 32.0* 36.0 - 46.0 % Final  . MCV 08/08/2014 74.1* 78.0 - 100.0 fL Final  . MCH 08/08/2014 25.7* 26.0 - 34.0 pg Final  . MCHC 08/08/2014 34.7  30.0 - 36.0 g/dL Final  . RDW 08/08/2014 18.4* 11.5 - 15.5 % Final  . Platelets 08/08/2014 149* 150 - 400 K/uL  Final  . Neutrophils Relative % 08/08/2014 84* 43 - 77 % Final  . Lymphocytes Relative 08/08/2014 10* 12 - 46 % Final  . Monocytes Relative 08/08/2014 6  3 - 12 % Final  . Eosinophils Relative 08/08/2014 0  0 - 5 % Final  . Basophils Relative 08/08/2014 0  0 - 1 % Final  . Neutro Abs 08/08/2014 14.5* 1.7 - 7.7 K/uL Final  . Lymphs Abs 08/08/2014 1.7  0.7 - 4.0 K/uL Final  . Monocytes Absolute 08/08/2014 1.0  0.1 - 1.0 K/uL Final  . Eosinophils Absolute 08/08/2014 0.0  0.0 - 0.7 K/uL Final  . Basophils Absolute 08/08/2014 0.0  0.0 - 0.1 K/uL Final  . RBC Morphology 08/08/2014 POLYCHROMASIA PRESENT   Final   Comment: TARGET CELLS SCHISTOCYTES PRESENT (2-5/hpf)   . WBC Morphology 08/08/2014 VACUOLATED NEUTROPHILS   Final  . Smear Review 08/08/2014 LARGE PLATELETS PRESENT   Final  . Troponin I 08/08/2014 <0.03  <0.031 ng/mL Final   Comment:        NO INDICATION OF MYOCARDIAL INJURY.   . B Natriuretic Peptide 08/08/2014 161.7* 0.0 - 100.0 pg/mL Final  . Sodium 08/09/2014 144  135 - 145 mmol/L Final  . Potassium 08/09/2014 4.2  3.5 - 5.1 mmol/L Final  . Chloride 08/09/2014 107  96 - 112 mmol/L Final  . CO2 08/09/2014 26  19 - 32 mmol/L Final  . Glucose, Bld 08/09/2014 96  70 - 99 mg/dL Final  . BUN 08/09/2014 16  6 - 23 mg/dL Final  . Creatinine, Ser 08/09/2014 0.71  0.50 - 1.10 mg/dL Final  . Calcium 08/09/2014 8.8  8.4 - 10.5 mg/dL Final  . GFR calc non Af Amer 08/09/2014 84* >90 mL/min Final  . GFR calc Af Amer 08/09/2014 >90  >90 mL/min Final   Comment: (NOTE) The eGFR  has been calculated using the CKD EPI equation. This calculation has not been validated in all clinical situations. eGFR's persistently <90 mL/min signify possible Chronic Kidney Disease.   . Anion gap 08/09/2014 11  5 - 15 Final  . WBC 08/09/2014 13.0* 4.0 - 10.5 K/uL Final  . RBC 08/09/2014 4.10  3.87 - 5.11 MIL/uL Final  . Hemoglobin 08/09/2014 10.4* 12.0 - 15.0 g/dL Final  . HCT 08/09/2014 30.2* 36.0 - 46.0  % Final  . MCV 08/09/2014 73.7* 78.0 - 100.0 fL Final  . MCH 08/09/2014 25.4* 26.0 - 34.0 pg Final  . MCHC 08/09/2014 34.4  30.0 - 36.0 g/dL Final  . RDW 08/09/2014 18.3* 11.5 - 15.5 % Final  . Platelets 08/09/2014 174  150 - 400 K/uL Final  . MRSA by PCR 08/09/2014 NEGATIVE  NEGATIVE Final   Comment:        The GeneXpert MRSA Assay (FDA approved for NASAL specimens only), is one component of a comprehensive MRSA colonization surveillance program. It is not intended to diagnose MRSA infection nor to guide or monitor treatment for MRSA infections.   Marland Kitchen Specimen Description 08/09/2014 Pleural, L   Final  . Special Requests 08/09/2014 Normal   Final  . Gram Stain 08/09/2014    Final                   Value:WBC PRESENT, PREDOMINANTLY MONONUCLEAR NO ORGANISMS SEEN Performed at [facility]   . Culture 08/09/2014    Final                   Value:NO GROWTH 3 DAYS Performed at [facility]   . Report Status 08/09/2014 08/13/2014 FINAL   Final  . WBC 08/11/2014 14.3* 4.0 - 10.5 K/uL Final  . RBC 08/11/2014 4.01  3.87 - 5.11 MIL/uL Final  . Hemoglobin 08/11/2014 10.3* 12.0 - 15.0 g/dL Final  . HCT 08/11/2014 30.0* 36.0 - 46.0 % Final  . MCV 08/11/2014 74.8* 78.0 - 100.0 fL Final  . MCH 08/11/2014 25.7* 26.0 - 34.0 pg Final  . MCHC 08/11/2014 34.3  30.0 - 36.0 g/dL Final  . RDW 08/11/2014 18.4* 11.5 - 15.5 % Final  . Platelets 08/11/2014 148* 150 - 400 K/uL Final  . Neutrophils Relative % 08/11/2014 90* 43 - 77 % Final  . Neutro Abs 08/11/2014 12.9* 1.7 - 7.7 K/uL Final  . Lymphocytes Relative 08/11/2014 8* 12 - 46 % Final  . Lymphs Abs 08/11/2014 1.2  0.7 - 4.0 K/uL Final  . Monocytes Relative 08/11/2014 2* 3 - 12 % Final  . Monocytes Absolute 08/11/2014 0.3  0.1 - 1.0 K/uL Final  . Eosinophils Relative 08/11/2014 0  0 - 5 % Final  . Eosinophils Absolute 08/11/2014 0.0  0.0 - 0.7 K/uL Final  . Basophils Relative 08/11/2014 0  0 - 1 % Final  . Basophils Absolute  08/11/2014 0.0  0.0 - 0.1 K/uL Final  . Sodium 08/11/2014 141  135 - 145 mmol/L Final  . Potassium 08/11/2014 4.6  3.5 - 5.1 mmol/L Final  . Chloride 08/11/2014 104  96 - 112 mmol/L Final  . CO2 08/11/2014 25  19 - 32 mmol/L Final  . Glucose, Bld 08/11/2014 113* 70 - 99 mg/dL Final  . BUN 08/11/2014 25* 6 - 23 mg/dL Final  . Creatinine, Ser 08/11/2014 0.67  0.50 - 1.10 mg/dL Final  . Calcium 08/11/2014 8.3* 8.4 - 10.5 mg/dL Final  . Total Protein 08/11/2014 5.9* 6.0 -  8.3 g/dL Final  . Albumin 08/11/2014 2.5* 3.5 - 5.2 g/dL Final  . AST 08/11/2014 14  0 - 37 U/L Final  . ALT 08/11/2014 16  0 - 35 U/L Final  . Alkaline Phosphatase 08/11/2014 35* 39 - 117 U/L Final  . Total Bilirubin 08/11/2014 0.9  0.3 - 1.2 mg/dL Final  . GFR calc non Af Amer 08/11/2014 85* >90 mL/min Final  . GFR calc Af Amer 08/11/2014 >90  >90 mL/min Final   Comment: (NOTE) The eGFR has been calculated using the CKD EPI equation. This calculation has not been validated in all clinical situations. eGFR's persistently <90 mL/min signify possible Chronic Kidney Disease.   . Anion gap 08/11/2014 12  5 - 15 Final  . Sodium 08/13/2014 138  135 - 145 mmol/L Final  . Potassium 08/13/2014 5.0  3.5 - 5.1 mmol/L Final  . Chloride 08/13/2014 103  96 - 112 mmol/L Final  . CO2 08/13/2014 27  19 - 32 mmol/L Final  . Glucose, Bld 08/13/2014 125* 70 - 99 mg/dL Final  . BUN 08/13/2014 25* 6 - 23 mg/dL Final  . Creatinine, Ser 08/13/2014 0.84  0.50 - 1.10 mg/dL Final  . Calcium 08/13/2014 8.3* 8.4 - 10.5 mg/dL Final  . Total Protein 08/13/2014 6.4  6.0 - 8.3 g/dL Final  . Albumin 08/13/2014 2.7* 3.5 - 5.2 g/dL Final  . AST 08/13/2014 15  0 - 37 U/L Final  . ALT 08/13/2014 14  0 - 35 U/L Final  . Alkaline Phosphatase 08/13/2014 40  39 - 117 U/L Final  . Total Bilirubin 08/13/2014 1.1  0.3 - 1.2 mg/dL Final  . GFR calc non Af Amer 08/13/2014 67* >90 mL/min Final  . GFR calc Af Amer 08/13/2014 78* >90 mL/min Final   Comment:  (NOTE) The eGFR has been calculated using the CKD EPI equation. This calculation has not been validated in all clinical situations. eGFR's persistently <90 mL/min signify possible Chronic Kidney Disease.   . Anion gap 08/13/2014 8  5 - 15 Final  Admission on 08/01/2014, Discharged on 08/07/2014  No results displayed because visit has over 200 results.    Admission on 07/23/2014, Discharged on 07/23/2014  Component Date Value Ref Range Status  . WBC 07/23/2014 13.6* 4.0 - 10.5 K/uL Final  . RBC 07/23/2014 4.01  3.87 - 5.11 MIL/uL Final  . Hemoglobin 07/23/2014 10.2* 12.0 - 15.0 g/dL Final  . HCT 07/23/2014 30.1* 36.0 - 46.0 % Final  . MCV 07/23/2014 75.1* 78.0 - 100.0 fL Final  . MCH 07/23/2014 25.4* 26.0 - 34.0 pg Final  . MCHC 07/23/2014 33.9  30.0 - 36.0 g/dL Final  . RDW 07/23/2014 17.4* 11.5 - 15.5 % Final  . Platelets 07/23/2014 359  150 - 400 K/uL Final  . Neutrophils Relative % 07/23/2014 77  43 - 77 % Final  . Neutro Abs 07/23/2014 10.4* 1.7 - 7.7 K/uL Final  . Lymphocytes Relative 07/23/2014 9* 12 - 46 % Final  . Lymphs Abs 07/23/2014 1.2  0.7 - 4.0 K/uL Final  . Monocytes Relative 07/23/2014 12  3 - 12 % Final  . Monocytes Absolute 07/23/2014 1.6* 0.1 - 1.0 K/uL Final  . Eosinophils Relative 07/23/2014 2  0 - 5 % Final  . Eosinophils Absolute 07/23/2014 0.3  0.0 - 0.7 K/uL Final  . Basophils Relative 07/23/2014 0  0 - 1 % Final  . Basophils Absolute 07/23/2014 0.1  0.0 - 0.1 K/uL Final  . Sodium 07/23/2014 139  135 - 145 mmol/L Final  . Potassium 07/23/2014 3.4* 3.5 - 5.1 mmol/L Final  . Chloride 07/23/2014 96  96 - 112 mmol/L Final  . CO2 07/23/2014 30  19 - 32 mmol/L Final  . Glucose, Bld 07/23/2014 106* 70 - 99 mg/dL Final  . BUN 07/23/2014 8  6 - 23 mg/dL Final  . Creatinine, Ser 07/23/2014 0.67  0.50 - 1.10 mg/dL Final  . Calcium 07/23/2014 8.6  8.4 - 10.5 mg/dL Final  . GFR calc non Af Amer 07/23/2014 85* >90 mL/min Final  . GFR calc Af Amer 07/23/2014 >90  >90  mL/min Final   Comment: (NOTE) The eGFR has been calculated using the CKD EPI equation. This calculation has not been validated in all clinical situations. eGFR's persistently <90 mL/min signify possible Chronic Kidney Disease.   . Anion gap 07/23/2014 13  5 - 15 Final  . Color, Urine 07/23/2014 AMBER* YELLOW Final   BIOCHEMICALS MAY BE AFFECTED BY COLOR  . APPearance 07/23/2014 CLEAR  CLEAR Final  . Specific Gravity, Urine 07/23/2014 1.023  1.005 - 1.030 Final  . pH 07/23/2014 6.5  5.0 - 8.0 Final  . Glucose, UA 07/23/2014 NEGATIVE  NEGATIVE mg/dL Final  . Hgb urine dipstick 07/23/2014 NEGATIVE  NEGATIVE Final  . Bilirubin Urine 07/23/2014 MODERATE* NEGATIVE Final  . Ketones, ur 07/23/2014 40* NEGATIVE mg/dL Final  . Protein, ur 07/23/2014 NEGATIVE  NEGATIVE mg/dL Final  . Urobilinogen, UA 07/23/2014 4.0* 0.0 - 1.0 mg/dL Final  . Nitrite 07/23/2014 NEGATIVE  NEGATIVE Final  . Leukocytes, UA 07/23/2014 NEGATIVE  NEGATIVE Final   MICROSCOPIC NOT DONE ON URINES WITH NEGATIVE PROTEIN, BLOOD, LEUKOCYTES, NITRITE, OR GLUCOSE <1000 mg/dL.  Appointment on 07/22/2014  Component Date Value Ref Range Status  . WBC 07/22/2014 12.6* 3.9 - 10.3 10e3/uL Final  . NEUT# 07/22/2014 9.6* 1.5 - 6.5 10e3/uL Final  . HGB 07/22/2014 10.7* 11.6 - 15.9 g/dL Final  . HCT 07/22/2014 33.3* 34.8 - 46.6 % Final  . Platelets 07/22/2014 331  145 - 400 10e3/uL Final  . MCV 07/22/2014 77.4* 79.5 - 101.0 fL Final  . MCH 07/22/2014 24.9* 25.1 - 34.0 pg Final  . MCHC 07/22/2014 32.2  31.5 - 36.0 g/dL Final  . RBC 07/22/2014 4.30  3.70 - 5.45 10e6/uL Final  . RDW 07/22/2014 18.1* 11.2 - 14.5 % Final  . lymph# 07/22/2014 1.2  0.9 - 3.3 10e3/uL Final  . MONO# 07/22/2014 1.4* 0.1 - 0.9 10e3/uL Final  . Eosinophils Absolute 07/22/2014 0.3  0.0 - 0.5 10e3/uL Final  . Basophils Absolute 07/22/2014 0.1  0.0 - 0.1 10e3/uL Final  . NEUT% 07/22/2014 75.8  38.4 - 76.8 % Final  . LYMPH% 07/22/2014 9.6* 14.0 - 49.7 % Final  .  MONO% 07/22/2014 11.4  0.0 - 14.0 % Final  . EOS% 07/22/2014 2.3  0.0 - 7.0 % Final  . BASO% 07/22/2014 0.9  0.0 - 2.0 % Final  . Sodium 07/22/2014 138  136 - 145 mEq/L Final  . Potassium 07/22/2014 3.6  3.5 - 5.1 mEq/L Final  . Chloride 07/22/2014 97* 98 - 109 mEq/L Final  . CO2 07/22/2014 25  22 - 29 mEq/L Final  . Glucose 07/22/2014 107  70 - 140 mg/dl Final  . BUN 07/22/2014 7.0  7.0 - 26.0 mg/dL Final  . Creatinine 07/22/2014 0.8  0.6 - 1.1 mg/dL Final  . Total Bilirubin 07/22/2014 1.10  0.20 - 1.20 mg/dL Final  . Alkaline Phosphatase 07/22/2014 55  40 -  150 U/L Final  . AST 07/22/2014 15  5 - 34 U/L Final  . ALT 07/22/2014 7  0 - 55 U/L Final  . Total Protein 07/22/2014 7.3  6.4 - 8.3 g/dL Final  . Albumin 07/22/2014 2.3* 3.5 - 5.0 g/dL Final  . Calcium 07/22/2014 9.2  8.4 - 10.4 mg/dL Final  . Anion Gap 07/22/2014 16* 3 - 11 mEq/L Final  . EGFR 07/22/2014 89* >90 ml/min/1.73 m2 Final   eGFR is calculated using the CKD-EPI Creatinine Equation (2009)  . TSH 07/22/2014 0.646  0.308 - 3.960 m(IU)/L Final  Appointment on 07/19/2014  Component Date Value Ref Range Status  . WBC 07/19/2014 12.9* 3.9 - 10.3 10e3/uL Final  . NEUT# 07/19/2014 9.6* 1.5 - 6.5 10e3/uL Final  . HGB 07/19/2014 11.3* 11.6 - 15.9 g/dL Final  . HCT 07/19/2014 32.6* 34.8 - 46.6 % Final  . Platelets 07/19/2014 345  145 - 400 10e3/uL Final  . MCV 07/19/2014 74.8* 79.5 - 101.0 fL Final  . MCH 07/19/2014 25.9  25.1 - 34.0 pg Final  . MCHC 07/19/2014 34.7  31.5 - 36.0 g/dL Final  . RBC 07/19/2014 4.36  3.70 - 5.45 10e6/uL Final  . RDW 07/19/2014 17.4* 11.2 - 14.5 % Final  . lymph# 07/19/2014 1.3  0.9 - 3.3 10e3/uL Final  . MONO# 07/19/2014 1.6* 0.1 - 0.9 10e3/uL Final  . Eosinophils Absolute 07/19/2014 0.2  0.0 - 0.5 10e3/uL Final  . Basophils Absolute 07/19/2014 0.1  0.0 - 0.1 10e3/uL Final  . NEUT% 07/19/2014 74.7  38.4 - 76.8 % Final  . LYMPH% 07/19/2014 10.3* 14.0 - 49.7 % Final  . MONO% 07/19/2014 12.6   0.0 - 14.0 % Final  . EOS% 07/19/2014 1.9  0.0 - 7.0 % Final  . BASO% 07/19/2014 0.5  0.0 - 2.0 % Final  . nRBC 07/19/2014 0  0 - 0 % Final  . Sodium 07/19/2014 140  136 - 145 mEq/L Final  . Potassium 07/19/2014 3.0* 3.5 - 5.1 mEq/L Final  . Chloride 07/19/2014 97* 98 - 109 mEq/L Final  . CO2 07/19/2014 27  22 - 29 mEq/L Final  . Glucose 07/19/2014 110  70 - 140 mg/dl Final  . BUN 07/19/2014 8.3  7.0 - 26.0 mg/dL Final  . Creatinine 07/19/2014 0.7  0.6 - 1.1 mg/dL Final  . Total Bilirubin 07/19/2014 1.47* 0.20 - 1.20 mg/dL Final  . Alkaline Phosphatase 07/19/2014 59  40 - 150 U/L Final  . AST 07/19/2014 16  5 - 34 U/L Final  . ALT 07/19/2014 7  0 - 55 U/L Final  . Total Protein 07/19/2014 7.3  6.4 - 8.3 g/dL Final  . Albumin 07/19/2014 2.4* 3.5 - 5.0 g/dL Final  . Calcium 07/19/2014 9.2  8.4 - 10.4 mg/dL Final  . Anion Gap 07/19/2014 16* 3 - 11 mEq/L Final  . EGFR 07/19/2014 >90  >90 ml/min/1.73 m2 Final   eGFR is calculated using the CKD-EPI Creatinine Equation (2009)  . TSH 07/19/2014 0.473  0.308 - 3.960 m(IU)/L Final  Hospital Outpatient Visit on 07/18/2014  Component Date Value Ref Range Status  . Total protein, fluid 07/18/2014 4.4   Final   Comment: (NOTE) No normal range established for this test Results should be evaluated in conjunction with serum values Performed at [facility]   . Fluid Type-FTP 07/18/2014 Pleural, L   Final  . Fluid Type-FCT 07/18/2014 Pleural, L   Final  . Color, Fluid 07/18/2014 AMBER* YELLOW Final  .  Appearance, Fluid 07/18/2014 HAZY* CLEAR Final  . WBC, Fluid 07/18/2014 877  0 - 1000 cu mm Final  . Neutrophil Count, Fluid 07/18/2014 8  0 - 25 % Final  . Lymphs, Fluid 07/18/2014 60   Final  . Monocyte-Macrophage-Serous Fluid 07/18/2014 31* 50 - 90 % Final  . Eos, Fluid 07/18/2014 1   Final  . Other Cells, Fluid 07/18/2014 OTHER CELLS UNIDENTIFIED; SEE CYTOLOGY REPORT   Final  . Specimen Description 07/18/2014 PLEURAL LEFT   Final  .  Special Requests 07/18/2014 NONE   Final  . Gram Stain 07/18/2014    Final                   Value:RARE WBC PRESENT,BOTH PMN AND MONONUCLEAR NO ORGANISMS SEEN Performed at [facility]   . Culture 07/18/2014    Final                   Value:NO GROWTH 3 DAYS Performed at [facility]   . Report Status 07/18/2014 07/21/2014 FINAL   Final  . Glucose, Fluid 07/18/2014 80   Final   Comment: (NOTE) No normal range established for this test Results should be evaluated in conjunction with serum values Performed at [facility]   . Fluid Type-FGLU 07/18/2014 Pleural, L   Final  Appointment on 07/15/2014  Component Date Value Ref Range Status  . WBC 07/15/2014 11.6* 3.9 - 10.3 10e3/uL Final  . NEUT# 07/15/2014 9.1* 1.5 - 6.5 10e3/uL Final  . HGB 07/15/2014 10.9* 11.6 - 15.9 g/dL Final  . HCT 07/15/2014 31.5* 34.8 - 46.6 % Final  . Platelets 07/15/2014 403* 145 - 400 10e3/uL Final  . MCV 07/15/2014 74.6* 79.5 - 101.0 fL Final  . MCH 07/15/2014 25.8  25.1 - 34.0 pg Final  . MCHC 07/15/2014 34.6  31.5 - 36.0 g/dL Final  . RBC 07/15/2014 4.22  3.70 - 5.45 10e6/uL Final  . RDW 07/15/2014 17.2* 11.2 - 14.5 % Final  . lymph# 07/15/2014 0.8* 0.9 - 3.3 10e3/uL Final  . MONO# 07/15/2014 1.6* 0.1 - 0.9 10e3/uL Final  . Eosinophils Absolute 07/15/2014 0.1  0.0 - 0.5 10e3/uL Final  . Basophils Absolute 07/15/2014 0.0  0.0 - 0.1 10e3/uL Final  . NEUT% 07/15/2014 78.0* 38.4 - 76.8 % Final  . LYMPH% 07/15/2014 7.0* 14.0 - 49.7 % Final  . MONO% 07/15/2014 13.7  0.0 - 14.0 % Final  . EOS% 07/15/2014 1.0  0.0 - 7.0 % Final  . BASO% 07/15/2014 0.3  0.0 - 2.0 % Final  . nRBC 07/15/2014 0  0 - 0 % Final  . Sodium 07/15/2014 139  136 - 145 mEq/L Final  . Potassium 07/15/2014 2.7* 3.5 - 5.1 mEq/L Final  . Chloride 07/15/2014 93* 98 - 109 mEq/L Final  . CO2 07/15/2014 29  22 - 29 mEq/L Final  . Glucose 07/15/2014 111  70 - 140 mg/dl Final  . BUN 07/15/2014 9.1  7.0 - 26.0 mg/dL Final   . Creatinine 07/15/2014 0.7  0.6 - 1.1 mg/dL Final  . Total Bilirubin 07/15/2014 0.80  0.20 - 1.20 mg/dL Final  . Alkaline Phosphatase 07/15/2014 59  40 - 150 U/L Final  . AST 07/15/2014 11  5 - 34 U/L Final  . ALT 07/15/2014 8  0 - 55 U/L Final  . Total Protein 07/15/2014 7.3  6.4 - 8.3 g/dL Final  . Albumin 07/15/2014 2.4* 3.5 - 5.0 g/dL Final  . Calcium 07/15/2014 9.3  8.4 - 10.4 mg/dL Final  . Anion Gap 07/15/2014 17* 3 - 11 mEq/L Final  . EGFR 07/15/2014 >90  >90 ml/min/1.73 m2 Final   eGFR is calculated using the CKD-EPI Creatinine Equation (2009)  . Ferritin 07/15/2014 1,027* 9 - 269 ng/ml Final  . Iron 07/15/2014 13* 41 - 142 ug/dL Final  . TIBC 07/15/2014 161* 236 - 444 ug/dL Final  . UIBC 07/15/2014 148  120 - 384 ug/dL Final  . %SAT 07/15/2014 8* 21 - 57 % Final  Admission on 06/24/2014, Discharged on 06/27/2014  Component Date Value Ref Range Status  . Sodium 06/24/2014 140  135 - 145 mmol/L Final  . Potassium 06/24/2014 3.8  3.5 - 5.1 mmol/L Final  . Chloride 06/24/2014 100  96 - 112 mmol/L Final  . CO2 06/24/2014 30  19 - 32 mmol/L Final  . Glucose, Bld 06/24/2014 159* 70 - 99 mg/dL Final  . BUN 06/24/2014 10  6 - 23 mg/dL Final  . Creatinine, Ser 06/24/2014 0.85  0.50 - 1.10 mg/dL Final  . Calcium 06/24/2014 9.1  8.4 - 10.5 mg/dL Final  . Total Protein 06/24/2014 7.3  6.0 - 8.3 g/dL Final  . Albumin 06/24/2014 3.1* 3.5 - 5.2 g/dL Final  . AST 06/24/2014 16  0 - 37 U/L Final  . ALT 06/24/2014 29  0 - 35 U/L Final  . Alkaline Phosphatase 06/24/2014 110  39 - 117 U/L Final  . Total Bilirubin 06/24/2014 1.2  0.3 - 1.2 mg/dL Final  . GFR calc non Af Amer 06/24/2014 66* >90 mL/min Final  . GFR calc Af Amer 06/24/2014 77* >90 mL/min Final   Comment: (NOTE) The eGFR has been calculated using the CKD EPI equation. This calculation has not been validated in all clinical situations. eGFR's persistently <90 mL/min signify possible Chronic Kidney Disease.   . Anion gap  06/24/2014 10  5 - 15 Final  . Magnesium 06/24/2014 2.1  1.5 - 2.5 mg/dL Final  . Phosphorus 06/24/2014 2.9  2.3 - 4.6 mg/dL Final  . WBC 06/24/2014 9.9  4.0 - 10.5 K/uL Final  . RBC 06/24/2014 4.44  3.87 - 5.11 MIL/uL Final  . Hemoglobin 06/24/2014 11.5* 12.0 - 15.0 g/dL Final  . HCT 06/24/2014 33.2* 36.0 - 46.0 % Final  . MCV 06/24/2014 74.8* 78.0 - 100.0 fL Final  . MCH 06/24/2014 25.9* 26.0 - 34.0 pg Final  . MCHC 06/24/2014 34.6  30.0 - 36.0 g/dL Final  . RDW 06/24/2014 17.1* 11.5 - 15.5 % Final  . Platelets 06/24/2014 403* 150 - 400 K/uL Final  . aPTT 06/24/2014 37  24 - 37 seconds Final   Comment:        IF BASELINE aPTT IS ELEVATED, SUGGEST PATIENT RISK ASSESSMENT BE USED TO DETERMINE APPROPRIATE ANTICOAGULANT THERAPY.   . Prothrombin Time 06/24/2014 15.1  11.6 - 15.2 seconds Final  . INR 06/24/2014 1.18  0.00 - 1.49 Final  . Sodium 06/25/2014 139  135 - 145 mmol/L Final  . Potassium 06/25/2014 3.2* 3.5 - 5.1 mmol/L Final  . Chloride 06/25/2014 100  96 - 112 mmol/L Final  . CO2 06/25/2014 30  19 - 32 mmol/L Final  . Glucose, Bld 06/25/2014 126* 70 - 99 mg/dL Final  . BUN 06/25/2014 12  6 - 23 mg/dL Final  . Creatinine, Ser 06/25/2014 0.72  0.50 - 1.10 mg/dL Final  . Calcium 06/25/2014 8.4  8.4 - 10.5 mg/dL Final  . GFR calc non Af Amer 06/25/2014 83* >  90 mL/min Final  . GFR calc Af Amer 06/25/2014 >90  >90 mL/min Final   Comment: (NOTE) The eGFR has been calculated using the CKD EPI equation. This calculation has not been validated in all clinical situations. eGFR's persistently <90 mL/min signify possible Chronic Kidney Disease.   . Anion gap 06/25/2014 9  5 - 15 Final  . WBC 06/25/2014 9.9  4.0 - 10.5 K/uL Final  . RBC 06/25/2014 4.03  3.87 - 5.11 MIL/uL Final  . Hemoglobin 06/25/2014 10.3* 12.0 - 15.0 g/dL Final  . HCT 06/25/2014 30.3* 36.0 - 46.0 % Final  . MCV 06/25/2014 75.2* 78.0 - 100.0 fL Final  . MCH 06/25/2014 25.6* 26.0 - 34.0 pg Final  . MCHC  06/25/2014 34.0  30.0 - 36.0 g/dL Final  . RDW 06/25/2014 17.0* 11.5 - 15.5 % Final  . Platelets 06/25/2014 374  150 - 400 K/uL Final  . WBC 06/26/2014 8.9  4.0 - 10.5 K/uL Final  . RBC 06/26/2014 4.02  3.87 - 5.11 MIL/uL Final  . Hemoglobin 06/26/2014 10.3* 12.0 - 15.0 g/dL Final  . HCT 06/26/2014 30.3* 36.0 - 46.0 % Final  . MCV 06/26/2014 75.4* 78.0 - 100.0 fL Final  . MCH 06/26/2014 25.6* 26.0 - 34.0 pg Final  . MCHC 06/26/2014 34.0  30.0 - 36.0 g/dL Final  . RDW 06/26/2014 17.3* 11.5 - 15.5 % Final  . Platelets 06/26/2014 341  150 - 400 K/uL Final  . Sodium 06/26/2014 139  135 - 145 mmol/L Final  . Potassium 06/26/2014 3.1* 3.5 - 5.1 mmol/L Final  . Chloride 06/26/2014 105  96 - 112 mmol/L Final  . CO2 06/26/2014 31  19 - 32 mmol/L Final  . Glucose, Bld 06/26/2014 99  70 - 99 mg/dL Final  . BUN 06/26/2014 12  6 - 23 mg/dL Final  . Creatinine, Ser 06/26/2014 0.67  0.50 - 1.10 mg/dL Final  . Calcium 06/26/2014 8.2* 8.4 - 10.5 mg/dL Final  . GFR calc non Af Amer 06/26/2014 85* >90 mL/min Final  . GFR calc Af Amer 06/26/2014 >90  >90 mL/min Final   Comment: (NOTE) The eGFR has been calculated using the CKD EPI equation. This calculation has not been validated in all clinical situations. eGFR's persistently <90 mL/min signify possible Chronic Kidney Disease.   . Anion gap 06/26/2014 3* 5 - 15 Final  Appointment on 06/24/2014  Component Date Value Ref Range Status  . WBC 06/24/2014 9.7  3.9 - 10.3 10e3/uL Final  . NEUT# 06/24/2014 6.7* 1.5 - 6.5 10e3/uL Final  . HGB 06/24/2014 11.5* 11.6 - 15.9 g/dL Final  . HCT 06/24/2014 34.9  34.8 - 46.6 % Final  . Platelets 06/24/2014 390  145 - 400 10e3/uL Final  . MCV 06/24/2014 77.2* 79.5 - 101.0 fL Final  . MCH 06/24/2014 25.5  25.1 - 34.0 pg Final  . MCHC 06/24/2014 33.0  31.5 - 36.0 g/dL Final  . RBC 06/24/2014 4.52  3.70 - 5.45 10e6/uL Final  . RDW 06/24/2014 17.9* 11.2 - 14.5 % Final  . lymph# 06/24/2014 1.5  0.9 - 3.3 10e3/uL  Final  . MONO# 06/24/2014 1.0* 0.1 - 0.9 10e3/uL Final  . Eosinophils Absolute 06/24/2014 0.3  0.0 - 0.5 10e3/uL Final  . Basophils Absolute 06/24/2014 0.1  0.0 - 0.1 10e3/uL Final  . NEUT% 06/24/2014 69.0  38.4 - 76.8 % Final  . LYMPH% 06/24/2014 15.9  14.0 - 49.7 % Final  . MONO% 06/24/2014 10.4  0.0 - 14.0 %  Final  . EOS% 06/24/2014 3.5  0.0 - 7.0 % Final  . BASO% 06/24/2014 1.2  0.0 - 2.0 % Final  . Sodium 06/24/2014 143  136 - 145 mEq/L Final  . Potassium 06/24/2014 3.1* 3.5 - 5.1 mEq/L Final  . Chloride 06/24/2014 101  98 - 109 mEq/L Final  . CO2 06/24/2014 26  22 - 29 mEq/L Final  . Glucose 06/24/2014 90  70 - 140 mg/dl Final  . BUN 06/24/2014 7.3  7.0 - 26.0 mg/dL Final  . Creatinine 06/24/2014 0.7  0.6 - 1.1 mg/dL Final  . Total Bilirubin 06/24/2014 0.82  0.20 - 1.20 mg/dL Final  . Alkaline Phosphatase 06/24/2014 107  40 - 150 U/L Final  . AST 06/24/2014 13  5 - 34 U/L Final  . ALT 06/24/2014 31  0 - 55 U/L Final  . Total Protein 06/24/2014 6.7  6.4 - 8.3 g/dL Final  . Albumin 06/24/2014 2.8* 3.5 - 5.0 g/dL Final  . Calcium 06/24/2014 9.1  8.4 - 10.4 mg/dL Final  . Anion Gap 06/24/2014 15* 3 - 11 mEq/L Final  . EGFR 06/24/2014 >90  >90 ml/min/1.73 m2 Final   eGFR is calculated using the CKD-EPI Creatinine Equation (2009)  Appointment on 06/19/2014  Component Date Value Ref Range Status  . WBC 06/19/2014 9.0  3.9 - 10.3 10e3/uL Final  . NEUT# 06/19/2014 6.1  1.5 - 6.5 10e3/uL Final  . HGB 06/19/2014 11.8  11.6 - 15.9 g/dL Final  . HCT 06/19/2014 33.7* 34.8 - 46.6 % Final  . Platelets 06/19/2014 298  145 - 400 10e3/uL Final  . MCV 06/19/2014 74.6* 79.5 - 101.0 fL Final  . MCH 06/19/2014 26.1  25.1 - 34.0 pg Final  . MCHC 06/19/2014 35.0  31.5 - 36.0 g/dL Final  . RBC 06/19/2014 4.52  3.70 - 5.45 10e6/uL Final  . RDW 06/19/2014 16.8* 11.2 - 14.5 % Final  . lymph# 06/19/2014 1.7  0.9 - 3.3 10e3/uL Final  . MONO# 06/19/2014 1.0* 0.1 - 0.9 10e3/uL Final  . Eosinophils  Absolute 06/19/2014 0.2  0.0 - 0.5 10e3/uL Final  . Basophils Absolute 06/19/2014 0.0  0.0 - 0.1 10e3/uL Final  . NEUT% 06/19/2014 67.5  38.4 - 76.8 % Final  . LYMPH% 06/19/2014 18.6  14.0 - 49.7 % Final  . MONO% 06/19/2014 10.9  0.0 - 14.0 % Final  . EOS% 06/19/2014 2.6  0.0 - 7.0 % Final  . BASO% 06/19/2014 0.4  0.0 - 2.0 % Final  . Sodium 06/19/2014 141  136 - 145 mEq/L Final  . Potassium 06/19/2014 2.8* 3.5 - 5.1 mEq/L Final  . Chloride 06/19/2014 96* 98 - 109 mEq/L Final  . CO2 06/19/2014 29  22 - 29 mEq/L Final  . Glucose 06/19/2014 92  70 - 140 mg/dl Final  . BUN 06/19/2014 8.7  7.0 - 26.0 mg/dL Final  . Creatinine 06/19/2014 0.7  0.6 - 1.1 mg/dL Final  . Total Bilirubin 06/19/2014 1.03  0.20 - 1.20 mg/dL Final  . Alkaline Phosphatase 06/19/2014 189* 40 - 150 U/L Final  . AST 06/19/2014 20  5 - 34 U/L Final  . ALT 06/19/2014 100* 0 - 55 U/L Final  . Total Protein 06/19/2014 6.9  6.4 - 8.3 g/dL Final  . Albumin 06/19/2014 2.8* 3.5 - 5.0 g/dL Final  . Calcium 06/19/2014 9.1  8.4 - 10.4 mg/dL Final  . Anion Gap 06/19/2014 17* 3 - 11 mEq/L Final  . EGFR 06/19/2014 >90  >90 ml/min/1.73  m2 Final   eGFR is calculated using the CKD-EPI Creatinine Equation (2009)  There may be more visits with results that are not included.  Dg Chest 1 View  08/09/2014   CLINICAL DATA:  Post left thoracentesis  EXAM: CHEST  1 VIEW  COMPARISON:  08/08/2014  FINDINGS: Moderately large loculated left pleural effusion is similar to the prior study. Compressive atelectasis in the left lower lobe also unchanged. No pneumothorax post thoracentesis.  Right lung remains clear.  Negative for heart failure.  IMPRESSION: Negative for pneumothorax post left thoracentesis.   Electronically Signed   By: Franchot Gallo M.D.   On: 08/09/2014 17:12   Dg Chest 2 View  08/11/2014   CLINICAL DATA:  Weakness, known history of stage IV carcinoma  EXAM: CHEST  2 VIEW  COMPARISON:  08/09/2014  FINDINGS: The right lung is clear.  The cardiac shadow is stable. Persistent but slightly decreased left-sided pleural effusion is again noted. No new focal infiltrate is seen. No pneumothorax is noted.  IMPRESSION: Persistent but slightly improved left pleural effusion. Likely underlying consolidation is noted.   Electronically Signed   By: Inez Catalina M.D.   On: 08/11/2014 09:27   Dg Chest 2 View  08/01/2014   CLINICAL DATA:  Metastatic lung ca - cough - SOB - Pt resides at home. Pt c/o of SOB N/V. Symptoms started after returning home from here for MRI today  EXAM: CHEST - 2 VIEW  COMPARISON:  07/23/2014  FINDINGS: Moderate left pleural effusion. Consolidation/ atelectasis at the left lung base as before. Right lung clear. Heart size difficult to assess due to adjacent opacity. No pneumothorax Visualized skeletal structures are unremarkable.  IMPRESSION: 1. Stable moderate left pleural effusion and adjacent basilar consolidation/atelectasis.   Electronically Signed   By: Lucrezia Europe M.D.   On: 08/01/2014 20:10   Mr Jeri Cos DG Contrast  07/29/2014   CLINICAL DATA:  Metastatic lung cancer. Three month stereotactic radiosurgery follow-up  EXAM: MRI HEAD WITHOUT AND WITH CONTRAST  TECHNIQUE: Multiplanar, multiecho pulse sequences of the brain and surrounding structures were obtained without and with intravenous contrast.  CONTRAST:  16 mL MultiHance IV  COMPARISON:  MRI 05/01/2014  FINDINGS: Multiple metastatic deposits. Increase in edema in the left parietal lobe secondary to enlarging hemorrhagic metastatic deposit. Right parietal edema also has progressed somewhat. Ventricle size remains normal. No significant shift of the midline structures. No acute infarct.  Punctate calcification right cerebellum unchanged without enhancement.  3 mm enhancing nodule right superior cerebellum unchanged consistent with metastatic deposit.  2 mm enhancing nodule central pons unchanged.  19 x 20 mm hemorrhagic lesion left parietal lobe has increased in size  since the prior study. Increased surrounding white matter edema.  11 x 13 mm hemorrhagic enhancing lesion high right parietal lobe is similar in size. Slight increase in surrounding edema.  New enhancing lesion left frontal lobe over the convexity measuring 4 x 6.6 mm. This is consistent with metastatic disease and is new.  Calvarium is negative.  Paranasal sinuses clear  IMPRESSION: Multiple metastatic deposits are present as described above.  Hemorrhagic lesion left parietal lobe has increased in size with increase in surrounding edema.  New lesion over the left frontal convexity measuring 4 x 6.6 mm.  Right high parietal lesion is similar in size but slightly increased edema since the prior study. Other lesions are stable.   Electronically Signed   By: Franchot Gallo M.D.   On: 07/29/2014 15:06  Dg Chest Port 1 View  08/08/2014   CLINICAL DATA:  Shortness of breath before treatment.  EXAM: PORTABLE CHEST - 1 VIEW  COMPARISON:  08/01/2014  FINDINGS: Stable moderate left pleural effusion. There is likely associated mild compressive atelectasis. There is no other focal parenchymal opacity. There is no right pleural effusion. There is no pneumothorax. Stable cardiomediastinal silhouette. No acute osseous abnormality.  IMPRESSION: 1. Stable moderate left pleural effusion.   Electronically Signed   By: Kathreen Devoid   On: 08/08/2014 15:03   US Thoracentesis Asp Pleural Space W/img Guide  08/09/2014   INDICATION: Stage IV adenocarcinoma of lung, recurrent left pleural effusion; request is made for diagnostic and therapeutic left thoracentesis.  EXAM: ULTRASOUND GUIDED DIAGNOSTIC AND THERAPEUTIC LEFT THORACENTESIS  COMPARISON:  Prior thoracentesis on 07/18/2014  MEDICATIONS: None  COMPLICATIONS: None immediate  TECHNIQUE: Informed written consent was obtained from the patient after a discussion of the risks, benefits and alternatives to treatment. A timeout was performed prior to the initiation of the procedure.   Initial ultrasound scanning demonstrates a moderate left pleural effusion. The lower chest was prepped and draped in the usual sterile fashion. 1% lidocaine was used for local anesthesia.  An ultrasound image was saved for documentation purposes. An 6 Fr Safe-T-Centesis catheter was introduced. The thoracentesis was performed. The catheter was removed and a dressing was applied. The patient tolerated the procedure well without immediate post procedural complication. The patient was escorted to have an upright chest radiograph.  FINDINGS: A total of approximately 620 cc's of turbid, blood-tinged fluid was removed. Requested samples were sent to the laboratory. Only the above amount of fluid was removed at this time secondary to pt chest discomfort and increased coughing .  IMPRESSION: Successful ultrasound-guided diagnostic and therapeutic left sided thoracentesis yielding 620 cc's of pleural fluid.   Electronically Signed   By: Sandi Mariscal M.D.   On: 08/09/2014 17:39     Assessment/Plan   ICD-9-CM ICD-10-CM   1. Weakness generalized 780.79 R53.1   2. Protein-calorie malnutrition, severe 262 E43   3. 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 Essential hypertension - stable; cont med 401.9 I10   5. DVT (deep venous thrombosis), unspecified laterality - cont lovenox 453.40 I82.409   6. Anemia of chronic disease - due to #3 285.29 D63.8   7. Hypoxia - due to #3; cont O2 799.02 R09.02    --continue current medications as ordered. She refused pian meds and states her pain is due to cancer and cannot be treated with pain med  --palliative care to follow  --Pt/OT as tolerated  --nutritional supplement as ordered  --will follow. She has a poor prognosis.   Fiza Nation S. Perlie Gold  [facility] [address] [place], [place] [address] [phone] Office (Wednesdays and Fridays 8 AM - 5  PM) [phone] Cell (Monday-Friday 8 AM - 5 PM)

## 2014-08-30 ENCOUNTER — Other Ambulatory Visit: Payer: Self-pay | Admitting: Oncology

## 2014-08-30 ENCOUNTER — Encounter: Payer: Medicare Other | Admitting: Nutrition

## 2014-08-30 ENCOUNTER — Ambulatory Visit: Payer: Medicare Other | Admitting: Oncology

## 2014-08-30 ENCOUNTER — Other Ambulatory Visit: Payer: Medicare Other

## 2014-08-30 ENCOUNTER — Ambulatory Visit: Payer: Medicare Other

## 2014-09-02 ENCOUNTER — Telehealth: Payer: Self-pay | Admitting: *Deleted

## 2014-09-02 ENCOUNTER — Telehealth: Payer: Self-pay | Admitting: Hematology

## 2014-09-02 NOTE — Telephone Encounter (Signed)
Per staff message and POF I have scheduled appts. Advised scheduler of appts. JMW  

## 2014-09-02 NOTE — Telephone Encounter (Signed)
Spoke with [facility] [phone] and confirmed appointment for 04/15.

## 2014-09-06 ENCOUNTER — Other Ambulatory Visit (HOSPITAL_BASED_OUTPATIENT_CLINIC_OR_DEPARTMENT_OTHER): Payer: Medicare Other

## 2014-09-06 ENCOUNTER — Encounter: Payer: Self-pay | Admitting: Physician Assistant

## 2014-09-06 ENCOUNTER — Ambulatory Visit: Payer: Medicare Other

## 2014-09-06 ENCOUNTER — Other Ambulatory Visit (HOSPITAL_COMMUNITY)
Admission: RE | Admit: 2014-09-06 | Discharge: 2014-09-06 | Disposition: A | Discharge status: Home / Self Care | Payer: Medicare Other | Source: Ambulatory Visit | Attending: Hematology | Admitting: Hematology

## 2014-09-06 ENCOUNTER — Telehealth: Payer: Self-pay | Admitting: *Deleted

## 2014-09-06 ENCOUNTER — Ambulatory Visit (HOSPITAL_BASED_OUTPATIENT_CLINIC_OR_DEPARTMENT_OTHER): Payer: Medicare Other | Admitting: Physician Assistant

## 2014-09-06 VITALS — BP 87/58 | HR 87 | Temp 98.6°F | Resp 18

## 2014-09-06 DIAGNOSIS — C7931 Secondary malignant neoplasm of brain: Secondary | ICD-10-CM | POA: Insufficient documentation

## 2014-09-06 DIAGNOSIS — C3432 Malignant neoplasm of lower lobe, left bronchus or lung: Secondary | ICD-10-CM

## 2014-09-06 DIAGNOSIS — Z79899 Other long term (current) drug therapy: Secondary | ICD-10-CM

## 2014-09-06 DIAGNOSIS — R410 Disorientation, unspecified: Secondary | ICD-10-CM

## 2014-09-06 DIAGNOSIS — C3492 Malignant neoplasm of unspecified part of left bronchus or lung: Secondary | ICD-10-CM

## 2014-09-06 LAB — COMPREHENSIVE METABOLIC PANEL
ALT: 26 U/L (ref 0–35)
AST: 13 U/L (ref 0–37)
Albumin: 2.7 g/dL — ABNORMAL LOW (ref 3.5–5.2)
Alkaline Phosphatase: 55 U/L (ref 39–117)
Anion gap: 9 (ref 5–15)
BUN: 24 mg/dL — ABNORMAL HIGH (ref 6–23)
CALCIUM: 8.4 mg/dL (ref 8.4–10.5)
CO2: 25 mmol/L (ref 19–32)
Chloride: 104 mmol/L (ref 96–112)
Creatinine, Ser: 0.6 mg/dL (ref 0.50–1.10)
GFR calc Af Amer: >90 mL/min (ref >90)
GFR, EST NON AFRICAN AMERICAN: 88 mL/min — AB (ref >90)
GLUCOSE: 127 mg/dL — AB (ref 70–99)
Potassium: 3.4 mmol/L — ABNORMAL LOW (ref 3.5–5.1)
SODIUM: 138 mmol/L (ref 135–145)
TOTAL PROTEIN: 6 g/dL (ref 6.0–8.3)
Total Bilirubin: 0.6 mg/dL (ref 0.3–1.2)

## 2014-09-06 LAB — CBC WITH DIFFERENTIAL/PLATELET
BASO%: 0.4 % (ref 0.0–2.0)
Basophils Absolute: 0 10*3/uL (ref 0.0–0.1)
EOS%: 0.5 % (ref 0.0–7.0)
Eosinophils Absolute: 0 10*3/uL (ref 0.0–0.5)
HCT: 25.7 % — ABNORMAL LOW (ref 34.8–46.6)
HGB: 8.4 g/dL — ABNORMAL LOW (ref 11.6–15.9)
LYMPH#: 1.1 10*3/uL (ref 0.9–3.3)
LYMPH%: 18.6 % (ref 14.0–49.7)
MCH: 26.5 pg (ref 25.1–34.0)
MCHC: 32.6 g/dL (ref 31.5–36.0)
MCV: 81.1 fL (ref 79.5–101.0)
MONO#: 0.3 10*3/uL (ref 0.1–0.9)
MONO%: 4.4 % (ref 0.0–14.0)
NEUT#: 4.5 10*3/uL (ref 1.5–6.5)
NEUT%: 76.1 % (ref 38.4–76.8)
Platelets: 143 10*3/uL — ABNORMAL LOW (ref 145–400)
RBC: 3.17 10*6/uL — ABNORMAL LOW (ref 3.70–5.45)
RDW: 20.9 % — ABNORMAL HIGH (ref 11.2–14.5)
WBC: 5.9 10*3/uL (ref 3.9–10.3)

## 2014-09-06 LAB — TSH CHCC: TSH: 0.723 m[IU]/L (ref 0.308–3.960)

## 2014-09-06 NOTE — Progress Notes (Addendum)
[facility]  [phone] [phone] [place] Note   Patient Care Team: Christain Sacramento, MD as PCP - General (Family Medicine)   CHIEF COMPLAINTS Follow up  Oncology History   Lung cancer   Staging form: Lung, AJCC 7th Edition     Clinical: Stage IV (T3, N2, M1b) - Signed by Truitt Merle, MD on 05/23/2014       Lung cancer   04/19/2014 Imaging brain MRI showed a hemorrhagic mass 1.9cm in r oarital loibe and a 1.6cm lesion in left front lobe, with significant surrounding edema.    04/25/2014 Initial Diagnosis LLL lung adenocarcinoma, diagnosed through CT guided biopsy, TTF-1(+), CK5/6 (+), with metastases to b/l lungs, brain and liver. She presented wiht left side numbness and possible seizure.    04/30/2014 Imaging PET scan showed a 8cm hypermetabolic LLL lung mass, mediastinal adenopathy, multiple small lung nodules (non hypermetabolic) and a single hypermetabolic liver lesion in segment 3.    05/09/2014 -  Radiation Therapy SBRT to brain lesion    06/03/2014 - 06/24/2014 Chemotherapy first line carboplatin and alimta, stopped after 2 cycles due to disease progression   07/11/2014 Progression restaging CT showed mixed response, disease progression in liver, primary left lower lobe lung lesion smaller, loculated left pleural effusion. Clinically much more symptomatic.   MOLECULAR TESTING ([facility]) KRAS G12C ATRXK 1169 [id number] RICTOR amplification  TP53 A83P Negative for EGFR, ALK, MET, HER2, ROS-1, BRAF etc.   CURRENT THERAPY  Nivolumab started 07/22/14  INTERIM HISTORY She returns for follow up. She  Is status post one cycle of immunotherapy with Novolumab. She states she tolerated the first cycle without difficulty. She presents for cycle #2. She wants to continue with treatment as she is "not ready to give up". No fever or chill. Her son came to visit her last week and left yesterday.     MEDICAL HISTORY:  Past Medical  History  Diagnosis Date  . Hypertension   . Hyperlipidemia   . Knee injury 1983    left  . Lung cancer dx'd 03/2014  . Brain cancer   . Seizures     SURGICAL HISTORY: Past Surgical History  Procedure Laterality Date  . Video bronchoscopy Bilateral 04/19/2014    Procedure: VIDEO BRONCHOSCOPY WITHOUT FLUORO;  Surgeon: Rigoberto Noel, MD;  Location: [facility];  Service: Cardiopulmonary;  Laterality: Bilateral;  . Abdominal hysterectomy  1983    SOCIAL HISTORY: History   Social History  . Marital Status: Single    Spouse Name: N/A  . Number of Children: N/A  . Years of Education: N/A   Occupational History  . Not on file.   Social History Main Topics  . Smoking status: Former Research scientist (life sciences)  . Smokeless tobacco: Not on file  . Alcohol Use: Yes     Comment: 2 oz/week  . Drug Use: No  . Sexual Activity: Not Currently   Other Topics Concern  . Not on file   Social History Narrative    FAMILY HISTORY: Family History  Problem Relation Age of Onset  . Cancer Sister 20    unknown detail     ALLERGIES:  has No Known Allergies.  MEDICATIONS:  Current Outpatient Prescriptions  Medication Sig Dispense Refill  . atorvastatin (LIPITOR) 10 MG tablet Take 10 mg by mouth at bedtime.   0  . carvedilol (COREG) 25 MG tablet Take 1 tablet (25 mg total) by mouth 2 (two) times daily with a meal. 60  tablet 0  . cholecalciferol (VITAMIN D) 1000 UNITS tablet Take 1,000 Units by mouth daily.    Marland Kitchen dexamethasone (DECADRON) 4 MG tablet Take 1 tablet (4 mg total) by mouth 3 (three) times daily. TID for 3 days, then BID 1 week, then 2 mg bid 5 days, 2 mg daily 5days,then stop (Patient taking differently: Take by mouth. TID for 3 days, then BID 1 week, then 2 mg bid 5 days, 2 mg daily 5days,then stop) 60 tablet 2  . dronabinol (MARINOL) 2.5 MG capsule Take 1 capsule (2.5 mg total) by mouth 2 (two) times daily before a meal. 60 capsule 3  . enoxaparin (LOVENOX) 80 MG/0.8ML injection Inject 80 mg  into the skin every 12 (twelve) hours.   1  . feeding supplement, ENSURE COMPLETE, (ENSURE COMPLETE) LIQD Take 237 mLs by mouth 2 (two) times daily between meals.    . ferrous sulfate 325 (65 FE) MG tablet Take 1 tablet (325 mg total) by mouth 3 (three) times daily with meals. (Patient taking differently: Take 325 mg by mouth daily with breakfast. ) 90 tablet 3  . folic acid (FOLVITE) 1 MG tablet Take 1 mg by mouth daily.   3  . hydrochlorothiazide (HYDRODIURIL) 25 MG tablet Take 25 mg by mouth daily.    . polyethylene glycol (MIRALAX / GLYCOLAX) packet Take 17 g by mouth daily. 14 each 0  . potassium chloride SA (K-DUR,KLOR-CON) 20 MEQ tablet Take 1 tablet (20 mEq total) by mouth 2 (two) times daily. 60 tablet 1  . PRESCRIPTION MEDICATION Chemotherapy.    . levETIRAcetam (KEPPRA) 1000 MG tablet Take 1,000 mg by mouth 2 (two) times daily.  2  . mirtazapine (REMERON) 15 MG tablet Take 1 tablet (15 mg total) by mouth at bedtime. (Patient not taking: Reported on 09/06/2014) 30 tablet 2  . tuberculin 5 UNIT/0.1ML injection Inject 5 Units into the skin once.     No current facility-administered medications for this visit.    REVIEW OF SYSTEMS:   Constitutional: Denies fevers, chills or abnormal night sweats Eyes: Denies blurriness of vision, double vision or watery eyes Ears, nose, mouth, throat, and face: Denies mucositis or sore throat Respiratory: Denies cough, dyspnea or wheezes Cardiovascular: Denies palpitation, chest discomfort or lower extremity swelling Gastrointestinal:  Denies nausea, heartburn or change in bowel habits Skin: Denies abnormal skin rashes Lymphatics: Denies new lymphadenopathy or easy bruising Neurological: see HPI  Behavioral/Psych: Mood is stable, no new changes  All other systems were reviewed with the patient and are negative.  PHYSICAL EXAMINATION: ECOG PERFORMANCE STATUS: 3  Filed Vitals:   09/06/14 1018  BP: 87/58  Pulse: 87  Temp: 98.6 F (37 C)  Resp:  18   Filed Weights   O2sat 99% at rest   GENERAL:alert, no distress and comfortable, chronic heel appearing, sitting in wheelchair  SKIN: skin color, texture, turgor are normal, no rashes or significant lesions EYES: normal, conjunctiva are pink and non-injected, sclera clear OROPHARYNX:no exudate, no erythema and lips, buccal mucosa, and tongue normal  NECK: supple, thyroid normal size, non-tender, without nodularity LYMPH:  no palpable lymphadenopathy in the cervical, axillary or inguinal LUNGS: Diminished breath sound on the left side upper to mid chest, clear to auscultation and percussion with normal breathing effort on right HEART: regular rate & rhythm and no murmurs and no lower extremity edema ABDOMEN:abdomen soft, non-tender and normal bowel sounds Musculoskeletal:no cyanosis of digits and no clubbing  PSYCH: alert & oriented x 3 with fluent  speech NEURO: no focal motor/sensory deficits  LABORATORY DATA:  I have reviewed the data as listed CBC Latest Ref Rng 09/06/2014 08/11/2014 08/09/2014  WBC 3.9 - 10.3 10e3/uL 5.9 14.3(H) 13.0(H)  Hemoglobin 11.6 - 15.9 g/dL 8.4(L) 10.3(L) 10.4(L)  Hematocrit 34.8 - 46.6 % 25.7(L) 30.0(L) 30.2(L)  Platelets 145 - 400 10e3/uL 143(L) 148(L) 174    CMP Latest Ref Rng 09/06/2014 08/13/2014 08/11/2014  Glucose 70 - 99 mg/dL 127(H) 125(H) 113(H)  BUN 6 - 23 mg/dL 24(H) 25(H) 25(H)  Creatinine 0.50 - 1.10 mg/dL 0.60 0.84 0.67  Sodium 135 - 145 mmol/L 138 138 141  Potassium 3.5 - 5.1 mmol/L 3.4(L) 5.0 4.6  Chloride 96 - 112 mmol/L 104 103 104  CO2 19 - 32 mmol/L _0 Calcium 8.4 - 10.5 mg/dL 8.4 8.3(L) 8.3(L)  Total Protein 6.0 - 8.3 g/dL 6.0 6.4 5.9(L)  Total Bilirubin 0.3 - 1.2 mg/dL 0.6 1.1 0.9  Alkaline Phos 39 - 117 U/L 55 40 35(L)  AST 0 - 37 U/L _1 ALT 0 - 35 U/L _2 PATHOLOGY REPORT 04/25/2014 Lung, needle/core biopsy(ies), LLL - NON SMALL CELL CARCINOMA. Microscopic Comment Immunohistochemistry will be  performed and reported as an addendum. (JDP:gt, 04/26/14) ADDENDUM: Immunohistochemistry is performed and the tumor is positive with thyroid transcription factor-1 (TTF-1), and shows focal positivity with cytokeratin 5/6. The immunophenotype is consistent with primary lung adenocarcinoma. (JDP:gt, 04/29/14)    RADIOGRAPHIC STUDIES: I have personally reviewed the radiological images as listed and agreed with the findings in the report.  Mr Jeri Cos Wo Contrast 04/19/2014      IMPRESSION:  1. Hemorrhagic mass lesion in the high right parietal lobe measures 19 x 14 x 17 mm.  2. Enhancing mass lesion in the posterior left frontal lobe white matter measures 15 x 16 x 13 mm.  3. There is significant surrounding vasogenic edema with both lesions. These are most compatible with metastases.  4. Moderate generalized atrophy and diffuse white matter disease. This likely reflects the sequelae of chronic microvascular ischemia.     PET 04/30/2014 1. 8.0 x 4.7 cm pleural-based left lower lobe mass with satellite nodule in the left lower lobe, left hilar and mediastinal adenopathy (extending into the superior mediastinum), widespread disease in the left pleural space, and solitary hepatic metastasis. Findings are compatible with stage IV lung cancer. 2. Multiple other smaller pulmonary nodules are nonspecific, but unchanged compared to the prior examination. Metastatic disease to the lungs is not excluded, and continued attention on future followup studies is recommended. 3. 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 4. Additional incidental findings, as above.  CT chest, abdomen and pelvis with IV contrast on 07/12/2014 IMPRESSION: Mixed response.  Left lower lobe mass has decreased in size.  Associated mediastinal/left hilar lymphadenopathy is  mildly decreased. Moderate to large left pleural effusion, likely malignant, new/increased.  Suspected mild progression of hepatic metastases.  New 8 mm sclerotic lesion at L4, suspicious for osseous metastasis.  Additional ancillary findings as above.  ASSESSMENT & PLAN:  A 74 year old African-American female with past medical history of hypertension, dyslipidemia, have a smoking, who presents left-sided numbness and possible seizure episodes. A CT of chest reviewed a large left upper lobe lung mass, hilar and mediastinal adenopathy, and 2 brain lesions with significant surrounding edema. This findings are most consistent  with lung cancer with brain metastasis. CT-guided lung mass biopsy showed TTF-1 positive non-small cell carcinoma, consistent with primary lung adenocarcinoma. She was recently admitted for worsening dyspnea and tachycardia. She is status post thoracentesis with some improvement in her symptoms.  1. 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 the goal of treatment is palliation to prolong her life and preserve her quality of life. -Dr. Burr Medico discussed the [facility] result with her, unfortunately, there is no actionable mutations, and her tumor is KRAS (+)  -Her restaging CT scan results showed a mixed response, the primary lung lesion is smaller, however she did progress in the liver, also developed new left pleural effusion. Clinically, she deteriorated significantly, performance status is now 3, which is more consistent with disease progression. -She is now being treated with second line therapy with Nivolumab, status post 1 cycle. The patient was discussed with and also seen by Dr. Burr Medico. -She tolerated the first infusion of nivo well. Initially the plan was to continue immunotherapy treatment with Nivolumab however, the patient  became increasingly confused. Dr. Burr Medico will speak with the patient's family members about hospice care.  2. 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 [name].   3. HTN -Continue medication next  4. Social support, -She lives alone, has one son in [place]. She does have some support from her neighbors.   5. Hypokalemia -She will continue to take take potassium chloride 20 mEq twice daily.  6. Weight loss and malnutrition -Refer her to dietitian Raford Pitcher, she will given her some sample of boost and Ensure, and will meet and evaluate her when she comes for treatment -I encouraged her to take mirtazapine daily. She will try to once in the past.  Follow-up: as needed  All questions were answered. The patient knows to call the clinic with any problems, questions or concerns.       Awilda Metro E, PA-C  09/06/2014  Attending addendum: I have seen the patient, examined her. I agree with the assessment and and plan and have edited the notes.  I saw her last time in the hospital and discussed about hospice. Her condition had became worse since I saw her last time care. She is very cachetic, confused, could not tell my name, and disoriented. Given the overall extremely poor performance status, and terminal stage, I would not recommend any more treatment, I think she is appropriate for hospice. I did call her son today, but was not able to reach him. I spoke with her close friend [place] dated today, and discussed the  above. He states her son is coming to see her this weekend, and likely will bring her to [place] to be with her family and enroll her to hospice program. I tried calling her son also but was not able to reach him.   Truitt Merle  09/06/2014

## 2014-09-06 NOTE — Telephone Encounter (Signed)
Spoke with [facility] @ [facility], and was informed that pt is now residing at the facility permanently with insurance coverage.   Per Myriam Jacobson,  No POA name listed in pt's profile except son, cousin, and friend's names.  Informed Myriam Jacobson that per Dr. Burr Medico, pt can be transported back to the nursing center - no further chemo treatment due to pt is confused and weak.   Called son Michelle Cobb and left message on voice mail requesting a call back to office for Dr. Burr Medico to discuss pt's progress with son. Michelle Cobb's  Phone    [phone].

## 2014-09-10 ENCOUNTER — Non-Acute Institutional Stay (SKILLED_NURSING_FACILITY): Payer: Medicare Other | Admitting: Internal Medicine

## 2014-09-10 ENCOUNTER — Encounter: Payer: Self-pay | Admitting: Internal Medicine

## 2014-09-10 DIAGNOSIS — I10 Essential (primary) hypertension: Secondary | ICD-10-CM | POA: Diagnosis not present

## 2014-09-10 DIAGNOSIS — R0902 Hypoxemia: Secondary | ICD-10-CM | POA: Diagnosis not present

## 2014-09-10 DIAGNOSIS — G893 Neoplasm related pain (acute) (chronic): Secondary | ICD-10-CM | POA: Diagnosis not present

## 2014-09-10 DIAGNOSIS — E43 Unspecified severe protein-calorie malnutrition: Secondary | ICD-10-CM | POA: Diagnosis not present

## 2014-09-10 DIAGNOSIS — C3432 Malignant neoplasm of lower lobe, left bronchus or lung: Secondary | ICD-10-CM

## 2014-09-10 DIAGNOSIS — I82409 Acute embolism and thrombosis of unspecified deep veins of unspecified lower extremity: Secondary | ICD-10-CM

## 2014-09-10 NOTE — Progress Notes (Signed)
Patient ID: Michelle Cobb, female   DOB: [birth year], 74 y.o.   MRN: [id number]    [facility] of Service: [id number] [phone])  09/10/14  No Known Allergies  Chief Complaint  Patient presents with  . Acute Visit    increased anxiety, SOB, confusion and pain    HPI:  74 yo female seen today for acute visit. Nursing reports pt with increase anxiety, SOB, confusion and pain. 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 Pt is a poor historian due to change in [place]. Hx obtained from nursing  She takes coreg and HCTZ for HTN. BP has been elevated due to uncontrolled pain  She takes decadron to control brain swelling due to mets.  No seizures on keppra  She takes lovenox for DVT  Past Medical History  Diagnosis Date  . Hypertension   . Hyperlipidemia   . Knee injury 1983    left  . Lung cancer dx'd 03/2014  . Brain cancer   . Seizures    Past Surgical History  Procedure Laterality Date  . Video bronchoscopy Bilateral 04/19/2014    Procedure: VIDEO BRONCHOSCOPY WITHOUT FLUORO;  Surgeon: Rigoberto Noel, MD;  Location: [facility];  Service: Cardiopulmonary;  Laterality: Bilateral;  . Abdominal hysterectomy  1983   History   Social History  . Marital Status: Single    Spouse Name: N/A  . Number of Children: N/A  . Years of Education: N/A   Social History Main Topics  . Smoking status: Former Research scientist (life sciences)  . Smokeless tobacco: Not on file  . Alcohol Use: Yes     Comment: 2 oz/week  . Drug Use: No  . Sexual Activity: Not Currently   Other Topics Concern  . None   Social History Narrative     Medications: Patient's Medications  New Prescriptions   No medications on file  Previous Medications   ATORVASTATIN (LIPITOR) 10 MG TABLET    Take 10 mg by mouth at bedtime.    CARVEDILOL (COREG) 25 MG TABLET    Take 1 tablet (25 mg total) by mouth 2 (two) times daily with a meal.   CHOLECALCIFEROL (VITAMIN D) 1000 UNITS TABLET     Take 1,000 Units by mouth daily.   DEXAMETHASONE (DECADRON) 4 MG TABLET    Take 1 tablet (4 mg total) by mouth 3 (three) times daily. TID for 3 days, then BID 1 week, then 2 mg bid 5 days, 2 mg daily 5days,then stop   DRONABINOL (MARINOL) 2.5 MG CAPSULE    Take 1 capsule (2.5 mg total) by mouth 2 (two) times daily before a meal.   ENOXAPARIN (LOVENOX) 80 MG/0.8ML INJECTION    Inject 80 mg into the skin every 12 (twelve) hours.    FEEDING SUPPLEMENT, ENSURE COMPLETE, (ENSURE COMPLETE) LIQD    Take 237 mLs by mouth 2 (two) times daily between meals.   FERROUS SULFATE 325 (65 FE) MG TABLET    Take 1 tablet (325 mg total) by mouth 3 (three) times daily with meals.   FOLIC ACID (FOLVITE) 1 MG TABLET    Take 1 mg by mouth daily.    HYDROCHLOROTHIAZIDE (HYDRODIURIL) 25 MG TABLET    Take 25 mg by mouth daily.   LEVETIRACETAM (KEPPRA) 1000 MG TABLET    Take 1,000 mg by mouth 2 (two) times daily.   MIRTAZAPINE (REMERON) 15 MG TABLET    Take 1 tablet (15  mg total) by mouth at bedtime.   POLYETHYLENE GLYCOL (MIRALAX / GLYCOLAX) PACKET    Take 17 g by mouth daily.   POTASSIUM CHLORIDE SA (K-DUR,KLOR-CON) 20 MEQ TABLET    Take 1 tablet (20 mEq total) by mouth 2 (two) times daily.   PRESCRIPTION MEDICATION    Chemotherapy.   TUBERCULIN 5 UNIT/0.1ML INJECTION    Inject 5 Units into the skin once.  Modified Medications   No medications on file  Discontinued Medications   No medications on file     Review of Systems  Unable to perform ROS: Mental status change    Filed Vitals:   09/10/14 1555  BP: 160/72  Pulse: 70  Temp: 97.4 F (36.3 C)  Weight: 162 lb (73.483 kg) (down 21 lbs since March 2016)  SpO2: 94%   Body mass index is 24.98 kg/(m^2).  Physical Exam  Constitutional: She appears cachectic. She has a sickly appearance. No distress.  Looks pale  Cardiovascular: Normal rate, regular rhythm and intact distal pulses.  Exam reveals friction rub. Exam reveals no gallop.   No murmur  heard. No LE edema. No calf TTP  Pulmonary/Chest: No accessory muscle usage. No respiratory distress. She has decreased breath sounds. She has no wheezes. She has no rhonchi. She has no rales.  Musculoskeletal: She exhibits tenderness.  Neurological: She is alert.  Skin: Skin is warm and dry. No rash noted.  Psychiatric: Her mood appears anxious. Her speech is rapid and/or pressured. She is not agitated.     Labs reviewed: Hospital Outpatient Visit on 09/06/2014  Component Date Value Ref Range Status  . Sodium 09/06/2014 138  135 - 145 mmol/L Final  . Potassium 09/06/2014 3.4* 3.5 - 5.1 mmol/L Final  . Chloride 09/06/2014 104  96 - 112 mmol/L Final  . CO2 09/06/2014 25  19 - 32 mmol/L Final  . Glucose, Bld 09/06/2014 127* 70 - 99 mg/dL Final  . BUN 09/06/2014 24* 6 - 23 mg/dL Final  . Creatinine, Ser 09/06/2014 0.60  0.50 - 1.10 mg/dL Final  . Calcium 09/06/2014 8.4  8.4 - 10.5 mg/dL Final  . Total Protein 09/06/2014 6.0  6.0 - 8.3 g/dL Final  . Albumin 09/06/2014 2.7* 3.5 - 5.2 g/dL Final  . AST 09/06/2014 13  0 - 37 U/L Final  . ALT 09/06/2014 26  0 - 35 U/L Final  . Alkaline Phosphatase 09/06/2014 55  39 - 117 U/L Final  . Total Bilirubin 09/06/2014 0.6  0.3 - 1.2 mg/dL Final  . GFR calc non Af Amer 09/06/2014 88* >90 mL/min Final  . GFR calc Af Amer 09/06/2014 >90  >90 mL/min Final   Comment: (NOTE) The eGFR has been calculated using the CKD EPI equation. This calculation has not been validated in all clinical situations. eGFR's persistently <90 mL/min signify possible Chronic Kidney Disease.   . Anion gap 09/06/2014 9  5 - 15 Final  Appointment on 09/06/2014  Component Date Value Ref Range Status  . WBC 09/06/2014 5.9  3.9 - 10.3 10e3/uL Final  . NEUT# 09/06/2014 4.5  1.5 - 6.5 10e3/uL Final  . HGB 09/06/2014 8.4* 11.6 - 15.9 g/dL Final  . HCT 09/06/2014 25.7* 34.8 - 46.6 % Final  . Platelets 09/06/2014 143* 145 - 400 10e3/uL Final  . MCV 09/06/2014 81.1  79.5 - 101.0  fL Final  . MCH 09/06/2014 26.5  25.1 - 34.0 pg Final  . MCHC 09/06/2014 32.6  31.5 - 36.0 g/dL Final  .  RBC 09/06/2014 3.17* 3.70 - 5.45 10e6/uL Final  . RDW 09/06/2014 20.9* 11.2 - 14.5 % Final  . lymph# 09/06/2014 1.1  0.9 - 3.3 10e3/uL Final  . MONO# 09/06/2014 0.3  0.1 - 0.9 10e3/uL Final  . Eosinophils Absolute 09/06/2014 0.0  0.0 - 0.5 10e3/uL Final  . Basophils Absolute 09/06/2014 0.0  0.0 - 0.1 10e3/uL Final  . NEUT% 09/06/2014 76.1  38.4 - 76.8 % Final  . LYMPH% 09/06/2014 18.6  14.0 - 49.7 % Final  . MONO% 09/06/2014 4.4  0.0 - 14.0 % Final  . EOS% 09/06/2014 0.5  0.0 - 7.0 % Final  . BASO% 09/06/2014 0.4  0.0 - 2.0 % Final  . TSH 09/06/2014 0.723  0.308 - 3.960 m(IU)/L Final  Admission on 08/08/2014, Discharged on 08/13/2014  Component Date Value Ref Range Status  . Sodium 08/08/2014 143  135 - 145 mmol/L Final  . Potassium 08/08/2014 4.3  3.5 - 5.1 mmol/L Final  . Chloride 08/08/2014 111  96 - 112 mmol/L Final  . CO2 08/08/2014 21  19 - 32 mmol/L Final  . Glucose, Bld 08/08/2014 79  70 - 99 mg/dL Final  . BUN 08/08/2014 16  6 - 23 mg/dL Final  . Creatinine, Ser 08/08/2014 0.69  0.50 - 1.10 mg/dL Final  . Calcium 08/08/2014 8.4  8.4 - 10.5 mg/dL Final  . Total Protein 08/08/2014 6.1  6.0 - 8.3 g/dL Final  . Albumin 08/08/2014 2.4* 3.5 - 5.2 g/dL Final  . AST 08/08/2014 18  0 - 37 U/L Final  . ALT 08/08/2014 15  0 - 35 U/L Final  . Alkaline Phosphatase 08/08/2014 40  39 - 117 U/L Final  . Total Bilirubin 08/08/2014 1.0  0.3 - 1.2 mg/dL Final  . GFR calc non Af Amer 08/08/2014 84* >90 mL/min Final  . GFR calc Af Amer 08/08/2014 >90  >90 mL/min Final   Comment: (NOTE) The eGFR has been calculated using the CKD EPI equation. This calculation has not been validated in all clinical situations. eGFR's persistently <90 mL/min signify possible Chronic Kidney Disease.   . Anion gap 08/08/2014 11  5 - 15 Final  . WBC 08/08/2014 17.2* 4.0 - 10.5 K/uL Final  . RBC 08/08/2014  4.32  3.87 - 5.11 MIL/uL Final  . Hemoglobin 08/08/2014 11.1* 12.0 - 15.0 g/dL Final  . HCT 08/08/2014 32.0* 36.0 - 46.0 % Final  . MCV 08/08/2014 74.1* 78.0 - 100.0 fL Final  . MCH 08/08/2014 25.7* 26.0 - 34.0 pg Final  . MCHC 08/08/2014 34.7  30.0 - 36.0 g/dL Final  . RDW 08/08/2014 18.4* 11.5 - 15.5 % Final  . Platelets 08/08/2014 149* 150 - 400 K/uL Final  . Neutrophils Relative % 08/08/2014 84* 43 - 77 % Final  . Lymphocytes Relative 08/08/2014 10* 12 - 46 % Final  . Monocytes Relative 08/08/2014 6  3 - 12 % Final  . Eosinophils Relative 08/08/2014 0  0 - 5 % Final  . Basophils Relative 08/08/2014 0  0 - 1 % Final  . Neutro Abs 08/08/2014 14.5* 1.7 - 7.7 K/uL Final  . Lymphs Abs 08/08/2014 1.7  0.7 - 4.0 K/uL Final  . Monocytes Absolute 08/08/2014 1.0  0.1 - 1.0 K/uL Final  . Eosinophils Absolute 08/08/2014 0.0  0.0 - 0.7 K/uL Final  . Basophils Absolute 08/08/2014 0.0  0.0 - 0.1 K/uL Final  . RBC Morphology 08/08/2014 POLYCHROMASIA PRESENT   Final   Comment: TARGET CELLS SCHISTOCYTES PRESENT (2-5/hpf)   .  WBC Morphology 08/08/2014 VACUOLATED NEUTROPHILS   Final  . Smear Review 08/08/2014 LARGE PLATELETS PRESENT   Final  . Troponin I 08/08/2014 <0.03  <0.031 ng/mL Final   Comment:        NO INDICATION OF MYOCARDIAL INJURY.   . B Natriuretic Peptide 08/08/2014 161.7* 0.0 - 100.0 pg/mL Final  . Sodium 08/09/2014 144  135 - 145 mmol/L Final  . Potassium 08/09/2014 4.2  3.5 - 5.1 mmol/L Final  . Chloride 08/09/2014 107  96 - 112 mmol/L Final  . CO2 08/09/2014 26  19 - 32 mmol/L Final  . Glucose, Bld 08/09/2014 96  70 - 99 mg/dL Final  . BUN 08/09/2014 16  6 - 23 mg/dL Final  . Creatinine, Ser 08/09/2014 0.71  0.50 - 1.10 mg/dL Final  . Calcium 08/09/2014 8.8  8.4 - 10.5 mg/dL Final  . GFR calc non Af Amer 08/09/2014 84* >90 mL/min Final  . GFR calc Af Amer 08/09/2014 >90  >90 mL/min Final   Comment: (NOTE) The eGFR has been calculated using the CKD EPI equation. This  calculation has not been validated in all clinical situations. eGFR's persistently <90 mL/min signify possible Chronic Kidney Disease.   . Anion gap 08/09/2014 11  5 - 15 Final  . WBC 08/09/2014 13.0* 4.0 - 10.5 K/uL Final  . RBC 08/09/2014 4.10  3.87 - 5.11 MIL/uL Final  . Hemoglobin 08/09/2014 10.4* 12.0 - 15.0 g/dL Final  . HCT 08/09/2014 30.2* 36.0 - 46.0 % Final  . MCV 08/09/2014 73.7* 78.0 - 100.0 fL Final  . MCH 08/09/2014 25.4* 26.0 - 34.0 pg Final  . MCHC 08/09/2014 34.4  30.0 - 36.0 g/dL Final  . RDW 08/09/2014 18.3* 11.5 - 15.5 % Final  . Platelets 08/09/2014 174  150 - 400 K/uL Final  . MRSA by PCR 08/09/2014 NEGATIVE  NEGATIVE Final   Comment:        The GeneXpert MRSA Assay (FDA approved for NASAL specimens only), is one component of a comprehensive MRSA colonization surveillance program. It is not intended to diagnose MRSA infection nor to guide or monitor treatment for MRSA infections.   Marland Kitchen Specimen Description 08/09/2014 Pleural, L   Final  . Special Requests 08/09/2014 Normal   Final  . Gram Stain 08/09/2014    Final                   Value:WBC PRESENT, PREDOMINANTLY MONONUCLEAR NO ORGANISMS SEEN Performed at [facility]   . Culture 08/09/2014    Final                   Value:NO GROWTH 3 DAYS Performed at [facility]   . Report Status 08/09/2014 08/13/2014 FINAL   Final  . WBC 08/11/2014 14.3* 4.0 - 10.5 K/uL Final  . RBC 08/11/2014 4.01  3.87 - 5.11 MIL/uL Final  . Hemoglobin 08/11/2014 10.3* 12.0 - 15.0 g/dL Final  . HCT 08/11/2014 30.0* 36.0 - 46.0 % Final  . MCV 08/11/2014 74.8* 78.0 - 100.0 fL Final  . MCH 08/11/2014 25.7* 26.0 - 34.0 pg Final  . MCHC 08/11/2014 34.3  30.0 - 36.0 g/dL Final  . RDW 08/11/2014 18.4* 11.5 - 15.5 % Final  . Platelets 08/11/2014 148* 150 - 400 K/uL Final  . Neutrophils Relative % 08/11/2014 90* 43 - 77 % Final  . Neutro Abs 08/11/2014 12.9* 1.7 - 7.7 K/uL Final  . Lymphocytes Relative 08/11/2014 8*  12 - 46 % Final  .  Lymphs Abs 08/11/2014 1.2  0.7 - 4.0 K/uL Final  . Monocytes Relative 08/11/2014 2* 3 - 12 % Final  . Monocytes Absolute 08/11/2014 0.3  0.1 - 1.0 K/uL Final  . Eosinophils Relative 08/11/2014 0  0 - 5 % Final  . Eosinophils Absolute 08/11/2014 0.0  0.0 - 0.7 K/uL Final  . Basophils Relative 08/11/2014 0  0 - 1 % Final  . Basophils Absolute 08/11/2014 0.0  0.0 - 0.1 K/uL Final  . Sodium 08/11/2014 141  135 - 145 mmol/L Final  . Potassium 08/11/2014 4.6  3.5 - 5.1 mmol/L Final  . Chloride 08/11/2014 104  96 - 112 mmol/L Final  . CO2 08/11/2014 25  19 - 32 mmol/L Final  . Glucose, Bld 08/11/2014 113* 70 - 99 mg/dL Final  . BUN 08/11/2014 25* 6 - 23 mg/dL Final  . Creatinine, Ser 08/11/2014 0.67  0.50 - 1.10 mg/dL Final  . Calcium 08/11/2014 8.3* 8.4 - 10.5 mg/dL Final  . Total Protein 08/11/2014 5.9* 6.0 - 8.3 g/dL Final  . Albumin 08/11/2014 2.5* 3.5 - 5.2 g/dL Final  . AST 08/11/2014 14  0 - 37 U/L Final  . ALT 08/11/2014 16  0 - 35 U/L Final  . Alkaline Phosphatase 08/11/2014 35* 39 - 117 U/L Final  . Total Bilirubin 08/11/2014 0.9  0.3 - 1.2 mg/dL Final  . GFR calc non Af Amer 08/11/2014 85* >90 mL/min Final  . GFR calc Af Amer 08/11/2014 >90  >90 mL/min Final   Comment: (NOTE) The eGFR has been calculated using the CKD EPI equation. This calculation has not been validated in all clinical situations. eGFR's persistently <90 mL/min signify possible Chronic Kidney Disease.   . Anion gap 08/11/2014 12  5 - 15 Final  . Sodium 08/13/2014 138  135 - 145 mmol/L Final  . Potassium 08/13/2014 5.0  3.5 - 5.1 mmol/L Final  . Chloride 08/13/2014 103  96 - 112 mmol/L Final  . CO2 08/13/2014 27  19 - 32 mmol/L Final  . Glucose, Bld 08/13/2014 125* 70 - 99 mg/dL Final  . BUN 08/13/2014 25* 6 - 23 mg/dL Final  . Creatinine, Ser 08/13/2014 0.84  0.50 - 1.10 mg/dL Final  . Calcium 08/13/2014 8.3* 8.4 - 10.5 mg/dL Final  . Total Protein 08/13/2014 6.4  6.0 - 8.3 g/dL Final   . Albumin 08/13/2014 2.7* 3.5 - 5.2 g/dL Final  . AST 08/13/2014 15  0 - 37 U/L Final  . ALT 08/13/2014 14  0 - 35 U/L Final  . Alkaline Phosphatase 08/13/2014 40  39 - 117 U/L Final  . Total Bilirubin 08/13/2014 1.1  0.3 - 1.2 mg/dL Final  . GFR calc non Af Amer 08/13/2014 67* >90 mL/min Final  . GFR calc Af Amer 08/13/2014 78* >90 mL/min Final   Comment: (NOTE) The eGFR has been calculated using the CKD EPI equation. This calculation has not been validated in all clinical situations. eGFR's persistently <90 mL/min signify possible Chronic Kidney Disease.   . Anion gap 08/13/2014 8  5 - 15 Final  Admission on 08/01/2014, Discharged on 08/07/2014  No results displayed because visit has over 200 results.    Admission on 07/23/2014, Discharged on 07/23/2014  Component Date Value Ref Range Status  . WBC 07/23/2014 13.6* 4.0 - 10.5 K/uL Final  . RBC 07/23/2014 4.01  3.87 - 5.11 MIL/uL Final  . Hemoglobin 07/23/2014 10.2* 12.0 - 15.0 g/dL Final  . HCT 07/23/2014 30.1* 36.0 - 46.0 %  Final  . MCV 07/23/2014 75.1* 78.0 - 100.0 fL Final  . MCH 07/23/2014 25.4* 26.0 - 34.0 pg Final  . MCHC 07/23/2014 33.9  30.0 - 36.0 g/dL Final  . RDW 07/23/2014 17.4* 11.5 - 15.5 % Final  . Platelets 07/23/2014 359  150 - 400 K/uL Final  . Neutrophils Relative % 07/23/2014 77  43 - 77 % Final  . Neutro Abs 07/23/2014 10.4* 1.7 - 7.7 K/uL Final  . Lymphocytes Relative 07/23/2014 9* 12 - 46 % Final  . Lymphs Abs 07/23/2014 1.2  0.7 - 4.0 K/uL Final  . Monocytes Relative 07/23/2014 12  3 - 12 % Final  . Monocytes Absolute 07/23/2014 1.6* 0.1 - 1.0 K/uL Final  . Eosinophils Relative 07/23/2014 2  0 - 5 % Final  . Eosinophils Absolute 07/23/2014 0.3  0.0 - 0.7 K/uL Final  . Basophils Relative 07/23/2014 0  0 - 1 % Final  . Basophils Absolute 07/23/2014 0.1  0.0 - 0.1 K/uL Final  . Sodium 07/23/2014 139  135 - 145 mmol/L Final  . Potassium 07/23/2014 3.4* 3.5 - 5.1 mmol/L Final  . Chloride 07/23/2014 96   96 - 112 mmol/L Final  . CO2 07/23/2014 30  19 - 32 mmol/L Final  . Glucose, Bld 07/23/2014 106* 70 - 99 mg/dL Final  . BUN 07/23/2014 8  6 - 23 mg/dL Final  . Creatinine, Ser 07/23/2014 0.67  0.50 - 1.10 mg/dL Final  . Calcium 07/23/2014 8.6  8.4 - 10.5 mg/dL Final  . GFR calc non Af Amer 07/23/2014 85* >90 mL/min Final  . GFR calc Af Amer 07/23/2014 >90  >90 mL/min Final   Comment: (NOTE) The eGFR has been calculated using the CKD EPI equation. This calculation has not been validated in all clinical situations. eGFR's persistently <90 mL/min signify possible Chronic Kidney Disease.   . Anion gap 07/23/2014 13  5 - 15 Final  . Color, Urine 07/23/2014 AMBER* YELLOW Final   BIOCHEMICALS MAY BE AFFECTED BY COLOR  . APPearance 07/23/2014 CLEAR  CLEAR Final  . Specific Gravity, Urine 07/23/2014 1.023  1.005 - 1.030 Final  . pH 07/23/2014 6.5  5.0 - 8.0 Final  . Glucose, UA 07/23/2014 NEGATIVE  NEGATIVE mg/dL Final  . Hgb urine dipstick 07/23/2014 NEGATIVE  NEGATIVE Final  . Bilirubin Urine 07/23/2014 MODERATE* NEGATIVE Final  . Ketones, ur 07/23/2014 40* NEGATIVE mg/dL Final  . Protein, ur 07/23/2014 NEGATIVE  NEGATIVE mg/dL Final  . Urobilinogen, UA 07/23/2014 4.0* 0.0 - 1.0 mg/dL Final  . Nitrite 07/23/2014 NEGATIVE  NEGATIVE Final  . Leukocytes, UA 07/23/2014 NEGATIVE  NEGATIVE Final   MICROSCOPIC NOT DONE ON URINES WITH NEGATIVE PROTEIN, BLOOD, LEUKOCYTES, NITRITE, OR GLUCOSE <1000 mg/dL.  Appointment on 07/22/2014  Component Date Value Ref Range Status  . WBC 07/22/2014 12.6* 3.9 - 10.3 10e3/uL Final  . NEUT# 07/22/2014 9.6* 1.5 - 6.5 10e3/uL Final  . HGB 07/22/2014 10.7* 11.6 - 15.9 g/dL Final  . HCT 07/22/2014 33.3* 34.8 - 46.6 % Final  . Platelets 07/22/2014 331  145 - 400 10e3/uL Final  . MCV 07/22/2014 77.4* 79.5 - 101.0 fL Final  . MCH 07/22/2014 24.9* 25.1 - 34.0 pg Final  . MCHC 07/22/2014 32.2  31.5 - 36.0 g/dL Final  . RBC 07/22/2014 4.30  3.70 - 5.45 10e6/uL Final   . RDW 07/22/2014 18.1* 11.2 - 14.5 % Final  . lymph# 07/22/2014 1.2  0.9 - 3.3 10e3/uL Final  . MONO# 07/22/2014 1.4* 0.1 -  0.9 10e3/uL Final  . Eosinophils Absolute 07/22/2014 0.3  0.0 - 0.5 10e3/uL Final  . Basophils Absolute 07/22/2014 0.1  0.0 - 0.1 10e3/uL Final  . NEUT% 07/22/2014 75.8  38.4 - 76.8 % Final  . LYMPH% 07/22/2014 9.6* 14.0 - 49.7 % Final  . MONO% 07/22/2014 11.4  0.0 - 14.0 % Final  . EOS% 07/22/2014 2.3  0.0 - 7.0 % Final  . BASO% 07/22/2014 0.9  0.0 - 2.0 % Final  . Sodium 07/22/2014 138  136 - 145 mEq/L Final  . Potassium 07/22/2014 3.6  3.5 - 5.1 mEq/L Final  . Chloride 07/22/2014 97* 98 - 109 mEq/L Final  . CO2 07/22/2014 25  22 - 29 mEq/L Final  . Glucose 07/22/2014 107  70 - 140 mg/dl Final  . BUN 07/22/2014 7.0  7.0 - 26.0 mg/dL Final  . Creatinine 07/22/2014 0.8  0.6 - 1.1 mg/dL Final  . Total Bilirubin 07/22/2014 1.10  0.20 - 1.20 mg/dL Final  . Alkaline Phosphatase 07/22/2014 55  40 - 150 U/L Final  . AST 07/22/2014 15  5 - 34 U/L Final  . ALT 07/22/2014 7  0 - 55 U/L Final  . Total Protein 07/22/2014 7.3  6.4 - 8.3 g/dL Final  . Albumin 07/22/2014 2.3* 3.5 - 5.0 g/dL Final  . Calcium 07/22/2014 9.2  8.4 - 10.4 mg/dL Final  . Anion Gap 07/22/2014 16* 3 - 11 mEq/L Final  . EGFR 07/22/2014 89* >90 ml/min/1.73 m2 Final   eGFR is calculated using the CKD-EPI Creatinine Equation (2009)  . TSH 07/22/2014 0.646  0.308 - 3.960 m(IU)/L Final  Appointment on 07/19/2014  Component Date Value Ref Range Status  . WBC 07/19/2014 12.9* 3.9 - 10.3 10e3/uL Final  . NEUT# 07/19/2014 9.6* 1.5 - 6.5 10e3/uL Final  . HGB 07/19/2014 11.3* 11.6 - 15.9 g/dL Final  . HCT 07/19/2014 32.6* 34.8 - 46.6 % Final  . Platelets 07/19/2014 345  145 - 400 10e3/uL Final  . MCV 07/19/2014 74.8* 79.5 - 101.0 fL Final  . MCH 07/19/2014 25.9  25.1 - 34.0 pg Final  . MCHC 07/19/2014 34.7  31.5 - 36.0 g/dL Final  . RBC 07/19/2014 4.36  3.70 - 5.45 10e6/uL Final  . RDW 07/19/2014 17.4*  11.2 - 14.5 % Final  . lymph# 07/19/2014 1.3  0.9 - 3.3 10e3/uL Final  . MONO# 07/19/2014 1.6* 0.1 - 0.9 10e3/uL Final  . Eosinophils Absolute 07/19/2014 0.2  0.0 - 0.5 10e3/uL Final  . Basophils Absolute 07/19/2014 0.1  0.0 - 0.1 10e3/uL Final  . NEUT% 07/19/2014 74.7  38.4 - 76.8 % Final  . LYMPH% 07/19/2014 10.3* 14.0 - 49.7 % Final  . MONO% 07/19/2014 12.6  0.0 - 14.0 % Final  . EOS% 07/19/2014 1.9  0.0 - 7.0 % Final  . BASO% 07/19/2014 0.5  0.0 - 2.0 % Final  . nRBC 07/19/2014 0  0 - 0 % Final  . Sodium 07/19/2014 140  136 - 145 mEq/L Final  . Potassium 07/19/2014 3.0* 3.5 - 5.1 mEq/L Final  . Chloride 07/19/2014 97* 98 - 109 mEq/L Final  . CO2 07/19/2014 27  22 - 29 mEq/L Final  . Glucose 07/19/2014 110  70 - 140 mg/dl Final  . BUN 07/19/2014 8.3  7.0 - 26.0 mg/dL Final  . Creatinine 07/19/2014 0.7  0.6 - 1.1 mg/dL Final  . Total Bilirubin 07/19/2014 1.47* 0.20 - 1.20 mg/dL Final  . Alkaline Phosphatase 07/19/2014 59  40 - 150  U/L Final  . AST 07/19/2014 16  5 - 34 U/L Final  . ALT 07/19/2014 7  0 - 55 U/L Final  . Total Protein 07/19/2014 7.3  6.4 - 8.3 g/dL Final  . Albumin 07/19/2014 2.4* 3.5 - 5.0 g/dL Final  . Calcium 07/19/2014 9.2  8.4 - 10.4 mg/dL Final  . Anion Gap 07/19/2014 16* 3 - 11 mEq/L Final  . EGFR 07/19/2014 >90  >90 ml/min/1.73 m2 Final   eGFR is calculated using the CKD-EPI Creatinine Equation (2009)  . TSH 07/19/2014 0.473  0.308 - 3.960 m(IU)/L Final  Hospital Outpatient Visit on 07/18/2014  Component Date Value Ref Range Status  . Total protein, fluid 07/18/2014 4.4   Final   Comment: (NOTE) No normal range established for this test Results should be evaluated in conjunction with serum values Performed at [facility]   . Fluid Type-FTP 07/18/2014 Pleural, L   Final  . Fluid Type-FCT 07/18/2014 Pleural, L   Final  . Color, Fluid 07/18/2014 AMBER* YELLOW Final  . Appearance, Fluid 07/18/2014 HAZY* CLEAR Final  . WBC, Fluid 07/18/2014 877  0  - 1000 cu mm Final  . Neutrophil Count, Fluid 07/18/2014 8  0 - 25 % Final  . Lymphs, Fluid 07/18/2014 60   Final  . Monocyte-Macrophage-Serous Fluid 07/18/2014 31* 50 - 90 % Final  . Eos, Fluid 07/18/2014 1   Final  . Other Cells, Fluid 07/18/2014 OTHER CELLS UNIDENTIFIED; SEE CYTOLOGY REPORT   Final  . Specimen Description 07/18/2014 PLEURAL LEFT   Final  . Special Requests 07/18/2014 NONE   Final  . Gram Stain 07/18/2014    Final                   Value:RARE WBC PRESENT,BOTH PMN AND MONONUCLEAR NO ORGANISMS SEEN Performed at [facility]   . Culture 07/18/2014    Final                   Value:NO GROWTH 3 DAYS Performed at [facility]   . Report Status 07/18/2014 07/21/2014 FINAL   Final  . Glucose, Fluid 07/18/2014 80   Final   Comment: (NOTE) No normal range established for this test Results should be evaluated in conjunction with serum values Performed at [facility]   . Fluid Type-FGLU 07/18/2014 Pleural, L   Final  Appointment on 07/15/2014  Component Date Value Ref Range Status  . WBC 07/15/2014 11.6* 3.9 - 10.3 10e3/uL Final  . NEUT# 07/15/2014 9.1* 1.5 - 6.5 10e3/uL Final  . HGB 07/15/2014 10.9* 11.6 - 15.9 g/dL Final  . HCT 07/15/2014 31.5* 34.8 - 46.6 % Final  . Platelets 07/15/2014 403* 145 - 400 10e3/uL Final  . MCV 07/15/2014 74.6* 79.5 - 101.0 fL Final  . MCH 07/15/2014 25.8  25.1 - 34.0 pg Final  . MCHC 07/15/2014 34.6  31.5 - 36.0 g/dL Final  . RBC 07/15/2014 4.22  3.70 - 5.45 10e6/uL Final  . RDW 07/15/2014 17.2* 11.2 - 14.5 % Final  . lymph# 07/15/2014 0.8* 0.9 - 3.3 10e3/uL Final  . MONO# 07/15/2014 1.6* 0.1 - 0.9 10e3/uL Final  . Eosinophils Absolute 07/15/2014 0.1  0.0 - 0.5 10e3/uL Final  . Basophils Absolute 07/15/2014 0.0  0.0 - 0.1 10e3/uL Final  . NEUT% 07/15/2014 78.0* 38.4 - 76.8 % Final  . LYMPH% 07/15/2014 7.0* 14.0 - 49.7 % Final  . MONO% 07/15/2014 13.7  0.0 - 14.0 % Final  . EOS%  07/15/2014 1.0  0.0 - 7.0 % Final   . BASO% 07/15/2014 0.3  0.0 - 2.0 % Final  . nRBC 07/15/2014 0  0 - 0 % Final  . Sodium 07/15/2014 139  136 - 145 mEq/L Final  . Potassium 07/15/2014 2.7* 3.5 - 5.1 mEq/L Final  . Chloride 07/15/2014 93* 98 - 109 mEq/L Final  . CO2 07/15/2014 29  22 - 29 mEq/L Final  . Glucose 07/15/2014 111  70 - 140 mg/dl Final  . BUN 07/15/2014 9.1  7.0 - 26.0 mg/dL Final  . Creatinine 07/15/2014 0.7  0.6 - 1.1 mg/dL Final  . Total Bilirubin 07/15/2014 0.80  0.20 - 1.20 mg/dL Final  . Alkaline Phosphatase 07/15/2014 59  40 - 150 U/L Final  . AST 07/15/2014 11  5 - 34 U/L Final  . ALT 07/15/2014 8  0 - 55 U/L Final  . Total Protein 07/15/2014 7.3  6.4 - 8.3 g/dL Final  . Albumin 07/15/2014 2.4* 3.5 - 5.0 g/dL Final  . Calcium 07/15/2014 9.3  8.4 - 10.4 mg/dL Final  . Anion Gap 07/15/2014 17* 3 - 11 mEq/L Final  . EGFR 07/15/2014 >90  >90 ml/min/1.73 m2 Final   eGFR is calculated using the CKD-EPI Creatinine Equation (2009)  . Ferritin 07/15/2014 1,027* 9 - 269 ng/ml Final  . Iron 07/15/2014 13* 41 - 142 ug/dL Final  . TIBC 07/15/2014 161* 236 - 444 ug/dL Final  . UIBC 07/15/2014 148  120 - 384 ug/dL Final  . %SAT 07/15/2014 8* 21 - 57 % Final  Admission on 06/24/2014, Discharged on 06/27/2014  Component Date Value Ref Range Status  . Sodium 06/24/2014 140  135 - 145 mmol/L Final  . Potassium 06/24/2014 3.8  3.5 - 5.1 mmol/L Final  . Chloride 06/24/2014 100  96 - 112 mmol/L Final  . CO2 06/24/2014 30  19 - 32 mmol/L Final  . Glucose, Bld 06/24/2014 159* 70 - 99 mg/dL Final  . BUN 06/24/2014 10  6 - 23 mg/dL Final  . Creatinine, Ser 06/24/2014 0.85  0.50 - 1.10 mg/dL Final  . Calcium 06/24/2014 9.1  8.4 - 10.5 mg/dL Final  . Total Protein 06/24/2014 7.3  6.0 - 8.3 g/dL Final  . Albumin 06/24/2014 3.1* 3.5 - 5.2 g/dL Final  . AST 06/24/2014 16  0 - 37 U/L Final  . ALT 06/24/2014 29  0 - 35 U/L Final  . Alkaline Phosphatase 06/24/2014 110  39 - 117 U/L Final  . Total Bilirubin 06/24/2014 1.2   0.3 - 1.2 mg/dL Final  . GFR calc non Af Amer 06/24/2014 66* >90 mL/min Final  . GFR calc Af Amer 06/24/2014 77* >90 mL/min Final   Comment: (NOTE) The eGFR has been calculated using the CKD EPI equation. This calculation has not been validated in all clinical situations. eGFR's persistently <90 mL/min signify possible Chronic Kidney Disease.   . Anion gap 06/24/2014 10  5 - 15 Final  . Magnesium 06/24/2014 2.1  1.5 - 2.5 mg/dL Final  . Phosphorus 06/24/2014 2.9  2.3 - 4.6 mg/dL Final  . WBC 06/24/2014 9.9  4.0 - 10.5 K/uL Final  . RBC 06/24/2014 4.44  3.87 - 5.11 MIL/uL Final  . Hemoglobin 06/24/2014 11.5* 12.0 - 15.0 g/dL Final  . HCT 06/24/2014 33.2* 36.0 - 46.0 % Final  . MCV 06/24/2014 74.8* 78.0 - 100.0 fL Final  . MCH 06/24/2014 25.9* 26.0 - 34.0 pg Final  . MCHC 06/24/2014 34.6  30.0 -  36.0 g/dL Final  . RDW 06/24/2014 17.1* 11.5 - 15.5 % Final  . Platelets 06/24/2014 403* 150 - 400 K/uL Final  . aPTT 06/24/2014 37  24 - 37 seconds Final   Comment:        IF BASELINE aPTT IS ELEVATED, SUGGEST PATIENT RISK ASSESSMENT BE USED TO DETERMINE APPROPRIATE ANTICOAGULANT THERAPY.   . Prothrombin Time 06/24/2014 15.1  11.6 - 15.2 seconds Final  . INR 06/24/2014 1.18  0.00 - 1.49 Final  . Sodium 06/25/2014 139  135 - 145 mmol/L Final  . Potassium 06/25/2014 3.2* 3.5 - 5.1 mmol/L Final  . Chloride 06/25/2014 100  96 - 112 mmol/L Final  . CO2 06/25/2014 30  19 - 32 mmol/L Final  . Glucose, Bld 06/25/2014 126* 70 - 99 mg/dL Final  . BUN 06/25/2014 12  6 - 23 mg/dL Final  . Creatinine, Ser 06/25/2014 0.72  0.50 - 1.10 mg/dL Final  . Calcium 06/25/2014 8.4  8.4 - 10.5 mg/dL Final  . GFR calc non Af Amer 06/25/2014 83* >90 mL/min Final  . GFR calc Af Amer 06/25/2014 >90  >90 mL/min Final   Comment: (NOTE) The eGFR has been calculated using the CKD EPI equation. This calculation has not been validated in all clinical situations. eGFR's persistently <90 mL/min signify possible Chronic  Kidney Disease.   . Anion gap 06/25/2014 9  5 - 15 Final  . WBC 06/25/2014 9.9  4.0 - 10.5 K/uL Final  . RBC 06/25/2014 4.03  3.87 - 5.11 MIL/uL Final  . Hemoglobin 06/25/2014 10.3* 12.0 - 15.0 g/dL Final  . HCT 06/25/2014 30.3* 36.0 - 46.0 % Final  . MCV 06/25/2014 75.2* 78.0 - 100.0 fL Final  . MCH 06/25/2014 25.6* 26.0 - 34.0 pg Final  . MCHC 06/25/2014 34.0  30.0 - 36.0 g/dL Final  . RDW 06/25/2014 17.0* 11.5 - 15.5 % Final  . Platelets 06/25/2014 374  150 - 400 K/uL Final  . WBC 06/26/2014 8.9  4.0 - 10.5 K/uL Final  . RBC 06/26/2014 4.02  3.87 - 5.11 MIL/uL Final  . Hemoglobin 06/26/2014 10.3* 12.0 - 15.0 g/dL Final  . HCT 06/26/2014 30.3* 36.0 - 46.0 % Final  . MCV 06/26/2014 75.4* 78.0 - 100.0 fL Final  . MCH 06/26/2014 25.6* 26.0 - 34.0 pg Final  . MCHC 06/26/2014 34.0  30.0 - 36.0 g/dL Final  . RDW 06/26/2014 17.3* 11.5 - 15.5 % Final  . Platelets 06/26/2014 341  150 - 400 K/uL Final  . Sodium 06/26/2014 139  135 - 145 mmol/L Final  . Potassium 06/26/2014 3.1* 3.5 - 5.1 mmol/L Final  . Chloride 06/26/2014 105  96 - 112 mmol/L Final  . CO2 06/26/2014 31  19 - 32 mmol/L Final  . Glucose, Bld 06/26/2014 99  70 - 99 mg/dL Final  . BUN 06/26/2014 12  6 - 23 mg/dL Final  . Creatinine, Ser 06/26/2014 0.67  0.50 - 1.10 mg/dL Final  . Calcium 06/26/2014 8.2* 8.4 - 10.5 mg/dL Final  . GFR calc non Af Amer 06/26/2014 85* >90 mL/min Final  . GFR calc Af Amer 06/26/2014 >90  >90 mL/min Final   Comment: (NOTE) The eGFR has been calculated using the CKD EPI equation. This calculation has not been validated in all clinical situations. eGFR's persistently <90 mL/min signify possible Chronic Kidney Disease.   . Anion gap 06/26/2014 3* 5 - 15 Final  There may be more visits with results that are not included.   Assessment/Plan  ICD-9-CM ICD-10-CM   1. Cancer related pain 338.3 G89.3   2. 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 Protein-calorie malnutrition, severe 262 E43   4. Hypoxia 799.02 R09.02   5. Essential hypertension 401.9 I10   6. DVT (deep venous thrombosis), unspecified laterality 453.40 I82.409     --start fentanyl patch 25 mcg/hr TD q72hrs to improve pain and reduce anxiety/SOB  --cont other meds as ordered  --PT/OT as tolerated  --cont [place] O2 for SOB/hypoxia  --cont nutritional supplement BID  --palliative care following pt  --will follow  Tori Cupps S. Perlie Gold  [facility] [address] [place], [place] [address] [phone] Office (Wednesdays and Fridays 8 AM - 5 PM) [phone] Cell (Monday-Friday 8 AM - 5 PM)

## 2014-09-11 ENCOUNTER — Encounter: Payer: Self-pay | Admitting: Physician Assistant

## 2014-09-18 ENCOUNTER — Ambulatory Visit: Admission: RE | Admit: 2014-09-18 | Payer: Medicare Other | Source: Ambulatory Visit | Admitting: Radiation Oncology

## 2014-09-18 ENCOUNTER — Encounter: Payer: Self-pay | Admitting: Internal Medicine

## 2014-09-20 ENCOUNTER — Ambulatory Visit: Payer: Medicare Other

## 2014-09-22 DEATH — deceased

## 2014-09-27 ENCOUNTER — Encounter: Payer: Self-pay | Admitting: Radiation Therapy

## 2014-09-27 NOTE — Progress Notes (Signed)
Per obituary: Pt expired on Oct 12, 2014

## 2014-11-04 DIAGNOSIS — G893 Neoplasm related pain (acute) (chronic): Secondary | ICD-10-CM | POA: Insufficient documentation

## 2014-12-31 ENCOUNTER — Other Ambulatory Visit: Payer: Self-pay | Admitting: Hematology

## 2016-11-26 IMAGING — MR MR HEAD WO/W CM
10 of 13 series · 33 of 48 positions shown · IV contrast (multihance)
Comparison: CT head without contrast 04/18/2014.

CLINICAL DATA: Numbness left-sided face and left upper extremity.
Involuntary movements in the left upper extremity. Abnormal CT scan.

EXAM:
MRI HEAD WITHOUT AND WITH CONTRAST
TECHNIQUE: Multiplanar, multiecho pulse sequences of the brain and surrounding
structures were obtained without and with intravenous contrast.
CONTRAST:  20mL MULTIHANCE GADOBENATE DIMEGLUMINE 529 MG/ML IV SOLN

[Series 3: DWI · axial · 3.0mm · 1.09mm/px · z∈[-58,+65]mm · 8 of 84 slices shown (1 of 4)]
[im 1/84]
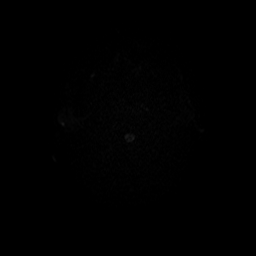
[im 12/84]
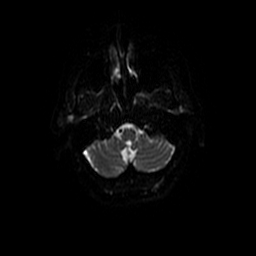
[im 24/84]
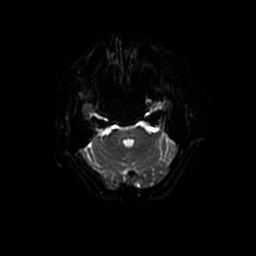
[im 36/84]
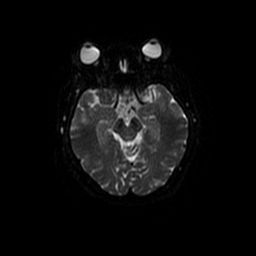
[im 48/84]
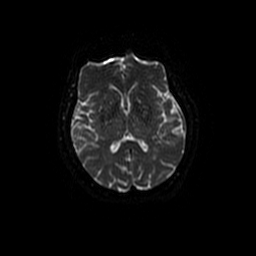
[im 60/84]
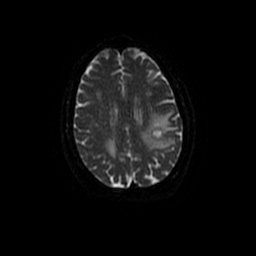
[im 72/84]
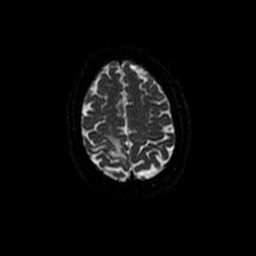
[im 84/84]
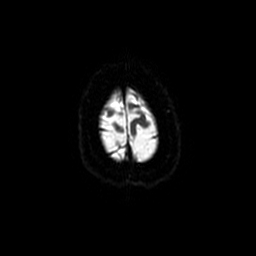

[Series 4: T1 · sagittal · 5.0mm · 0.47mm/px · 2 of 23 slices shown]
[im 1/23]
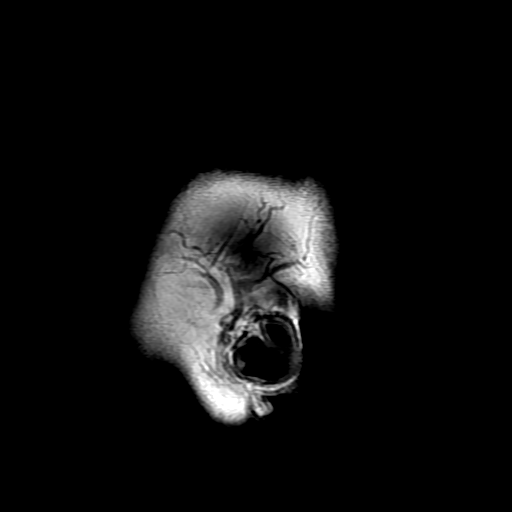
[im 23/23]
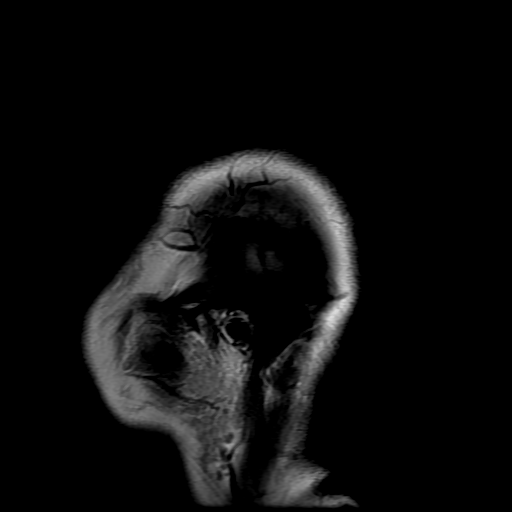

[Series 5: T2 · axial · 5.0mm · 0.43mm/px · z∈[-71,+67]mm · 2 of 24 slices shown]
[im 1/24]
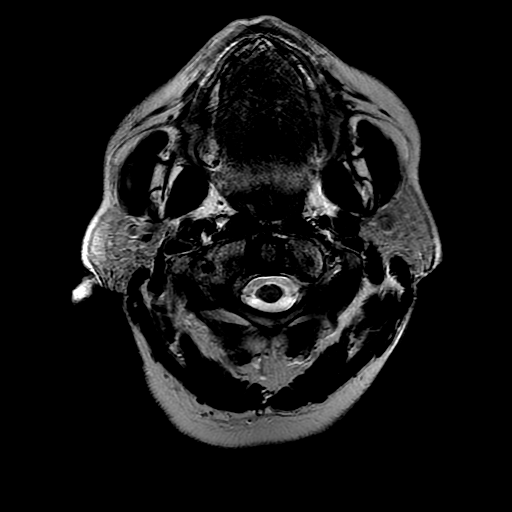
[im 24/24]
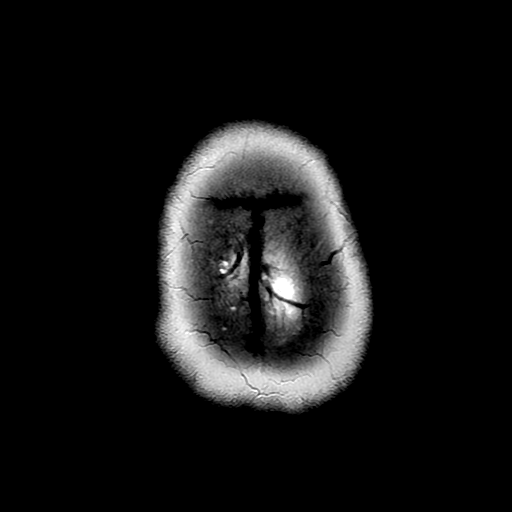

[Series 6: FLAIR · axial · 5.0mm · 0.43mm/px · z∈[-71,+67]mm · 2 of 24 slices shown]
[im 1/24]
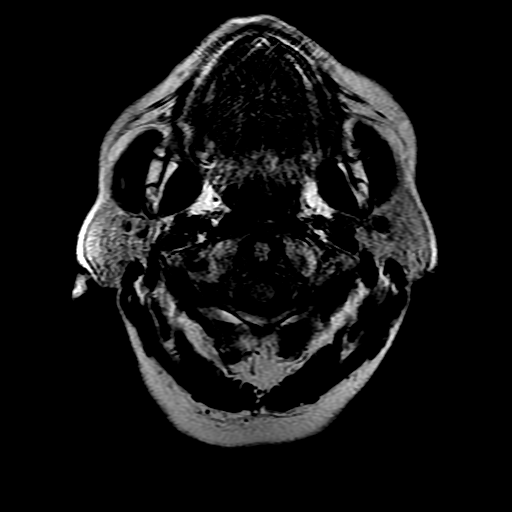
[im 24/24]
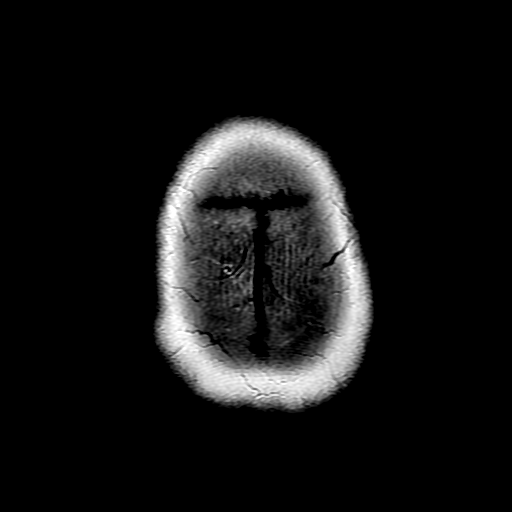

[Series 8: DWI · coronal · 5.0mm · 1.09mm/px · 6 of 66 slices shown (2 of 4)]
[im 1/66]
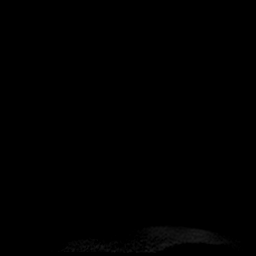
[im 14/66]
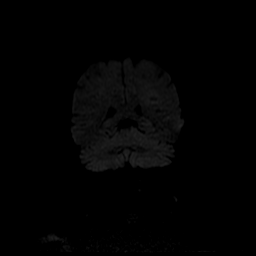
[im 27/66]
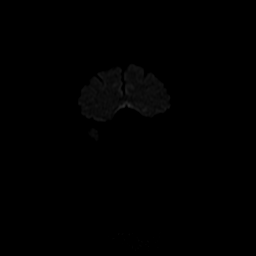
[im 40/66]
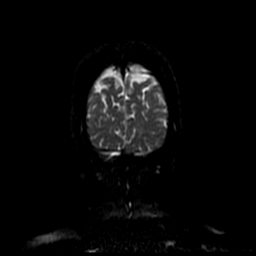
[im 53/66]
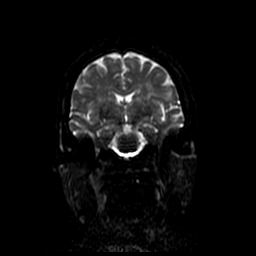
[im 66/66]
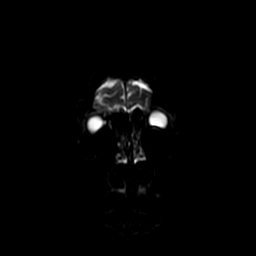

[Series 10: T2 post-contrast · coronal · 5.0mm · 0.39mm/px · 2 of 25 slices shown]
[im 1/25]
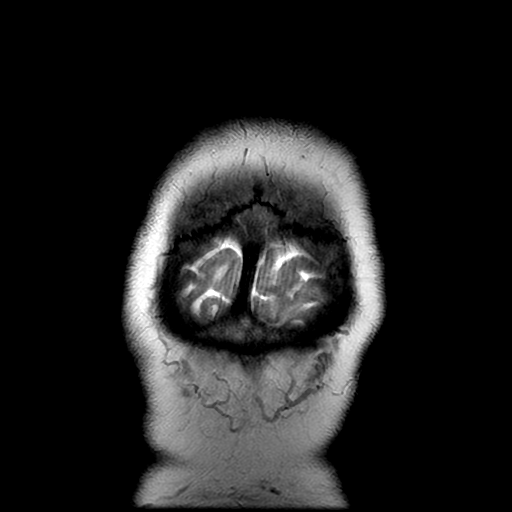
[im 25/25]
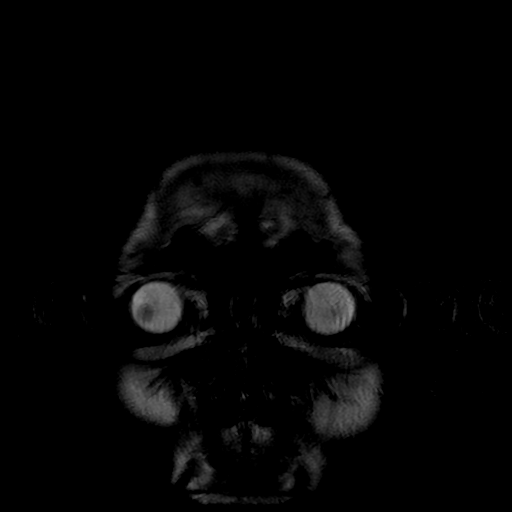

[Series 12: T1 post-contrast · coronal · 5.0mm · 0.39mm/px · 2 of 25 slices shown (1 of 2)]
[im 1/25]
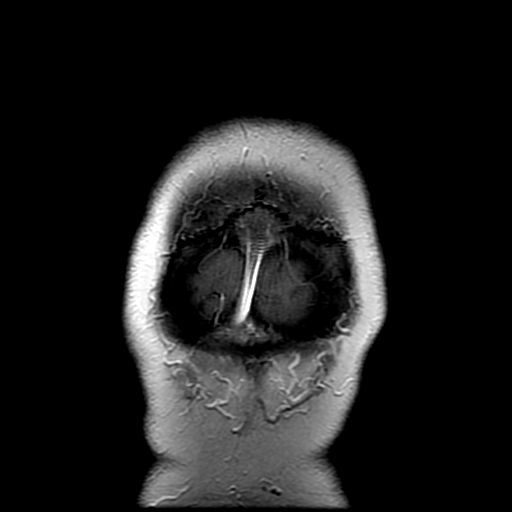
[im 25/25]
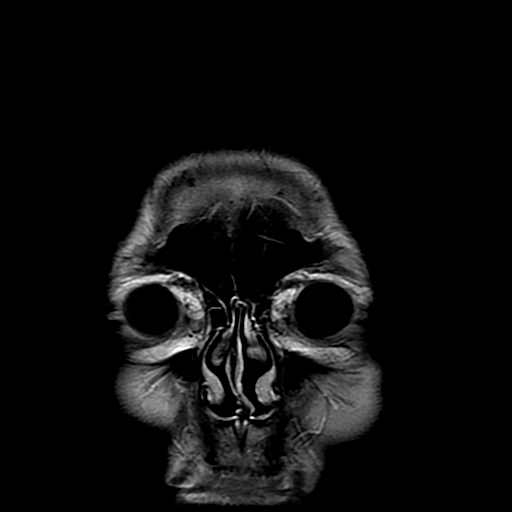

[Series 13: T1 post-contrast · sagittal · 5.0mm · 0.47mm/px · 2 of 23 slices shown (2 of 2)]
[im 1/23]
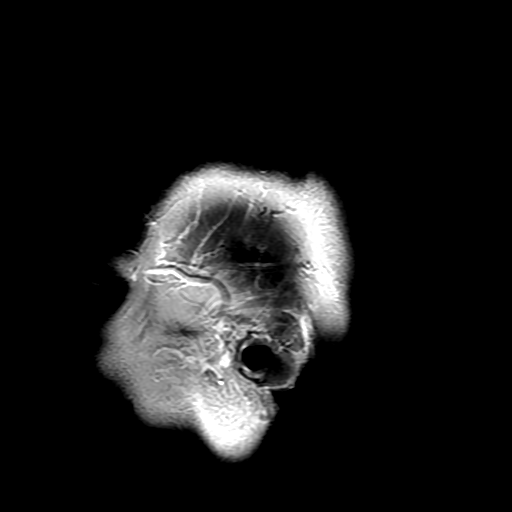
[im 23/23]
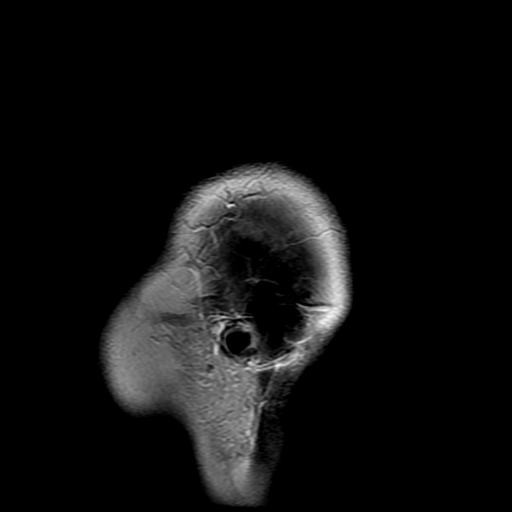

[Series 300: DWI · axial · 3.0mm · 1.09mm/px · z∈[-58,+65]mm · 4 of 42 slices shown (3 of 4)]
[im 1/42]
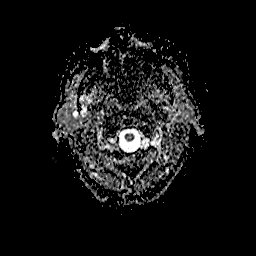
[im 14/42]
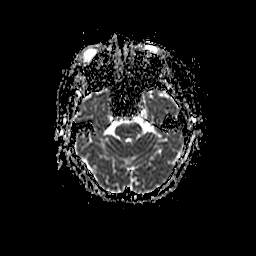
[im 28/42]
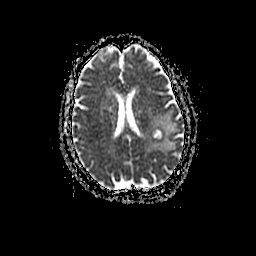
[im 42/42]
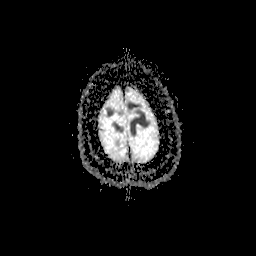

[Series 800: DWI · coronal · 5.0mm · 1.09mm/px · 3 of 33 slices shown (4 of 4)]
[im 1/33]
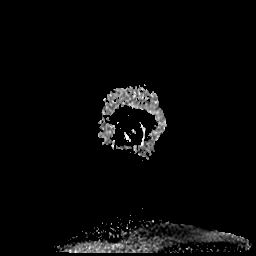
[im 17/33]
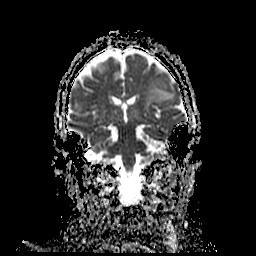
[im 33/33]
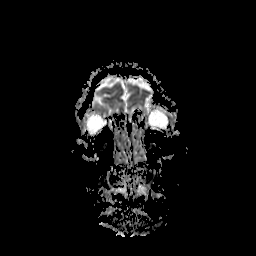

[33 of 48 positions shown; findings below may reference images not displayed]

FINDINGS: Enhancing mass lesion is present within the high right parietal
lobe, likely involving the cortex. The lesion measures 19 x 14 x 17
mm. There is significant surrounding vasogenic edema. There is some
hemorrhage associated with this lesion is well. Diffusion signal is
likely related to susceptibility in the hemorrhage.

A lesion in the posterior left frontal lobe white matter measures 15
x 16 x 13 mm with significant surrounding vasogenic edema. There is
no hemorrhage associated with this lesion.

Moderate generalized atrophy and periventricular white matter
changes are evident bilaterally. No significant midline shift is
present. The ventricles are of normal size. No significant
extraaxial fluid collection is present.

Flow is present in the major intracranial arteries. The globes and
orbits are intact. The paranasal sinuses and mastoid air cells are
clear.
IMPRESSION: 1. Hemorrhagic mass lesion in the high right parietal lobe measures
19 x 14 x 17 mm.
2. Enhancing mass lesion in the posterior left frontal lobe white
matter measures 15 x 16 x 13 mm.
3. There is significant surrounding vasogenic edema with both
lesions. These are most compatible with metastases.
4. Moderate generalized atrophy and diffuse white matter disease.
This likely reflects the sequelae of chronic microvascular ischemia.
